# Patient Record
Sex: Male | Born: 1945 | Race: White | Hispanic: No | Marital: Married | Smoking: Former smoker
Health system: Southern US, Community
[De-identification: ages and names within clinical notes are randomized; demographics above are authoritative.]

## PROBLEM LIST (undated history)

## (undated) DIAGNOSIS — K219 Gastro-esophageal reflux disease without esophagitis: Secondary | ICD-10-CM

## (undated) DIAGNOSIS — K760 Fatty (change of) liver, not elsewhere classified: Secondary | ICD-10-CM

## (undated) DIAGNOSIS — K5790 Diverticulosis of intestine, part unspecified, without perforation or abscess without bleeding: Secondary | ICD-10-CM

## (undated) DIAGNOSIS — D126 Benign neoplasm of colon, unspecified: Secondary | ICD-10-CM

## (undated) DIAGNOSIS — K8020 Calculus of gallbladder without cholecystitis without obstruction: Secondary | ICD-10-CM

## (undated) DIAGNOSIS — R160 Hepatomegaly, not elsewhere classified: Secondary | ICD-10-CM

## (undated) DIAGNOSIS — R55 Syncope and collapse: Secondary | ICD-10-CM

## (undated) DIAGNOSIS — Z8679 Personal history of other diseases of the circulatory system: Secondary | ICD-10-CM

## (undated) DIAGNOSIS — I38 Endocarditis, valve unspecified: Secondary | ICD-10-CM

## (undated) DIAGNOSIS — E785 Hyperlipidemia, unspecified: Secondary | ICD-10-CM

## (undated) DIAGNOSIS — E119 Type 2 diabetes mellitus without complications: Secondary | ICD-10-CM

## (undated) HISTORY — DX: Hyperlipidemia, unspecified: E78.5

## (undated) HISTORY — DX: Gastro-esophageal reflux disease without esophagitis: K21.9

## (undated) HISTORY — DX: Fatty (change of) liver, not elsewhere classified: K76.0

## (undated) HISTORY — PX: MOUTH SURGERY: SHX715

## (undated) HISTORY — DX: Type 2 diabetes mellitus without complications: E11.9

## (undated) HISTORY — DX: Endocarditis, valve unspecified: I38

## (undated) HISTORY — PX: ESOPHAGOGASTRODUODENOSCOPY: SHX1529

## (undated) HISTORY — DX: Syncope and collapse: R55

## (undated) HISTORY — DX: Calculus of gallbladder without cholecystitis without obstruction: K80.20

## (undated) HISTORY — PX: FINGER SURGERY: SHX640

## (undated) HISTORY — DX: Hepatomegaly, not elsewhere classified: R16.0

## (undated) HISTORY — DX: Benign neoplasm of colon, unspecified: D12.6

## (undated) HISTORY — DX: Personal history of other diseases of the circulatory system: Z86.79

## (undated) HISTORY — DX: Diverticulosis of intestine, part unspecified, without perforation or abscess without bleeding: K57.90

---

## 2006-02-04 ENCOUNTER — Encounter: Payer: Self-pay | Admitting: Internal Medicine

## 2009-02-27 ENCOUNTER — Encounter: Payer: Self-pay | Admitting: Internal Medicine

## 2009-02-27 LAB — HM COLONOSCOPY

## 2010-02-21 ENCOUNTER — Other Ambulatory Visit: Payer: Self-pay | Admitting: Internal Medicine

## 2010-02-21 ENCOUNTER — Ambulatory Visit
Admission: RE | Admit: 2010-02-21 | Discharge: 2010-02-21 | Payer: Self-pay | Source: Home / Self Care | Attending: Internal Medicine | Admitting: Internal Medicine

## 2010-02-21 ENCOUNTER — Encounter: Payer: Self-pay | Admitting: Internal Medicine

## 2010-02-21 DIAGNOSIS — I38 Endocarditis, valve unspecified: Secondary | ICD-10-CM | POA: Insufficient documentation

## 2010-02-21 DIAGNOSIS — E118 Type 2 diabetes mellitus with unspecified complications: Secondary | ICD-10-CM | POA: Insufficient documentation

## 2010-02-21 DIAGNOSIS — R55 Syncope and collapse: Secondary | ICD-10-CM | POA: Insufficient documentation

## 2010-02-21 DIAGNOSIS — E785 Hyperlipidemia, unspecified: Secondary | ICD-10-CM | POA: Insufficient documentation

## 2010-02-21 DIAGNOSIS — Z8679 Personal history of other diseases of the circulatory system: Secondary | ICD-10-CM | POA: Insufficient documentation

## 2010-02-21 DIAGNOSIS — K219 Gastro-esophageal reflux disease without esophagitis: Secondary | ICD-10-CM | POA: Insufficient documentation

## 2010-02-21 DIAGNOSIS — D126 Benign neoplasm of colon, unspecified: Secondary | ICD-10-CM | POA: Insufficient documentation

## 2010-02-21 LAB — BASIC METABOLIC PANEL
BUN: 15 mg/dL (ref 6–23)
CO2: 28 mEq/L (ref 19–32)
Calcium: 9.2 mg/dL (ref 8.4–10.5)
Chloride: 102 mEq/L (ref 96–112)
Creatinine, Ser: 1 mg/dL (ref 0.4–1.5)
GFR: 83.09 mL/min (ref >60.00)
Glucose, Bld: 137 mg/dL — ABNORMAL HIGH (ref 70–99)
Potassium: 4.5 mEq/L (ref 3.5–5.1)
Sodium: 138 mEq/L (ref 135–145)

## 2010-02-21 LAB — HEPATIC FUNCTION PANEL
ALT: 70 U/L — ABNORMAL HIGH (ref 0–53)
AST: 48 U/L — ABNORMAL HIGH (ref 0–37)
Albumin: 3.9 g/dL (ref 3.5–5.2)
Alkaline Phosphatase: 106 U/L (ref 39–117)
Bilirubin, Direct: 0.1 mg/dL (ref 0.0–0.3)
Total Bilirubin: 0.8 mg/dL (ref 0.3–1.2)
Total Protein: 6.7 g/dL (ref 6.0–8.3)

## 2010-02-21 LAB — CBC WITH DIFFERENTIAL/PLATELET
Basophils Absolute: 0.1 10*3/uL (ref 0.0–0.1)
Basophils Relative: 0.9 % (ref 0.0–3.0)
Eosinophils Absolute: 0.3 10*3/uL (ref 0.0–0.7)
Eosinophils Relative: 3 % (ref 0.0–5.0)
HCT: 47.6 % (ref 39.0–52.0)
Hemoglobin: 16.2 g/dL (ref 13.0–17.0)
Lymphocytes Relative: 33.1 % (ref 12.0–46.0)
Lymphs Abs: 3.1 10*3/uL (ref 0.7–4.0)
MCHC: 34 g/dL (ref 30.0–36.0)
MCV: 92.6 fl (ref 78.0–100.0)
Monocytes Absolute: 1 10*3/uL (ref 0.1–1.0)
Monocytes Relative: 10.7 % (ref 3.0–12.0)
Neutro Abs: 5 10*3/uL (ref 1.4–7.7)
Neutrophils Relative %: 52.3 % (ref 43.0–77.0)
Platelets: 145 10*3/uL — ABNORMAL LOW (ref 150.0–400.0)
RBC: 5.14 Mil/uL (ref 4.22–5.81)
RDW: 13.2 % (ref 11.5–14.6)
WBC: 9.5 10*3/uL (ref 4.5–10.5)

## 2010-02-21 LAB — LDL CHOLESTEROL, DIRECT: Direct LDL: 169.8 mg/dL

## 2010-02-21 LAB — HEMOGLOBIN A1C: Hgb A1c MFr Bld: 8.6 % — ABNORMAL HIGH (ref 4.6–6.5)

## 2010-02-21 LAB — LIPID PANEL
Cholesterol: 238 mg/dL — ABNORMAL HIGH (ref 0–200)
HDL: 48.2 mg/dL (ref >39.00)
Total CHOL/HDL Ratio: 5
Triglycerides: 129 mg/dL (ref 0.0–149.0)
VLDL: 25.8 mg/dL (ref 0.0–40.0)

## 2010-02-21 LAB — PSA: PSA: 0.57 ng/mL (ref 0.10–4.00)

## 2010-02-21 LAB — TSH: TSH: 1.52 u[IU]/mL (ref 0.35–5.50)

## 2010-02-25 ENCOUNTER — Telehealth (INDEPENDENT_AMBULATORY_CARE_PROVIDER_SITE_OTHER): Payer: Self-pay | Admitting: *Deleted

## 2010-03-07 ENCOUNTER — Ambulatory Visit
Admission: RE | Admit: 2010-03-07 | Discharge: 2010-03-07 | Payer: Self-pay | Source: Home / Self Care | Attending: Internal Medicine | Admitting: Internal Medicine

## 2010-03-20 NOTE — Progress Notes (Signed)
  Phone Note Other Incoming   Request: Send information Summary of Call: Records received from [facility]. 7 pages forwarded to Dr. Debby Bud for review.

## 2010-03-20 NOTE — Assessment & Plan Note (Signed)
Summary: NEW PT/SECURE HORIZONS/#/LB   Vital Signs:  Patient profile:   65 year old male Height:      73 inches Weight:      198 pounds BMI:     26.22 O2 Sat:      96 % on Room air Temp:     97.8 degrees F oral Pulse rate:   76 / minute BP sitting:   122 / 82  (left arm) Cuff size:   regular  Vitals Entered By: Charlynne Cousins CMA (February 21, 2010 1:38 PM)  O2 Flow:  Room air CC: new pt here to est care with primary/ ab  Vision Screening:      Vision Comments: last eye exam 2009 normal   Primary Care Provider:  Patient presents to establMichael E Verlon Pischke MD  CC:  new pt here to est care with primary/ ab.  History of Present Illness: ish for on-going continuity care. He has not active complaints at todays viisit.  He has been feeling well and doing well: no limitations in activity.   Preventive Screening-Counseling & Management  Alcohol-Tobacco     Alcohol drinks/day: <1     Alcohol type: beer     >5/day in last 3 mos: yes     Smoking Status: quit     Packs/Day: 1.0     Year Quit: 1980  Caffeine-Diet-Exercise     Caffeine use/day: 1 cup      Diet Comments: routine regular diet     Does Patient Exercise: no     Exercise Counseling: to improve exercise regimen  Hep-HIV-STD-Contraception     Hepatitis Risk: no risk noted     HIV Risk: no risk noted     STD Risk: no risk noted     Dental Visit-last 6 months no     TSE monthly: no     Sun Exposure-Excessive: no  Safety-Violence-Falls     Seat Belt Use: yes     Helmet Use: n/a     Firearms in the Home: no firearms in the home     Smoke Detectors: yes     Violence in the Home: no risk noted     Sexual Abuse: no     Fall Risk: low fall risk      Sexual History:  currently monogamous.        Drug Use:  never.        Blood Transfusions:  no.    Current Medications (verified): 1)  None  Allergies (verified): No Known Drug Allergies  Past History:  Past Medical History: UCD COLONIC POLYPS  (ICD-211.3) HEART VALVE DISEASE (ICD-424.90) RHEUMATIC FEVER, HX OF (ICD-V12.59) GERD (ICD-530.81) Hx of SYNCOPE AND COLLAPSE (ICD-780.2)   Physician Roster:                 GI--Dr.Rhodan (([facility])  Past Surgical History: none  Family History: Father - deceased @ 72: Anal cancer; CAD Mother- 45: CAD, AICD/pacer, DM,HTN, lipids Neg- prostate cancer MUncle - colon cancer 2nd degree - stomach cancer 2nd degree kin with DM  Social History: HS[employer], community college 2 years [employer] - 3 years, E5 at discharge [employer] '65 1 s0n'70 , 1 dtr '69; 2 grandchildren 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 Smoking Status:  quit Packs/Day:  1.0 Caffeine use/day:  1 cup  Does Patient Exercise:  no Dental Care w/in 6 mos.:  no Sun Exposure-Excessive:  no Seat Belt Use:  yes Fall Risk:  low fall risk  Blood Transfusions:  no Hepatitis Risk:  no risk noted HIV Risk:  no risk noted STD Risk:  no risk noted Sexual History:  currently monogamous Drug Use:  never  Review of Systems       The patient complains of decreased hearing.  The patient denies anorexia, fever, weight loss, weight gain, vision loss, hoarseness, chest pain, syncope, dyspnea on exertion, peripheral edema, headaches, abdominal pain, severe indigestion/heartburn, incontinence, muscle weakness, suspicious skin lesions, difficulty walking, depression, unusual weight change, abnormal bleeding, angioedema, and testicular masses.         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   Physical Exam  General:  Tall, alert, well-developed, well-nourished, well-hydrated, and healthy-appearing.   Head:  Normocephalic and atraumatic without obvious abnormalities. No apparent alopecia or balding. Eyes:  No corneal or conjunctival inflammation noted. EOMI. Perrla. Funduscopic exam benign, without hemorrhages, exudates or papilledema. Vision grossly normal. Ears:  External ear exam shows no  significant lesions or deformities.  Otoscopic examination reveals clear canals, tympanic membranes are intact bilaterally without bulging, retraction, inflammation or discharge. Hearing is grossly normal bilaterally. Nose:  no external deformity, no external erythema, and no nasal discharge.   Mouth:  no oral lesions, posterior pharynx clear Neck:  supple, full ROM, no thyromegaly, and no carotid bruits.   Chest Wall:  No deformities, masses, tenderness or gynecomastia noted. Lungs:  Normal respiratory effort, chest expands symmetrically. Lungs are clear to auscultation, no crackles or wheezes. Heart:  Normal rate and regular rhythm. S1 and S2 normal without gallop, murmur, click, rub or other extra sounds. Abdomen:  soft, non-tender, normal bowel sounds, no guarding, and no hepatomegaly.   Prostate:  deferred Msk:  normal ROM, no joint tenderness, no joint swelling, no joint warmth, no redness over joints, and no joint instability.   Pulses:  2+ radial pulse Extremities:  No clubbing, cyanosis, edema, or deformity noted with normal full range of motion of all joints.   Neurologic:  alert & oriented X3, cranial nerves II-XII intact, strength normal in all extremities, gait normal, and DTRs symmetrical and normal.   Skin:  turgor normal, color normal, no rashes, no suspicious lesions, and no ulcerations.   Cervical Nodes:  no anterior cervical adenopathy and no posterior cervical adenopathy.   Axillary Nodes:  no R axillary adenopathy and no L axillary adenopathy.   Inguinal Nodes:  no R inguinal adenopathy and no L inguinal adenopathy.   Psych:  Oriented X3, memory intact for recent and remote, normally interactive, good eye contact, and not anxious appearing.     Impression & Recommendations:  Problem # 1:  DIABETES MELLITUS, TYPE II, UNCONTROLLED (ICD-250.02) Patient had previously been told he was diabetic but he never undertook treatment.  Plan - A1c for diagnosis  Addendum - A1C 8.6%  - making diagnosis of diabetes with out of control blood sugar.  Plan - return office visit to discuss diagnosis and treatment.  Problem # 2:  HYPERLIPIDEMIA (NIO-270.4) No prior history of problem with cholesterol levels.   Plan - routine lab.  Addendum: LDL 169.8 with a goal of 130 in low risk patient, goal of 100 or less in diabetic patient  Plan - return office visit to discuss diagnosis and treatment options.   Problem # 3:  HEART VALVE DISEASE (ICD-424.90) Had diagnosis of rheumatic fever and heart murmur made while in service. He reports he was hospitalized for 6 weeks. He has never had any subsequent problems. No recollection of  2 D echo. On exam no murmur appreciated.  Plan - close observation with a low threshold for 2D echo.  Problem # 4:  COLONIC POLYPS (ICD-211.3) He believes his last study was normal.  Plan - obtain old records with recommendations to follow.   Problem # 5:  Preventive Health Care (ICD-V70.0) No active medical complaints at today's visit. Physical exam was normal. Lab reveals hyperglycemia and elevated AqC = diabetes; elevated cholesterol levels; mild elevaton in liver functions which will require further lab evaluation: chronic hepatitis panel. Colorectal cancer screeing - will review previous colonoscopies and determine interval for repeat study. Prostate cancer screening - PSA normal. Immuniations - requested patient to obtain old records with recommendations for immunizations to follow. 12 lead EKG normal with no signs of ischemia or injury.  In summary - a nice man establising for on-going care who does have a couple of problems that need to be addressed. He will return for follow up office visit.  Orders: Medicare -1st Annual Wellness Visit [phone]) TLB-CBC Platelet - w/Differential (85025-CBCD) TLB-BMP (Basic Metabolic Panel-BMET) (00938-HWEXHBZ) TLB-Lipid Panel (80061-LIPID) TLB-Hepatic/Liver Function Pnl (80076-HEPATIC) TLB-TSH (Thyroid  Stimulating Hormone) (84443-TSH) TLB-PSA (Prostate Specific Antigen) (84153-PSA) TLB-A1C / Hgb A1C (Glycohemoglobin) (83036-A1C)  Patient: Angel Rodgers Note: All result statuses are Final unless otherwise noted.  Tests: (1) CBC Platelet w/Diff (CBCD)   White Cell Count          9.5 K/uL                    4.5-10.5   Red Cell Count            5.14 Mil/uL                 4.22-5.81   Hemoglobin                16.2 g/dL                   13.0-17.0   Hematocrit                47.6 %                      39.0-52.0   MCV                       92.6 fl                     78.0-100.0   MCHC                      34.0 g/dL                   30.0-36.0   RDW                       13.2 %                      11.5-14.6   Platelet Count       [L]  145.0 K/uL                  150.0-400.0   Neutrophil %              52.3 %                      43.0-77.0   Lymphocyte %  33.1 %                      12.0-46.0   Monocyte %                10.7 %                      3.0-12.0   Eosinophils%              3.0 %                       0.0-5.0   Basophils %               0.9 %                       0.0-3.0   Neutrophill Absolute      5.0 K/uL                    1.4-7.7   Lymphocyte Absolute       3.1 K/uL                    0.7-4.0   Monocyte Absolute         1.0 K/uL                    0.1-1.0  Eosinophils, Absolute                             0.3 K/uL                    0.0-0.7   Basophils Absolute        0.1 K/uL                    0.0-0.1  Tests: (2) BMP (METABOL)   Sodium                    138 mEq/L                   135-145   Potassium                 4.5 mEq/L                   3.5-5.1   Chloride                  102 mEq/L                   96-112   Carbon Dioxide            28 mEq/L                    19-32   Glucose              [H]  137 mg/dL                   70-99   BUN                       15 mg/dL                    6-23   Creatinine  1.0 mg/dL                    0.4-1.5   Calcium                   9.2 mg/dL                   8.4-10.5   GFR                       83.09 mL/min                >60.00  Tests: (3) Lipid Panel (LIPID)   Cholesterol          [H]  238 mg/dL                   0-200     ATP III Classification            Desirable:  < 200 mg/dL                    Borderline High:  200 - 239 mg/dL               High:  > = 240 mg/dL   Triglycerides             129.0 mg/dL                 0.0-149.0     Normal:  <150 mg/dL     Borderline High:  150 - 199 mg/dL   HDL                       48.20 mg/dL                 >39.00   VLDL Cholesterol          25.8 mg/dL                  0.0-40.0  CHO/HDL Ratio:  CHD Risk                             5                    Men          Women     1/2 Average Risk     3.4          3.3     Average Risk          5.0          4.4     2X Average Risk          9.6          7.1     3X Average Risk          15.0          11.0                           Tests: (4) Hepatic/Liver Function Panel (HEPATIC)   Total Bilirubin           0.8 mg/dL                   0.3-1.2   Direct Bilirubin          0.1 mg/dL  0.0-0.3   Alkaline Phosphatase      106 U/L                     39-117   AST                  [H]  48 U/L                      0-37   ALT                  [H]  70 U/L                      0-53   Total Protein             6.7 g/dL                    6.0-8.3   Albumin                   3.9 g/dL                    3.5-5.2  Tests: (5) TSH (TSH)   FastTSH                   1.52 uIU/mL                 0.35-5.50  Tests: (6) Prostate Specific Antigen (PSA)   PSA-Hyb                   0.57 ng/mL                  0.10-4.00  Tests: (7) Hemoglobin A1C (A1C)   Hemoglobin A1C       [H]  8.6 %                       4.6-6.5     Glycemic Control Guidelines for People with Diabetes:     Non Diabetic:  <6%     Goal of Therapy: <7%     Additional Action Suggested:  >8%   Tests: (8) Cholesterol LDL - Direct  (DIRLDL)  Cholesterol LDL - Direct                             169.8 mg/dL     Optimal:  <100 mg/dL     Near or Above Optimal:  100-129 mg/dL     Borderline High:  130-159 mg/dL     High:  160-189 mg/dL     Very High:  >190 mg/dL  Orders Added: 1)  Medicare -1st Annual Wellness Visit [G0438] 2)  TLB-CBC Platelet - w/Differential [85025-CBCD] 3)  TLB-BMP (Basic Metabolic Panel-BMET) [93716-RCVELFY] 4)  TLB-Lipid Panel [80061-LIPID] 5)  TLB-Hepatic/Liver Function Pnl [80076-HEPATIC] 6)  TLB-TSH (Thyroid Stimulating Hormone) [84443-TSH] 7)  TLB-PSA (Prostate Specific Antigen) [84153-PSA] 8)  TLB-A1C / Hgb A1C (Glycohemoglobin) [83036-A1C]  Appended Document: NEW PT/SECURE HORIZONS/#/LB    Clinical Lists Changes  Orders: Added new Service order of Tdap => 33yr IM ((10175 - Signed Added new Service order of Admin 1st Vaccine ([id number] - Signed Observations: Added new observation of TD BOOST VIS: 01/04/08 version given March 07, 2010. (03/07/2010 11:04) Added new observation of TD BOOSTERLO: [id number] (03/07/2010 11:04) Added new observation of TD BOOST EXP: 12/06/2011 (03/07/2010 11:04) Added new  observation of TD BOOSTERBY: Ami Bullins CMA (03/07/2010 11:04) Added new observation of TD BOOSTERRT: IM (03/07/2010 11:04) Added new observation of TDBOOSTERDSE: 0.5 ml (03/07/2010 11:04) Added new observation of TD BOOSTERMF: GlaxoSmithKline (03/07/2010 11:04) Added new observation of TD BOOST SIT: left deltoid (03/07/2010 11:04) Added new observation of TD BOOSTER: Tdap (03/07/2010 11:04)       Immunizations Administered:  Tetanus Vaccine:    Vaccine Type: Tdap    Site: left deltoid    Mfr: GlaxoSmithKline    Dose: 0.5 ml    Route: IM    Given by: Ami Bullins CMA    Exp. Date: 12/06/2011    Lot #: [id number]    VIS given: 01/04/08 version given March 07, 2010.

## 2010-03-20 NOTE — Procedures (Signed)
Summary: Colonoscopy/[facility]  Colonoscopy/[facility]   Imported By: Phillis Knack 03/04/2010 08:42:09  _____________________________________________________________________  External Attachment:    Type:   Image     Comment:   External Document

## 2010-03-20 NOTE — Assessment & Plan Note (Signed)
Summary: discuss labs per flag/cd   Vital Signs:  Patient profile:   65 year old male Height:      73 inches Weight:      195 pounds BMI:     25.82 O2 Sat:      97 % on Room air Temp:     97.9 degrees F oral Pulse rate:   63 / minute BP sitting:   110 / 68  (left arm) Cuff size:   regular  Vitals Entered By: Ami Bullins CMA (March 07, 2010 11:02 AM)  O2 Flow:  Room air CC: office visit to discuss labs/ ab   Primary Care Provider:  Patient presents to establMichael E Sharyon Peitz MD  CC:  office visit to discuss labs/ ab.  History of Present Illness: Mr. Akamine was recenly seen as a new patient. The H&P was reviewed and he had no additions or corrections.   Patient returns for follow-up of abnormal labs. A1C 8.6%; LDL 169. He is feeling well and is asymptomatic.  Current Medications (verified): 1)  None  Allergies (verified): No Known Drug Allergies PMH-FH-SH reviewed-no changes except otherwise noted  Review of Systems Endo:  Denies cold intolerance, excessive hunger, excessive thirst, excessive urination, polyuria, and weight change.  Physical Exam  General:  alert, well-developed, well-nourished, and well-hydrated.   Lungs:  normal respiratory effort and normal breath sounds.   Heart:  normal rate and regular rhythm.   Psych:  Oriented X3, memory intact for recent and remote, normally interactive, and good eye contact.     Impression & Recommendations:  Problem # 1:  DIABETES MELLITUS, TYPE II, UNCONTROLLED (ICD-250.02) By definition, with an A1C greater than 7%, he is a diabetic. Discussed with him in detail. Reviewed mechanism of disease. Reviewed treatment options. He does admit to eating a lot of sugar.  Plan - by his preference - life style management: no sugar, low carb diet.           follow-up A1C in 3 months with treatment if not a goal of A1C 7% or less  Labs Reviewed: Creat: 1.0 (02/21/2010)    Reviewed HgBA1c results: 8.6 (02/21/2010)  Problem #  2:  HYPERLIPIDEMIA (ICD-272.4) Labs Reviewed: SGOT: 48 (02/21/2010)   SGPT: 70 (02/21/2010)   HDL:48.20 (02/21/2010)  Chol:238 (02/21/2010)  Trig:129.0 (02/21/2010) LDL 169 (02/21/2010)  Reviewed the NCEP guidelines: for a diabetic the goal is an LDL of less than 100. discussed the increased risk of cardiovascular disease.  Plan - by his preference a trial of lifestyle management: low fat diet and increased aerobic exercise.           repeat lipid profile in 3 months.    Orders Added: 1)  Est. Patient Level III [12248]

## 2010-03-20 NOTE — Procedures (Signed)
Summary: Colonoscopy/[facility]  Colonoscopy/[facility]   Imported By: Phillis Knack 03/04/2010 08:41:03  _____________________________________________________________________  External Attachment:    Type:   Image     Comment:   External Document

## 2010-06-09 ENCOUNTER — Other Ambulatory Visit: Payer: Self-pay

## 2010-06-09 ENCOUNTER — Other Ambulatory Visit: Payer: Self-pay | Admitting: Internal Medicine

## 2010-06-09 DIAGNOSIS — E785 Hyperlipidemia, unspecified: Secondary | ICD-10-CM

## 2010-06-09 DIAGNOSIS — E119 Type 2 diabetes mellitus without complications: Secondary | ICD-10-CM

## 2010-07-02 ENCOUNTER — Other Ambulatory Visit (INDEPENDENT_AMBULATORY_CARE_PROVIDER_SITE_OTHER): Payer: Self-pay

## 2010-07-02 DIAGNOSIS — E119 Type 2 diabetes mellitus without complications: Secondary | ICD-10-CM

## 2010-07-02 DIAGNOSIS — E785 Hyperlipidemia, unspecified: Secondary | ICD-10-CM

## 2010-07-02 LAB — LIPID PANEL
Cholesterol: 169 mg/dL (ref 0–200)
HDL: 47.4 mg/dL (ref >39.00)
LDL Cholesterol: 113 mg/dL — ABNORMAL HIGH (ref 0–99)
VLDL: 8.4 mg/dL (ref 0.0–40.0)

## 2010-07-07 ENCOUNTER — Encounter: Payer: Self-pay | Admitting: Internal Medicine

## 2010-07-11 ENCOUNTER — Encounter: Payer: Self-pay | Admitting: *Deleted

## 2010-07-15 ENCOUNTER — Ambulatory Visit (INDEPENDENT_AMBULATORY_CARE_PROVIDER_SITE_OTHER): Payer: Medicare Other | Admitting: Internal Medicine

## 2010-07-15 VITALS — BP 110/80 | HR 60 | Temp 98.1°F | Wt 194.0 lb

## 2010-07-15 DIAGNOSIS — IMO0001 Reserved for inherently not codable concepts without codable children: Secondary | ICD-10-CM

## 2010-07-15 MED ORDER — METFORMIN HCL 500 MG PO TABS: 500.0000 mg | ORAL_TABLET | Freq: Two times a day (BID) | ORAL | Status: DC

## 2010-07-15 NOTE — Patient Instructions (Signed)
Diabetes - A1C 8.6%, way too high. Plan - start metformin 560m once a day for 10 days then increase to twice a day. Check blood sugar only as needed, i.e. Feeling bad and needing to rule out low blood sugar. Return in 90 days for repeat A1C with a goal of 7% or less. Continue to follow a sugar free, low carb diet.

## 2010-07-15 NOTE — Progress Notes (Signed)
  Subjective:    Patient ID: Trevelle Mcgurn, male    DOB: 12-01-1945, 65 y.o.   MRN: [id number]  HPI Mr. Lotter recently seen as a new patient, please see report. In the interval he had lab work which revealed an LDL of 113 - close to goal of 100 or less and an AQ1C of 8.6%. He presents to discuss these results.  I have reviewed the patient's medical history in detail and updated the computerized patient record.    Review of Systems No change from recent visit    Objective:   Physical Exam Vitals noted WNWD WM in no distress Pul - normal respirations Cor - RRR       Assessment & Plan:  1. Hyperlipidemia - sub-optimal control.  Plan - diet management at this time  2. Diabetes- out of control on diet therapy alone.  Plan - metformin 575m qd x 10 then bid. Reassess A1C in 3 months.   (greater than 50% of 2o min  visit spent on education and counseling)

## 2010-12-26 ENCOUNTER — Other Ambulatory Visit (INDEPENDENT_AMBULATORY_CARE_PROVIDER_SITE_OTHER): Payer: Medicare Other

## 2010-12-26 ENCOUNTER — Ambulatory Visit (INDEPENDENT_AMBULATORY_CARE_PROVIDER_SITE_OTHER): Payer: Medicare Other | Admitting: Internal Medicine

## 2010-12-26 VITALS — BP 120/70 | HR 70 | Temp 97.5°F | Wt 191.0 lb

## 2010-12-26 LAB — COMPREHENSIVE METABOLIC PANEL
ALT: 59 U/L — ABNORMAL HIGH (ref 0–53)
AST: 38 U/L — ABNORMAL HIGH (ref 0–37)
CO2: 27 mEq/L (ref 19–32)
Calcium: 10 mg/dL (ref 8.4–10.5)
Chloride: 104 mEq/L (ref 96–112)
GFR: 85.97 mL/min (ref >60.00)
Sodium: 139 mEq/L (ref 135–145)
Total Protein: 7.3 g/dL (ref 6.0–8.3)

## 2010-12-26 LAB — HEMOGLOBIN A1C: Hgb A1c MFr Bld: 7.1 % — ABNORMAL HIGH (ref 4.6–6.5)

## 2010-12-26 NOTE — Progress Notes (Signed)
  Subjective:    Patient ID: Angel Rodgers, male    DOB: 1945/07/06, 65 y.o.   MRN: [id number]  HPI Patient returns for follow-up. He was last seen May 29th He had an elevated A1C and was to return for lab . He was directed by schedulers to have an office visit. No charge for visit.    Review of Systems     Objective:   Physical Exam        Assessment & Plan:

## 2010-12-28 ENCOUNTER — Encounter: Payer: Self-pay | Admitting: Internal Medicine

## 2011-06-09 ENCOUNTER — Telehealth: Payer: Self-pay | Admitting: Internal Medicine

## 2011-06-09 DIAGNOSIS — IMO0001 Reserved for inherently not codable concepts without codable children: Secondary | ICD-10-CM

## 2011-06-09 DIAGNOSIS — E785 Hyperlipidemia, unspecified: Secondary | ICD-10-CM

## 2011-06-09 NOTE — Telephone Encounter (Signed)
Orders entered for lipid, hepatic, A1C and Bmet

## 2011-06-09 NOTE — Telephone Encounter (Signed)
Pt's wife called to set up physicals.  Angel Rodgers is June 11.  She thinks he needs to come before then for his A1C.  Can he come for this now and do everything else in June.  Please call his cell or the home phone to let him know.

## 2011-06-16 NOTE — Telephone Encounter (Signed)
Pt is aware.  

## 2011-06-23 ENCOUNTER — Other Ambulatory Visit (INDEPENDENT_AMBULATORY_CARE_PROVIDER_SITE_OTHER): Payer: Medicare Other

## 2011-06-23 DIAGNOSIS — E785 Hyperlipidemia, unspecified: Secondary | ICD-10-CM

## 2011-06-23 DIAGNOSIS — IMO0001 Reserved for inherently not codable concepts without codable children: Secondary | ICD-10-CM

## 2011-06-23 LAB — COMPREHENSIVE METABOLIC PANEL
ALT: 37 U/L (ref 0–53)
Albumin: 3.6 g/dL (ref 3.5–5.2)
Alkaline Phosphatase: 69 U/L (ref 39–117)
CO2: 25 mEq/L (ref 19–32)
Glucose, Bld: 127 mg/dL — ABNORMAL HIGH (ref 70–99)
Potassium: 4.4 mEq/L (ref 3.5–5.1)
Sodium: 138 mEq/L (ref 135–145)
Total Bilirubin: 0.6 mg/dL (ref 0.3–1.2)
Total Protein: 6.8 g/dL (ref 6.0–8.3)

## 2011-06-23 LAB — HEPATIC FUNCTION PANEL
ALT: 37 U/L (ref 0–53)
AST: 28 U/L (ref 0–37)
Albumin: 3.6 g/dL (ref 3.5–5.2)
Total Bilirubin: 0.6 mg/dL (ref 0.3–1.2)
Total Protein: 6.8 g/dL (ref 6.0–8.3)

## 2011-06-23 LAB — LIPID PANEL
Total CHOL/HDL Ratio: 4
VLDL: 9.8 mg/dL (ref 0.0–40.0)

## 2011-06-23 LAB — HEMOGLOBIN A1C: Hgb A1c MFr Bld: 7.4 % — ABNORMAL HIGH (ref 4.6–6.5)

## 2011-07-01 ENCOUNTER — Other Ambulatory Visit: Payer: Self-pay | Admitting: Internal Medicine

## 2011-07-28 ENCOUNTER — Encounter: Payer: Self-pay | Admitting: Internal Medicine

## 2011-07-28 ENCOUNTER — Ambulatory Visit (INDEPENDENT_AMBULATORY_CARE_PROVIDER_SITE_OTHER): Payer: Medicare Other | Admitting: Internal Medicine

## 2011-07-28 VITALS — BP 118/70 | HR 80 | Temp 98.1°F | Resp 16 | Ht 72.0 in | Wt 190.0 lb

## 2011-07-28 DIAGNOSIS — Z0000 Encounter for general adult medical examination without abnormal findings: Secondary | ICD-10-CM

## 2011-07-28 DIAGNOSIS — I059 Rheumatic mitral valve disease, unspecified: Secondary | ICD-10-CM

## 2011-07-28 DIAGNOSIS — Z8679 Personal history of other diseases of the circulatory system: Secondary | ICD-10-CM

## 2011-07-28 DIAGNOSIS — E785 Hyperlipidemia, unspecified: Secondary | ICD-10-CM

## 2011-07-28 MED ORDER — ATORVASTATIN CALCIUM 20 MG PO TABS: 20.0000 mg | ORAL_TABLET | Freq: Every day | ORAL | Status: DC

## 2011-07-28 NOTE — Assessment & Plan Note (Signed)
Lab Results  Component Value Date   HGBA1C 7.4* 06/23/2011   Plan - Life-style management for next 3 months  Repeat A1C 3 months

## 2011-08-03 ENCOUNTER — Encounter: Payer: Self-pay | Admitting: Internal Medicine

## 2011-08-03 DIAGNOSIS — Z0000 Encounter for general adult medical examination without abnormal findings: Secondary | ICD-10-CM | POA: Insufficient documentation

## 2011-08-03 NOTE — Assessment & Plan Note (Signed)
LDL not a goal for a diabetic - LDL of 100 or less  Plan Life-style management with low fat diet and exercise.  Start medical therapy given the increased risk of cardiac disease. Lipitor 20 mg daily  Follow up lab in 4 weeks.

## 2011-08-03 NOTE — Assessment & Plan Note (Signed)
Mild murmur present - mitral area.  Plan 2D echo to assess valves. R/o rheumatic fever related mitral regurge.

## 2011-08-03 NOTE — Assessment & Plan Note (Signed)
Patient appears medically stable. Physical exam does reveal a tan mole with irregular edge on head/face that has been stable. Lab reveals suboptimal A1c and LDL cholesterol with patient preference for life-style management of diabetes before increasing medication and a willingness to start "statin" therapy with close follow-up. He is current for colorectal cancer screening. Discussed pros and cons of prostate cancer screening (USPHCTF recommendations reviewed and ACU April '13 recommendations) and he defers retesting at this time with a normal PSA in '12  In summary - a nice man who needs to be brought into better control in regard to diabetes and hyperlipidemia.

## 2011-08-03 NOTE — Progress Notes (Signed)
Subjective:    Patient ID: Angel Rodgers, male    DOB: 10-12-1945, 66 y.o.   MRN: [id number]  HPI The patient is here for annual Medicare wellness examination and management of other chronic and acute problems. He is follow for diabetes and hyperlipidemia.   The risk factors are reflected in the social history.  The roster of all physicians providing medical care to patient - is listed in the Snapshot section of the chart.  Activities of daily living:  The patient is 100% inedpendent in all ADLs: dressing, toileting, feeding as well as independent mobility  Home safety : The patient has smoke detectors in the home. They wear seatbelts.  firearms are present in the home, kept in a safe fashion. There is no violence in the home.   There is no risks for hepatitis, STDs or HIV. There is no   history of blood transfusion. They have no travel history to infectious disease endemic areas of the world.  The patient has seen their dentist in the last six month. They have seen their eye doctor in the last year. They deny  any hearing difficulty and have not had audiologic testing in the last year.  They do not  have excessive sun exposure. Discussed the need for sun protection: hats, long sleeves and use of sunscreen if there is significant sun exposure.   Diet: the importance of a healthy diet is discussed. They try to  have a healthy diet.  The patient has no regular exercise program.  The benefits of regular aerobic exercise were discussed.  Depression screen: there are no signs or vegative symptoms of depression- irritability, change in appetite, anhedonia, sadness/tearfullness.  Cognitive assessment: the patient manages all their financial and personal affairs and is actively engaged. .  The following portions of the patient's history were reviewed and updated as appropriate: allergies, current medications, past family history, past medical history,  past surgical history, past social history   and problem list.  Vision, hearing, body mass index were assessed and reviewed.   During the course of the visit the patient was educated and counseled about appropriate screening and preventive services including : fall prevention , diabetes screening, nutrition counseling, colorectal cancer screening, and recommended immunizations.  Past Medical History  Diagnosis Date  . Benign neoplasm of colon   . Endocarditis, valve unspecified, unspecified cause   . History of rheumatic fever   . GERD (gastroesophageal reflux disease)   . Syncope and collapse    No past surgical history on file. Family History  Problem Relation Age of Onset  . Colon cancer Maternal Uncle   . Coronary artery disease Mother     AICD/PACER  . Hypertension Mother   . Diabetes Mother   . Hyperlipidemia Mother   . Coronary artery disease Father     AICD Pacer  . Cancer Father     ANAL  . Prostate cancer Neg Hx   . Stomach cancer Other   . Diabetes Other    History   Social History  . Marital Status: Married    Spouse Name: N/A    Number of Children: 2  . Years of Education: 12   Occupational History  . driver    Social History Main Topics  . Smoking status: Former Research scientist (life sciences)  . Smokeless tobacco: Never Used  . Alcohol Use: Yes  . Drug Use: No  . Sexually Active: Not on file   Other Topics Concern  . Not on file  Social History Narrative   HSG, community college 2 years. [employer]-3 years, E5 at discharge. Married  '65. 1 son '70, 1 dtr '69; 2 grandchildren. Work- drove Theatre manager now retired   Current Outpatient Prescriptions on File Prior to Visit  Medication Sig Dispense Refill  . aspirin 81 MG EC tablet Take 81 mg by mouth daily.        . metFORMIN (GLUCOPHAGE) 500 MG tablet TAKE 1 TABLET (500 MG TOTAL) BY MOUTH 2 (TWO) TIMES DAILY WITH A MEAL.  60 tablet  5  . atorvastatin (LIPITOR) 20 MG tablet Take 1 tablet (20 mg total) by mouth daily.  30 tablet  3       Review of  Systems Constitutional:  Negative for fever, chills, activity change and unexpected weight change.  HEENT:  Negative for hearing loss, ear pain, congestion, neck stiffness and postnasal drip. Negative for sore throat or swallowing problems. Negative for dental complaints.   Eyes: Negative for vision loss or change in visual acuity.  Respiratory: Negative for chest tightness and wheezing. Negative for DOE.   Cardiovascular: Negative for chest pain or palpitations. No decreased exercise tolerance Gastrointestinal: No change in bowel habit. No bloating or gas. No reflux or indigestion Genitourinary: Negative for urgency, frequency, flank pain and difficulty urinating.  Musculoskeletal: Negative for myalgias, back pain, arthralgias and gait problem.  Neurological: Negative for dizziness, tremors, weakness and headaches.  Hematological: Negative for adenopathy.  Psychiatric/Behavioral: Negative for behavioral problems and dysphoric mood.       Objective:   Physical Exam Filed Vitals:   07/28/11 1451  BP: 118/70  Pulse: 80  Temp: 98.1 F (36.7 C)  Resp: 16   Wt Readings from Last 3 Encounters:  07/28/11 190 lb (86.183 kg)  12/26/10 191 lb (86.637 kg)  07/15/10 194 lb (87.998 kg)    Gen'l: Well nourished well developed male in no acute distress  HEENT: Head: Normocephalic and atraumatic. Right Ear: External ear normal. EAC/TM nl. Left Ear: External ear normal.  EAC/TM nl. Nose: Nose normal. Mouth/Throat: Oropharynx is clear and moist. Dentition - native, in good repair. No buccal or palatal lesions. Posterior pharynx clear. Eyes: Conjunctivae and sclera clear. EOM intact. Pupils are equal, round, and reactive to light. Right eye exhibits no discharge. Left eye exhibits no discharge. Neck: Normal range of motion. Neck supple. No JVD present. No tracheal deviation present. No thyromegaly present.  Cardiovascular: Normal rate, regular rhythm, no gallop, no friction rub, no murmur heard.       Quiet precordium. 2+ radial and DP pulses . No carotid bruits Pulmonary/Chest: Effort normal. No respiratory distress or increased WOB, no wheezes, no rales. No chest wall deformity or CVAT. Abdominal: Soft. Bowel sounds are normal in all quadrants. He exhibits no distension, no tenderness, no rebound or guarding, No heptosplenomegaly  Genitourinary:   Musculoskeletal: Normal range of motion. He exhibits no edema and no tenderness.       Small and large joints without redness, synovial thickening or deformity. Full range of motion preserved about all small, median and large joints.  Lymphadenopathy:    He has no cervical or supraclavicular adenopathy.  Neurological: He is alert and oriented to person, place, and time. CN II-XII intact. DTRs 2+ and symmetrical biceps, radial and patellar tendons. Cerebellar function normal with no tremor, rigidity, normal gait and station. Foot w/ normal sensation Skin: Skin is warm and dry. No rash noted. No erythema.  Psychiatric: He has a normal mood and affect. His  behavior is normal. Thought content normal.   Lab Results  Component Value Date   WBC 9.5 02/21/2010   HGB 16.2 02/21/2010   HCT 47.6 02/21/2010   PLT 145.0* 02/21/2010   GLUCOSE 127* 06/23/2011   CHOL 184 06/23/2011   TRIG 49.0 06/23/2011   HDL 51.60 06/23/2011   LDLDIRECT 169.8 02/21/2010   LDLCALC 123* 06/23/2011        ALT 37 06/23/2011   AST 28 06/23/2011        NA 138 06/23/2011   K 4.4 06/23/2011   CL 106 06/23/2011   CREATININE 0.9 06/23/2011   BUN 15 06/23/2011   CO2 25 06/23/2011   TSH 1.52 02/21/2010   PSA 0.57 02/21/2010   HGBA1C 7.4* 06/23/2011           Assessment & Plan:

## 2011-08-04 ENCOUNTER — Ambulatory Visit (HOSPITAL_COMMUNITY): Payer: Medicare Other | Attending: Cardiovascular Disease

## 2011-08-28 ENCOUNTER — Ambulatory Visit (HOSPITAL_COMMUNITY): Payer: Medicare Other | Attending: Cardiovascular Disease | Admitting: Radiology

## 2011-08-28 DIAGNOSIS — Z8679 Personal history of other diseases of the circulatory system: Secondary | ICD-10-CM

## 2011-08-28 DIAGNOSIS — E785 Hyperlipidemia, unspecified: Secondary | ICD-10-CM | POA: Insufficient documentation

## 2011-08-28 DIAGNOSIS — R0989 Other specified symptoms and signs involving the circulatory and respiratory systems: Secondary | ICD-10-CM | POA: Insufficient documentation

## 2011-08-28 DIAGNOSIS — Z87891 Personal history of nicotine dependence: Secondary | ICD-10-CM | POA: Insufficient documentation

## 2011-08-28 DIAGNOSIS — R0602 Shortness of breath: Secondary | ICD-10-CM

## 2011-08-28 DIAGNOSIS — I00 Rheumatic fever without heart involvement: Secondary | ICD-10-CM | POA: Insufficient documentation

## 2011-08-28 DIAGNOSIS — R0609 Other forms of dyspnea: Secondary | ICD-10-CM | POA: Insufficient documentation

## 2011-08-28 DIAGNOSIS — I059 Rheumatic mitral valve disease, unspecified: Secondary | ICD-10-CM

## 2011-08-28 DIAGNOSIS — E119 Type 2 diabetes mellitus without complications: Secondary | ICD-10-CM | POA: Insufficient documentation

## 2011-08-28 NOTE — Progress Notes (Signed)
Echocardiogram performed.  

## 2011-08-30 ENCOUNTER — Encounter: Payer: Self-pay | Admitting: Internal Medicine

## 2011-10-28 ENCOUNTER — Other Ambulatory Visit (INDEPENDENT_AMBULATORY_CARE_PROVIDER_SITE_OTHER): Payer: Medicare Other

## 2011-10-28 DIAGNOSIS — E785 Hyperlipidemia, unspecified: Secondary | ICD-10-CM

## 2011-10-28 LAB — LIPID PANEL
HDL: 54.6 mg/dL (ref >39.00)
Triglycerides: 42 mg/dL (ref 0.0–149.0)
VLDL: 8.4 mg/dL (ref 0.0–40.0)

## 2011-10-28 LAB — HEPATIC FUNCTION PANEL
ALT: 26 U/L (ref 0–53)
Albumin: 4.1 g/dL (ref 3.5–5.2)
Total Bilirubin: 0.7 mg/dL (ref 0.3–1.2)
Total Protein: 7 g/dL (ref 6.0–8.3)

## 2011-10-28 LAB — HEMOGLOBIN A1C: Hgb A1c MFr Bld: 7 % — ABNORMAL HIGH (ref 4.6–6.5)

## 2011-10-30 ENCOUNTER — Encounter: Payer: Self-pay | Admitting: Internal Medicine

## 2011-10-30 ENCOUNTER — Other Ambulatory Visit: Payer: Self-pay | Admitting: Internal Medicine

## 2011-10-30 DIAGNOSIS — E785 Hyperlipidemia, unspecified: Secondary | ICD-10-CM

## 2011-10-30 MED ORDER — ATORVASTATIN CALCIUM 20 MG PO TABS: 40.0000 mg | ORAL_TABLET | Freq: Every day | ORAL | Status: DC

## 2012-01-12 ENCOUNTER — Other Ambulatory Visit (INDEPENDENT_AMBULATORY_CARE_PROVIDER_SITE_OTHER): Payer: Medicare Other

## 2012-01-12 DIAGNOSIS — E785 Hyperlipidemia, unspecified: Secondary | ICD-10-CM

## 2012-01-12 LAB — LIPID PANEL
Cholesterol: 156 mg/dL (ref 0–200)
HDL: 50.1 mg/dL (ref >39.00)
VLDL: 15.4 mg/dL (ref 0.0–40.0)

## 2012-01-18 ENCOUNTER — Encounter: Payer: Self-pay | Admitting: Internal Medicine

## 2012-01-23 ENCOUNTER — Other Ambulatory Visit: Payer: Self-pay | Admitting: Internal Medicine

## 2012-01-25 ENCOUNTER — Other Ambulatory Visit: Payer: Self-pay | Admitting: *Deleted

## 2012-01-25 MED ORDER — METFORMIN HCL 500 MG PO TABS: 500.0000 mg | ORAL_TABLET | Freq: Two times a day (BID) | ORAL | Status: DC

## 2012-03-05 ENCOUNTER — Other Ambulatory Visit: Payer: Self-pay | Admitting: Internal Medicine

## 2012-03-07 ENCOUNTER — Other Ambulatory Visit: Payer: Self-pay | Admitting: *Deleted

## 2012-03-07 DIAGNOSIS — E785 Hyperlipidemia, unspecified: Secondary | ICD-10-CM

## 2012-03-07 MED ORDER — ATORVASTATIN CALCIUM 20 MG PO TABS: 40.0000 mg | ORAL_TABLET | Freq: Every day | ORAL | Status: DC

## 2012-03-14 ENCOUNTER — Telehealth: Payer: Self-pay | Admitting: *Deleted

## 2012-03-14 NOTE — Telephone Encounter (Signed)
PATIENT STATES HIS INSURANCE COMPANY SAYS THEY WILL ONLY PAY FOR ATORVASTATIN 20MG  ONCE A DAY NOT TWO A DAY. PATIENT STATES HE HAS NEVER INCREASED HIS MEDICATION AS SUGGESTED IN LAB RESULT LETTER IN September , 2013. PLEASE CHANGE DOSE AGE INSTRUCTIONS TO 20MG  ONE TIME A DAY. PATIENT STATES THAT IS ALL HE IS EVER GOING TO TAKE REGARDLESS. CB # [phone]. LAST REFILL Rx WAS SENT IN FOR TWO A DAY OF ATORVASTATIN.

## 2012-03-14 NOTE — Telephone Encounter (Signed)
Reviewed chart: Sept '13 - LDL 132, way above goal of 100 or less. Recommended increase in dose of lipitor to 29m ( it should not have been sent in as 20 mg 2 tablets daily).  Last LDL Dec '13 was 956- if this was on 20 mg of lipitor daily then will renew Rx for the 20 mg strength. If it was on 40 mg will send in new Rx for 40 mg tablet.

## 2012-03-15 MED ORDER — ATORVASTATIN CALCIUM 20 MG PO TABS: 20.0000 mg | ORAL_TABLET | Freq: Every day | ORAL | Status: DC

## 2012-03-15 NOTE — Telephone Encounter (Signed)
Patient notified of Lipitor dose to pharmacy  One 20 mg tab daily. Patient states again he never increase this .

## 2012-08-29 ENCOUNTER — Telehealth: Payer: Self-pay

## 2012-08-29 ENCOUNTER — Other Ambulatory Visit (INDEPENDENT_AMBULATORY_CARE_PROVIDER_SITE_OTHER): Payer: Medicare Other

## 2012-08-29 NOTE — Telephone Encounter (Signed)
Patient requesting order for a1c. This was done.

## 2012-08-31 ENCOUNTER — Telehealth: Payer: Self-pay

## 2012-08-31 NOTE — Telephone Encounter (Signed)
Patient notified. He did not have any questions or concerns.

## 2012-08-31 NOTE — Telephone Encounter (Signed)
Message copied by Noreene Larsson on Wed Aug 31, 2012  8:24 AM ------      Message from: Jacques Navy      Created: Tue Aug 30, 2012 10:39 PM       Please call patient: A1C 7.4% slightly above goal of 7%. No change in medication, stricter diet. ------

## 2012-10-25 ENCOUNTER — Ambulatory Visit (INDEPENDENT_AMBULATORY_CARE_PROVIDER_SITE_OTHER): Payer: Medicare Other | Admitting: Internal Medicine

## 2012-10-25 ENCOUNTER — Encounter: Payer: Self-pay | Admitting: Internal Medicine

## 2012-10-25 VITALS — BP 130/80 | HR 58 | Temp 97.0°F | Ht 72.0 in | Wt 188.0 lb

## 2012-10-25 DIAGNOSIS — Z23 Encounter for immunization: Secondary | ICD-10-CM

## 2012-10-25 DIAGNOSIS — Z125 Encounter for screening for malignant neoplasm of prostate: Secondary | ICD-10-CM

## 2012-10-25 DIAGNOSIS — E785 Hyperlipidemia, unspecified: Secondary | ICD-10-CM

## 2012-10-25 DIAGNOSIS — R202 Paresthesia of skin: Secondary | ICD-10-CM

## 2012-10-25 DIAGNOSIS — R209 Unspecified disturbances of skin sensation: Secondary | ICD-10-CM

## 2012-10-25 DIAGNOSIS — K219 Gastro-esophageal reflux disease without esophagitis: Secondary | ICD-10-CM

## 2012-10-25 DIAGNOSIS — Z0000 Encounter for general adult medical examination without abnormal findings: Secondary | ICD-10-CM

## 2012-10-25 NOTE — Progress Notes (Signed)
Subjective:    Patient ID: Angel Rodgers, male    DOB: May 17, 1945, 67 y.o.   MRN: [id number]  HPI The patient is here for annual Medicare wellness examination and management of other chronic and acute problems.  Interval history no major medical illness, no surgery and no injury.    The risk factors are reflected in the social history.  The roster of all physicians providing medical care to patient - is listed in the Snapshot section of the chart.  Activities of daily living:  The patient is 100% inedpendent in all ADLs: dressing, toileting, feeding as well as independent mobility  Home safety : The patient has smoke detectors in the home. Falls - none.    They wear seatbelts.  firearms are present in the home, kept in a safe fashion. There is no violence in the home.   There is no risks for hepatitis, STDs or HIV. There is no history of blood transfusion. They have no travel history to infectious disease endemic areas of the world.  The patient has not seen their dentist in the last six month- has upper dentures. They have not seen their eye doctor in the last year. They deny any hearing difficulty and have not had audiologic testing in the last year.    They do not  have excessive sun exposure. Discussed the need for sun protection: hats, long sleeves and use of sunscreen if there is significant sun exposure.   Diet: the importance of a healthy diet is discussed. They do have a healthy diet.  The patient has no regular exercise program.  The benefits of regular aerobic exercise were discussed.  Depression screen: there are no signs or vegative symptoms of depression- irritability, change in appetite, anhedonia, sadness/tearfullness.  Cognitive assessment: the patient manages all their financial and personal affairs and is actively engaged.   The following portions of the patient's history were reviewed and updated as appropriate: allergies, current medications, past family history,  past medical history,  past surgical history, past social history  and problem list.  Past Medical History  Diagnosis Date  . Benign neoplasm of colon   . Endocarditis, valve unspecified, unspecified cause   . History of rheumatic fever   . GERD (gastroesophageal reflux disease)   . Syncope and collapse    History reviewed. No pertinent past surgical history. Family History  Problem Relation Age of Onset  . Colon cancer Maternal Uncle   . Coronary artery disease Mother     AICD/PACER  . Hypertension Mother   . Diabetes Mother   . Hyperlipidemia Mother   . Coronary artery disease Father     AICD Pacer  . Cancer Father     ANAL  . Prostate cancer Neg Hx   . Stomach cancer Other   . Diabetes Other    History   Social History  . Marital Status: Married    Spouse Name: N/A    Number of Children: 2  . Years of Education: 12   Occupational History  . driver    Social History Main Topics  . Smoking status: Former Research scientist (life sciences)  . Smokeless tobacco: Never Used  . Alcohol Use: Yes  . Drug Use: No  . Sexual Activity: Not on file   Other Topics Concern  . Not on file   Social History Narrative   HSG, community college 2 years. [employer]-3 years, E5 at discharge. Married  '65. 1 son '70, 1 dtr '69; 2 grandchildren. Work- drove Theatre manager  now retired    Current Outpatient Prescriptions on File Prior to Visit  Medication Sig Dispense Refill  . atorvastatin (LIPITOR) 20 MG tablet Take 1 tablet (20 mg total) by mouth daily.  30 tablet  6  . metFORMIN (GLUCOPHAGE) 500 MG tablet Take 1 tablet (500 mg total) by mouth 2 (two) times daily with a meal.  60 tablet  5   No current facility-administered medications on file prior to visit.     Vision, hearing, body mass index were assessed and reviewed.   During the course of the visit the patient was educated and counseled about appropriate screening and preventive services including : fall prevention , diabetes screening, nutrition  counseling, colorectal cancer screening, and recommended immunizations.    Review of Systems Constitutional:  Negative for fever, chills, activity change and unexpected weight change.  HEENT:  Negative for hearing loss, ear pain, congestion, neck stiffness and postnasal drip. Negative for sore throat or swallowing problems. Negative for dental complaints.   Eyes: Negative for vision loss or change in visual acuity.  Respiratory: Negative for chest tightness and wheezing. Negative for DOE.   Cardiovascular: Negative for chest pain or palpitations. No decreased exercise tolerance Gastrointestinal: No change in bowel habit. No bloating or gas. No reflux or indigestion Genitourinary: Negative for urgency, frequency, flank pain and difficulty urinating.  Musculoskeletal: Negative for myalgias, back pain, arthralgias and gait problem.  Neurological: Negative for dizziness, tremors, weakness and headaches.  Hematological: Negative for adenopathy.  Psychiatric/Behavioral: Negative for behavioral problems and dysphoric mood.       Objective:   Physical Exam Filed Vitals:   10/25/12 0847  BP: 130/80  Pulse: 58  Temp: 97 F (36.1 C)   Wt Readings from Last 3 Encounters:  10/25/12 188 lb (85.276 kg)  07/28/11 190 lb (86.183 kg)  12/26/10 191 lb (86.637 kg)   Gen'l: Well nourished well developed male in no acute distress  HEENT: Head: Normocephalic and atraumatic. Right Ear: External ear normal. EAC/TM nl. Left Ear: External ear normal.  EAC/TM nl. Nose: Nose normal. Mouth/Throat: Oropharynx is clear and moist. Dentition - full upper denture, native mandible, in good repair. No buccal or lesions. Posterior pharynx clear. Eyes: Conjunctivae and sclera clear. EOM intact. Pupils are equal, round, and reactive to light. Right eye exhibits no discharge. Left eye exhibits no discharge. Neck: Normal range of motion. Neck supple. No JVD present. No tracheal deviation present. No thyromegaly present.   Cardiovascular: Normal rate, regular rhythm, no gallop, no friction rub, no murmur heard.      Quiet precordium. 2+ radial and DP pulses . No carotid bruits Pulmonary/Chest: Effort normal. No respiratory distress or increased WOB, no wheezes, no rales. No chest wall deformity or CVAT. Abdomen: Soft. Bowel sounds are normal in all quadrants. He exhibits no distension, no tenderness, no rebound or guarding, No heptosplenomegaly  Genitourinary:  deferred to PSA Musculoskeletal: Normal range of motion. He exhibits no edema and no tenderness.       Small and large joints without redness, synovial thickening or deformity. Full range of motion preserved about all small, median and large joints.  Lymphadenopathy:    He has no cervical or supraclavicular adenopathy.  Neurological: He is alert and oriented to person, place, and time. CN II-XII intact. DTRs 2+ and symmetrical biceps, radial and patellar tendons. Cerebellar function normal with no tremor, rigidity, normal gait and station. see foot exam Skin: Skin is warm and dry. No rash noted. No erythema.  Psychiatric:  He has a normal mood and affect. His behavior is normal. Thought content normal.   Recent Results (from the past 2160 hour(s))  HEMOGLOBIN A1C     Status: Abnormal   Collection Time    08/29/12  9:12 AM      Result Value Range   Hemoglobin A1C 7.4 (*) 4.6 - 6.5 %   Comment: Glycemic Control Guidelines for People with Diabetes:Non Diabetic:  <6%Goal of Therapy: <7%Additional Action Suggested:  >8%          Assessment & Plan:  Paresthesia - distal LE: decreased deep vibe sensation. Diabetic vs metabolic etiology Plan Will add B12 to current labs

## 2012-10-25 NOTE — Patient Instructions (Addendum)
Good to see you. Your exam is normal except for decreased vibratory sensation in the feet.  Lab is ordered as above  Think about taking up a aerobic exercise program, e.g. Walking, for 30 minutes 3 times a week to get a heart rate of 120+.  Please return for an A1C April '15.

## 2012-10-26 NOTE — Assessment & Plan Note (Signed)
No active symptoms reported at this time.  Plan Continue present treatment

## 2012-10-26 NOTE — Assessment & Plan Note (Addendum)
Lab Results  Component Value Date   HGBA1C 7.4* 08/29/2012   No change in medication at this time.   Plan Better life style adherence: diet and exercise  He is advised to have annual eye exam with report to [place].  Follow up lab in Oct '14

## 2012-10-26 NOTE — Assessment & Plan Note (Signed)
Interval history is benign. Physical exam is normal. He is current with colonoscopy. Discussed pros and cons of prostate cancer screening (USPHCTF recommendations reviewed and ACU April '13 recommendations) and he requests evaluation at this time. Immunizations are current except for shingles vaccine - he will check coverage.  In summary - a nice man who is medically stable. Follow up labs in October '14.  Return OV/CPE 1 year.

## 2012-10-26 NOTE — Assessment & Plan Note (Signed)
Taking and tolerating medication. Last LDL 169 - way above goal, but this was prior to initiating medical therapy.  Plan Follow up lipid panel October '14 with recommendations to follow to attain goal of 100 or less

## 2012-12-06 ENCOUNTER — Other Ambulatory Visit (INDEPENDENT_AMBULATORY_CARE_PROVIDER_SITE_OTHER): Payer: Medicare Other

## 2012-12-06 DIAGNOSIS — R202 Paresthesia of skin: Secondary | ICD-10-CM

## 2012-12-06 DIAGNOSIS — Z125 Encounter for screening for malignant neoplasm of prostate: Secondary | ICD-10-CM

## 2012-12-06 DIAGNOSIS — IMO0001 Reserved for inherently not codable concepts without codable children: Secondary | ICD-10-CM

## 2012-12-06 DIAGNOSIS — E785 Hyperlipidemia, unspecified: Secondary | ICD-10-CM

## 2012-12-06 DIAGNOSIS — R209 Unspecified disturbances of skin sensation: Secondary | ICD-10-CM

## 2012-12-06 LAB — HEPATIC FUNCTION PANEL
AST: 18 U/L (ref 0–37)
Albumin: 3.9 g/dL (ref 3.5–5.2)
Alkaline Phosphatase: 79 U/L (ref 39–117)
Total Bilirubin: 0.9 mg/dL (ref 0.3–1.2)

## 2012-12-06 LAB — COMPREHENSIVE METABOLIC PANEL
ALT: 23 U/L (ref 0–53)
BUN: 18 mg/dL (ref 6–23)
CO2: 27 mEq/L (ref 19–32)
Calcium: 9.1 mg/dL (ref 8.4–10.5)
Chloride: 107 mEq/L (ref 96–112)
Creatinine, Ser: 0.9 mg/dL (ref 0.4–1.5)
GFR: 91.11 mL/min (ref >60.00)
Glucose, Bld: 127 mg/dL — ABNORMAL HIGH (ref 70–99)
Total Bilirubin: 0.9 mg/dL (ref 0.3–1.2)

## 2012-12-06 LAB — LIPID PANEL
HDL: 42.1 mg/dL (ref >39.00)
LDL Cholesterol: 65 mg/dL (ref 0–99)
VLDL: 5.8 mg/dL (ref 0.0–40.0)

## 2012-12-06 LAB — TSH: TSH: 1.33 u[IU]/mL (ref 0.35–5.50)

## 2012-12-06 LAB — PSA: PSA: 0.67 ng/mL (ref 0.10–4.00)

## 2012-12-08 ENCOUNTER — Encounter: Payer: Self-pay | Admitting: Internal Medicine

## 2013-01-12 ENCOUNTER — Other Ambulatory Visit: Payer: Self-pay | Admitting: Internal Medicine

## 2013-03-16 ENCOUNTER — Other Ambulatory Visit: Payer: Self-pay

## 2013-03-16 MED ORDER — GLUCOSE BLOOD VI STRP: ORAL_STRIP | Status: DC

## 2013-03-16 MED ORDER — ACCU-CHEK SMARTVIEW CONTROL VI LIQD: Status: DC

## 2013-03-16 MED ORDER — ACCU-CHEK NANO SMARTVIEW W/DEVICE KIT: PACK | Status: DC

## 2013-03-16 MED ORDER — ACCU-CHEK FASTCLIX LANCETS MISC: Status: DC

## 2013-03-16 MED ORDER — BD SWAB SINGLE USE REGULAR PADS: MEDICATED_PAD | Status: DC

## 2013-03-16 MED ORDER — METFORMIN HCL 500 MG PO TABS: 500.0000 mg | ORAL_TABLET | Freq: Two times a day (BID) | ORAL | Status: DC

## 2013-03-16 NOTE — Telephone Encounter (Signed)
Dr Linda Hedges how often should patient test blood sugars? So I can note that on the prescriptions

## 2013-04-13 ENCOUNTER — Ambulatory Visit: Payer: Medicare HMO | Admitting: Internal Medicine

## 2013-04-17 ENCOUNTER — Other Ambulatory Visit (INDEPENDENT_AMBULATORY_CARE_PROVIDER_SITE_OTHER): Payer: Medicare HMO

## 2013-04-17 ENCOUNTER — Encounter: Payer: Self-pay | Admitting: Internal Medicine

## 2013-04-17 ENCOUNTER — Ambulatory Visit (INDEPENDENT_AMBULATORY_CARE_PROVIDER_SITE_OTHER): Payer: Medicare HMO | Admitting: Internal Medicine

## 2013-04-17 VITALS — BP 138/80 | HR 70 | Temp 97.8°F | Wt 190.0 lb

## 2013-04-17 DIAGNOSIS — IMO0001 Reserved for inherently not codable concepts without codable children: Secondary | ICD-10-CM

## 2013-04-17 DIAGNOSIS — E1165 Type 2 diabetes mellitus with hyperglycemia: Principal | ICD-10-CM

## 2013-04-17 LAB — HEMOGLOBIN A1C: HEMOGLOBIN A1C: 7.6 % — AB (ref 4.6–6.5)

## 2013-04-17 NOTE — Progress Notes (Signed)
Pre visit review using our clinic review tool, if applicable. No additional management support is needed unless otherwise documented below in the visit note. 

## 2013-04-17 NOTE — Progress Notes (Signed)
   Subjective:    Patient ID: Angel Rodgers, male    DOB: [birth year], 68 y.o.   MRN: [id number]  HPI Patient sent up from lab in error - all he needed today was an A1C. Order entered. No charge for today.   Review of Systems     Objective:   Physical Exam        Assessment & Plan:

## 2013-04-21 ENCOUNTER — Other Ambulatory Visit: Payer: Self-pay | Admitting: Internal Medicine

## 2013-04-21 ENCOUNTER — Encounter: Payer: Self-pay | Admitting: Internal Medicine

## 2013-04-21 DIAGNOSIS — IMO0001 Reserved for inherently not codable concepts without codable children: Secondary | ICD-10-CM

## 2013-04-21 DIAGNOSIS — E1165 Type 2 diabetes mellitus with hyperglycemia: Principal | ICD-10-CM

## 2013-04-21 MED ORDER — METFORMIN HCL 1000 MG PO TABS: 1000.0000 mg | ORAL_TABLET | Freq: Two times a day (BID) | ORAL | Status: DC

## 2013-07-03 ENCOUNTER — Other Ambulatory Visit (INDEPENDENT_AMBULATORY_CARE_PROVIDER_SITE_OTHER): Payer: Medicare HMO

## 2013-07-03 DIAGNOSIS — E1165 Type 2 diabetes mellitus with hyperglycemia: Principal | ICD-10-CM

## 2013-07-03 DIAGNOSIS — IMO0001 Reserved for inherently not codable concepts without codable children: Secondary | ICD-10-CM

## 2013-07-03 LAB — HEMOGLOBIN A1C: HEMOGLOBIN A1C: 7.1 % — AB (ref 4.6–6.5)

## 2013-08-16 ENCOUNTER — Encounter: Payer: Medicare HMO | Admitting: Internal Medicine

## 2013-08-21 ENCOUNTER — Ambulatory Visit (INDEPENDENT_AMBULATORY_CARE_PROVIDER_SITE_OTHER): Payer: Medicare HMO | Admitting: Internal Medicine

## 2013-08-21 ENCOUNTER — Other Ambulatory Visit (INDEPENDENT_AMBULATORY_CARE_PROVIDER_SITE_OTHER): Payer: Medicare HMO

## 2013-08-21 ENCOUNTER — Encounter: Payer: Self-pay | Admitting: Internal Medicine

## 2013-08-21 VITALS — BP 112/70 | HR 59 | Temp 98.7°F | Resp 16 | Ht 72.0 in | Wt 183.0 lb

## 2013-08-21 DIAGNOSIS — E1165 Type 2 diabetes mellitus with hyperglycemia: Principal | ICD-10-CM

## 2013-08-21 DIAGNOSIS — E785 Hyperlipidemia, unspecified: Secondary | ICD-10-CM

## 2013-08-21 DIAGNOSIS — IMO0001 Reserved for inherently not codable concepts without codable children: Secondary | ICD-10-CM

## 2013-08-21 DIAGNOSIS — Z23 Encounter for immunization: Secondary | ICD-10-CM

## 2013-08-21 DIAGNOSIS — Z8679 Personal history of other diseases of the circulatory system: Secondary | ICD-10-CM

## 2013-08-21 DIAGNOSIS — D126 Benign neoplasm of colon, unspecified: Secondary | ICD-10-CM

## 2013-08-21 DIAGNOSIS — K219 Gastro-esophageal reflux disease without esophagitis: Secondary | ICD-10-CM

## 2013-08-21 DIAGNOSIS — Z0000 Encounter for general adult medical examination without abnormal findings: Secondary | ICD-10-CM

## 2013-08-21 LAB — CBC WITH DIFFERENTIAL/PLATELET
Basophils Absolute: 0.1 10*3/uL (ref 0.0–0.1)
Basophils Relative: 0.8 % (ref 0.0–3.0)
EOS PCT: 3.7 % (ref 0.0–5.0)
Eosinophils Absolute: 0.3 10*3/uL (ref 0.0–0.7)
HCT: 46.4 % (ref 39.0–52.0)
Hemoglobin: 15.3 g/dL (ref 13.0–17.0)
Lymphocytes Relative: 37.1 % (ref 12.0–46.0)
Lymphs Abs: 3.2 10*3/uL (ref 0.7–4.0)
MCHC: 33 g/dL (ref 30.0–36.0)
MCV: 93 fl (ref 78.0–100.0)
MONO ABS: 0.8 10*3/uL (ref 0.1–1.0)
MONOS PCT: 9.4 % (ref 3.0–12.0)
NEUTROS PCT: 49 % (ref 43.0–77.0)
Neutro Abs: 4.2 10*3/uL (ref 1.4–7.7)
PLATELETS: 174 10*3/uL (ref 150.0–400.0)
RBC: 4.99 Mil/uL (ref 4.22–5.81)
RDW: 13.4 % (ref 11.5–15.5)
WBC: 8.6 10*3/uL (ref 4.0–10.5)

## 2013-08-21 LAB — URINALYSIS, ROUTINE W REFLEX MICROSCOPIC
Bilirubin Urine: NEGATIVE
Hgb urine dipstick: NEGATIVE
Ketones, ur: NEGATIVE
Leukocytes, UA: NEGATIVE
NITRITE: NEGATIVE
PH: 5.5 (ref 5.0–8.0)
RBC / HPF: NONE SEEN (ref 0–?)
Specific Gravity, Urine: 1.02 (ref 1.000–1.030)
TOTAL PROTEIN, URINE-UPE24: NEGATIVE
Urine Glucose: NEGATIVE
Urobilinogen, UA: 0.2 (ref 0.0–1.0)
WBC UA: NONE SEEN (ref 0–?)

## 2013-08-21 LAB — COMPREHENSIVE METABOLIC PANEL
ALK PHOS: 78 U/L (ref 39–117)
ALT: 30 U/L (ref 0–53)
AST: 27 U/L (ref 0–37)
Albumin: 4.1 g/dL (ref 3.5–5.2)
BUN: 15 mg/dL (ref 6–23)
CO2: 29 meq/L (ref 19–32)
Calcium: 9.4 mg/dL (ref 8.4–10.5)
Chloride: 106 mEq/L (ref 96–112)
Creatinine, Ser: 0.9 mg/dL (ref 0.4–1.5)
GFR: 93.36 mL/min (ref >60.00)
Glucose, Bld: 130 mg/dL — ABNORMAL HIGH (ref 70–99)
POTASSIUM: 5.3 meq/L — AB (ref 3.5–5.1)
SODIUM: 140 meq/L (ref 135–145)
TOTAL PROTEIN: 7.1 g/dL (ref 6.0–8.3)
Total Bilirubin: 0.6 mg/dL (ref 0.2–1.2)

## 2013-08-21 LAB — LIPID PANEL
CHOLESTEROL: 145 mg/dL (ref 0–200)
HDL: 48.6 mg/dL (ref >39.00)
LDL Cholesterol: 86 mg/dL (ref 0–99)
NONHDL: 96.4
Total CHOL/HDL Ratio: 3
Triglycerides: 52 mg/dL (ref 0.0–149.0)
VLDL: 10.4 mg/dL (ref 0.0–40.0)

## 2013-08-21 LAB — FECAL OCCULT BLOOD, GUAIAC: Fecal Occult Blood: NEGATIVE

## 2013-08-21 LAB — TSH: TSH: 2.75 u[IU]/mL (ref 0.35–4.50)

## 2013-08-21 LAB — MICROALBUMIN / CREATININE URINE RATIO
Creatinine,U: 68.2 mg/dL
MICROALB UR: 0.3 mg/dL (ref 0.0–1.9)
Microalb Creat Ratio: 0.4 mg/g (ref 0.0–30.0)

## 2013-08-21 NOTE — Assessment & Plan Note (Signed)
His LDL is normal and he tells me that he dose not want to take a statin

## 2013-08-21 NOTE — Progress Notes (Signed)
Pre visit review using our clinic review tool, if applicable. No additional management support is needed unless otherwise documented below in the visit note. 

## 2013-08-21 NOTE — Assessment & Plan Note (Signed)
His A1C shows good control of the blood sugars He was referred for an eye exam I will monitor his renal function today

## 2013-08-21 NOTE — Assessment & Plan Note (Addendum)

## 2013-08-21 NOTE — Patient Instructions (Signed)
Health Maintenance A healthy lifestyle and preventative care can promote health and wellness.  Maintain regular health, dental, and eye exams.  Eat a healthy diet. Foods like vegetables, fruits, whole grains, low-fat dairy products, and lean protein foods contain the nutrients you need and are low in calories. Decrease your intake of foods high in solid fats, added sugars, and salt. Get information about a proper diet from your health care provider, if necessary.  Regular physical exercise is one of the most important things you can do for your health. Most adults should get at least 150 minutes of moderate-intensity exercise (any activity that increases your heart rate and causes you to sweat) each week. In addition, most adults need muscle-strengthening exercises on 2 or more days a week.   Maintain a healthy weight. The body mass index (BMI) is a screening tool to identify possible weight problems. It provides an estimate of body fat based on height and weight. Your health care provider can find your BMI and can help you achieve or maintain a healthy weight. For males 20 years and older:  A BMI below 18.5 is considered underweight.  A BMI of 18.5 to 24.9 is normal.  A BMI of 25 to 29.9 is considered overweight.  A BMI of 30 and above is considered obese.  Maintain normal blood lipids and cholesterol by exercising and minimizing your intake of saturated fat. Eat a balanced diet with plenty of fruits and vegetables. Blood tests for lipids and cholesterol should begin at age 20 and be repeated every 5 years. If your lipid or cholesterol levels are high, you are over age 50, or you are at high risk for heart disease, you may need your cholesterol levels checked more frequently.Ongoing high lipid and cholesterol levels should be treated with medicines if diet and exercise are not working.  If you smoke, find out from your health care provider how to quit. If you do not use tobacco, do not  start.  Lung cancer screening is recommended for adults aged 55-80 years who are at high risk for developing lung cancer because of a history of smoking. A yearly low-dose CT scan of the lungs is recommended for people who have at least a 30-pack-year history of smoking and are current smokers or have quit within the past 15 years. A pack year of smoking is smoking an average of 1 pack of cigarettes a day for 1 year (for example, a 30-pack-year history of smoking could mean smoking 1 pack a day for 30 years or 2 packs a day for 15 years). Yearly screening should continue until the smoker has stopped smoking for at least 15 years. Yearly screening should be stopped for people who develop a health problem that would prevent them from having lung cancer treatment.  If you choose to drink alcohol, do not have more than 2 drinks per day. One drink is considered to be 12 oz (360 mL) of beer, 5 oz (150 mL) of wine, or 1.5 oz (45 mL) of liquor.  Avoid the use of street drugs. Do not share needles with anyone. Ask for help if you need support or instructions about stopping the use of drugs.  High blood pressure causes heart disease and increases the risk of stroke. Blood pressure should be checked at least every 1-2 years. Ongoing high blood pressure should be treated with medicines if weight loss and exercise are not effective.  If you are 45-79 years old, ask your health care provider if   you should take aspirin to prevent heart disease.  Diabetes screening involves taking a blood sample to check your fasting blood sugar level. This should be done once every 3 years after age 45 if you are at a normal weight and without risk factors for diabetes. Testing should be considered at a younger age or be carried out more frequently if you are overweight and have at least 1 risk factor for diabetes.  Colorectal cancer can be detected and often prevented. Most routine colorectal cancer screening begins at the age of 50  and continues through age 75. However, your health care provider may recommend screening at an earlier age if you have risk factors for colon cancer. On a yearly basis, your health care provider may provide home test kits to check for hidden blood in the stool. A small camera at the end of a tube may be used to directly examine the colon (sigmoidoscopy or colonoscopy) to detect the earliest forms of colorectal cancer. Talk to your health care provider about this at age 50 when routine screening begins. A direct exam of the colon should be repeated every 5-10 years through age 75, unless early forms of precancerous polyps or small growths are found.  People who are at an increased risk for hepatitis B should be screened for this virus. You are considered at high risk for hepatitis B if:  You were born in a country where hepatitis B occurs often. Talk with your health care provider about which countries are considered high risk.  Your parents were born in a high-risk country and you have not received a shot to protect against hepatitis B (hepatitis B vaccine).  You have HIV or AIDS.  You use needles to inject street drugs.  You live with, or have sex with, someone who has hepatitis B.  You are a man who has sex with other men (MSM).  You get hemodialysis treatment.  You take certain medicines for conditions like cancer, organ transplantation, and autoimmune conditions.  Hepatitis C blood testing is recommended for all people born from 1945 through 1965 and any individual with known risk factors for hepatitis C.  Healthy men should no longer receive prostate-specific antigen (PSA) blood tests as part of routine cancer screening. Talk to your health care provider about prostate cancer screening.  Testicular cancer screening is not recommended for adolescents or adult males who have no symptoms. Screening includes self-exam, a health care provider exam, and other screening tests. Consult with your  health care provider about any symptoms you have or any concerns you have about testicular cancer.  Practice safe sex. Use condoms and avoid high-risk sexual practices to reduce the spread of sexually transmitted infections (STIs).  You should be screened for STIs, including gonorrhea and chlamydia if:  You are sexually active and are younger than 24 years.  You are older than 24 years, and your health care provider tells you that you are at risk for this type of infection.  Your sexual activity has changed since you were last screened, and you are at an increased risk for chlamydia or gonorrhea. Ask your health care provider if you are at risk.  If you are at risk of being infected with HIV, it is recommended that you take a prescription medicine daily to prevent HIV infection. This is called pre-exposure prophylaxis (PrEP). You are considered at risk if:  You are a man who has sex with other men (MSM).  You are a heterosexual man who   is sexually active with multiple partners.  You take drugs by injection.  You are sexually active with a partner who has HIV.  Talk with your health care provider about whether you are at high risk of being infected with HIV. If you choose to begin PrEP, you should first be tested for HIV. You should then be tested every 3 months for as long as you are taking PrEP.  Use sunscreen. Apply sunscreen liberally and repeatedly throughout the day. You should seek shade when your shadow is shorter than you. Protect yourself by wearing long sleeves, pants, a wide-brimmed hat, and sunglasses year round whenever you are outdoors.  Tell your health care provider of new moles or changes in moles, especially if there is a change in shape or color. Also, tell your health care provider if a mole is larger than the size of a pencil eraser.  A one-time screening for abdominal aortic aneurysm (AAA) and surgical repair of large AAAs by ultrasound is recommended for men aged  66-75 years who are current or former smokers.  Stay current with your vaccines (immunizations). Document Released: 08/01/2007 Document Revised: 02/07/2013 Document Reviewed: 06/30/2010 [facility] Patient Information 2015 [place], [place]. This information is not intended to replace advice given to you by your health care provider. Make sure you discuss any questions you have with your health care provider. Type 2 Diabetes Mellitus, Adult Type 2 diabetes mellitus, often simply referred to as type 2 diabetes, is a long-lasting (chronic) disease. In type 2 diabetes, the pancreas does not make enough insulin (a hormone), the cells are less responsive to the insulin that is made (insulin resistance), or both. Normally, insulin moves sugars from food into the tissue cells. The tissue cells use the sugars for energy. The lack of insulin or the lack of normal response to insulin causes excess sugars to build up in the blood instead of going into the tissue cells. As a result, high blood sugar (hyperglycemia) develops. The effect of high sugar (glucose) levels can cause many complications. Type 2 diabetes was also previously called adult-onset diabetes but it can occur at any age.  RISK FACTORS  A person is predisposed to developing type 2 diabetes if someone in the family has the disease and also has one or more of the following primary risk factors:  Overweight.  An inactive lifestyle.  A history of consistently eating high-calorie foods. Maintaining a normal weight and regular physical activity can reduce the chance of developing type 2 diabetes. SYMPTOMS  A person with type 2 diabetes may not show symptoms initially. The symptoms of type 2 diabetes appear slowly. The symptoms include:  Increased thirst (polydipsia).  Increased urination (polyuria).  Increased urination during the night (nocturia).  Weight loss. This weight loss may be rapid.  Frequent, recurring infections.  Tiredness  (fatigue).  Weakness.  Vision changes, such as blurred vision.  Fruity smell to your breath.  Abdominal pain.  Nausea or vomiting.  Cuts or bruises which are slow to heal.  Tingling or numbness in the hands or feet. DIAGNOSIS Type 2 diabetes is frequently not diagnosed until complications of diabetes are present. Type 2 diabetes is diagnosed when symptoms or complications are present and when blood glucose levels are increased. Your blood glucose level may be checked by one or more of the following blood tests:  A fasting blood glucose test. You will not be allowed to eat for at least 8 hours before a blood sample is taken.  A random  blood glucose test. Your blood glucose is checked at any time of the day regardless of when you ate.  A hemoglobin A1c blood glucose test. A hemoglobin A1c test provides information about blood glucose control over the previous 3 months.  An oral glucose tolerance test (OGTT). Your blood glucose is measured after you have not eaten (fasted) for 2 hours and then after you drink a glucose-containing beverage. TREATMENT   You may need to take insulin or diabetes medicine daily to keep blood glucose levels in the desired range.  If you use insulin, you may need to adjust the dosage depending on the carbohydrates that you eat with each meal or snack. The treatment goal is to maintain the before meal blood sugar (preprandial glucose) level at 70-130 mg/dL. HOME CARE INSTRUCTIONS   Have your hemoglobin A1c level checked twice a year.  Perform daily blood glucose monitoring as directed by your health care provider.  Monitor urine ketones when you are ill and as directed by your health care provider.  Take your diabetes medicine or insulin as directed by your health care provider to maintain your blood glucose levels in the desired range.  Never run out of diabetes medicine or insulin. It is needed every day.  If you are using insulin, you may need to  adjust the amount of insulin given based on your intake of carbohydrates. Carbohydrates can raise blood glucose levels but need to be included in your diet. Carbohydrates provide vitamins, minerals, and fiber which are an essential part of a healthy diet. Carbohydrates are found in fruits, vegetables, whole grains, dairy products, legumes, and foods containing added sugars.  Eat healthy foods. You should make an appointment to see a registered dietitian to help you create an eating plan that is right for you.  Lose weight if overweight.  Carry a medical alert card or wear your medical alert jewelry.  Carry a 15 gram carbohydrate snack with you at all times to treat low blood glucose (hypoglycemia). Some examples of 15 gram carbohydrate snacks include:  Glucose tablets, 3 or 4  Raisins, 2 tablespoons (24 grams)  Jelly beans, 6  Animal crackers, 8  Regular pop, 4 ounces (120 mL)  Gummy treats, 9  Recognize hypoglycemia. Hypoglycemia occurs with blood glucose levels of 70 mg/dL and below. The risk for hypoglycemia increases when fasting or skipping meals, during or after intense exercise, and during sleep. Hypoglycemia symptoms can include:  Tremors or shakes.  Decreased ability to concentrate.  Sweating.  Increased heart rate.  Headache.  Dry mouth.  Hunger.  Irritability.  Anxiety.  Restless sleep.  Altered speech or coordination.  Confusion.  Treat hypoglycemia promptly. If you are alert and able to safely swallow, follow the 15:15 rule:  Take 15-20 grams of rapid-acting glucose or carbohydrate. Rapid-acting options include glucose gel, glucose tablets, or 4 ounces (120 mL) of fruit juice, regular soda, or low fat milk.  Check your blood glucose level 15 minutes after taking the glucose.  Take 15-20 grams more of glucose if the repeat blood glucose level is still 70 mg/dL or below.  Eat a meal or snack within 1 hour once blood glucose levels return to  normal.  Be alert to feeling very thirsty and urinating more frequently than usual, which are early signs of hyperglycemia. An early awareness of hyperglycemia allows for prompt treatment. Treat hyperglycemia as directed by your health care provider.  Engage in at least 150 minutes of moderate-intensity physical activity a week, spread  over at least 3 days of the week or as directed by your health care provider. In addition, you should engage in resistance exercise at least 2 times a week or as directed by your health care provider.  Adjust your medicine and food intake as needed if you start a new exercise or sport.  Follow your sick day plan at any time you are unable to eat or drink as usual.  Avoid tobacco use.  Limit alcohol intake to no more than 1 drink per day for nonpregnant women and 2 drinks per day for men. You should drink alcohol only when you are also eating food. Talk with your health care provider whether alcohol is safe for you. Tell your health care provider if you drink alcohol several times a week.  Follow up with your health care provider regularly.  Schedule an eye exam soon after the diagnosis of type 2 diabetes and then annually.  Perform daily skin and foot care. Examine your skin and feet daily for cuts, bruises, redness, nail problems, bleeding, blisters, or sores. A foot exam by a health care provider should be done annually.  Brush your teeth and gums at least twice a day and floss at least once a day. Follow up with your dentist regularly.  Share your diabetes management plan with your workplace or school.  Stay up-to-date with immunizations.  Learn to manage stress.  Obtain ongoing diabetes education and support as needed.  Participate in, or seek rehabilitation as needed to maintain or improve independence and quality of life. Request a physical or occupational therapy referral if you are having foot or hand numbness or difficulties with grooming,  dressing, eating, or physical activity. SEEK MEDICAL CARE IF:   You are unable to eat food or drink fluids for more than 6 hours.  You have nausea and vomiting for more than 6 hours.  Your blood glucose level is over 240 mg/dL.  There is a change in mental status.  You develop an additional serious illness.  You have diarrhea for more than 6 hours.  You have been sick or have had a fever for a couple of days and are not getting better.  You have pain during any physical activity.  SEEK IMMEDIATE MEDICAL CARE IF:  You have difficulty breathing.  You have moderate to large ketone levels. MAKE SURE YOU:  Understand these instructions.  Will watch your condition.  Will get help right away if you are not doing well or get worse. Document Released: 02/02/2005 Document Revised: 02/07/2013 Document Reviewed: 09/01/2011 [facility] Patient Information 2015 [place], [place]. This information is not intended to replace advice given to you by your health care provider. Make sure you discuss any questions you have with your health care provider.

## 2013-08-21 NOTE — Progress Notes (Signed)
Subjective:    Patient ID: Angel Rodgers, male    DOB: [birth year], 68 y.o.   MRN: [id number]  Diabetes He presents for his follow-up diabetic visit. He has type 2 diabetes mellitus. His disease course has been stable. There are no hypoglycemic associated symptoms. Pertinent negatives for hypoglycemia include no dizziness, headaches or tremors. Pertinent negatives for diabetes include no blurred vision, no chest pain, no fatigue, no foot paresthesias, no foot ulcerations, no polydipsia, no polyphagia, no polyuria, no visual change, no weakness and no weight loss. There are no hypoglycemic complications. There are no diabetic complications. Current diabetic treatment includes oral agent (monotherapy). He is compliant with treatment all of the time. His weight is stable. He is following a generally healthy diet. Meal planning includes avoidance of concentrated sweets. He has not had a previous visit with a dietician. He participates in exercise three times a week. There is no change in his home blood glucose trend. An ACE inhibitor/angiotensin II receptor blocker is not being taken. He does not see a podiatrist.Eye exam is not current.      Review of Systems  Constitutional: Negative.  Negative for fever, chills, weight loss, diaphoresis, appetite change and fatigue.  HENT: Negative.   Eyes: Negative.  Negative for blurred vision.  Respiratory: Negative.  Negative for apnea, cough, choking, chest tightness, shortness of breath, wheezing and stridor.   Cardiovascular: Negative.  Negative for chest pain, palpitations and leg swelling.  Gastrointestinal: Negative.  Negative for nausea, vomiting, abdominal pain, diarrhea, constipation and blood in stool.  Endocrine: Negative.  Negative for polydipsia, polyphagia and polyuria.  Genitourinary: Negative.   Skin: Negative.   Allergic/Immunologic: Negative.   Neurological: Negative.  Negative for dizziness, tremors, weakness, light-headedness, numbness and  headaches.  Hematological: Negative.  Negative for adenopathy. Does not bruise/bleed easily.  Psychiatric/Behavioral: Negative.        Objective:   Physical Exam  Vitals reviewed. Constitutional: He is oriented to person, place, and time. He appears well-developed and well-nourished. No distress.  HENT:  Head: Normocephalic and atraumatic.  Mouth/Throat: Oropharynx is clear and moist. No oropharyngeal exudate.  Eyes: Conjunctivae are normal. Right eye exhibits no discharge. Left eye exhibits no discharge. No scleral icterus.  Neck: Normal range of motion. Neck supple. No JVD present. No tracheal deviation present. No thyromegaly present.  Cardiovascular: Normal rate, regular rhythm, normal heart sounds and intact distal pulses.  Exam reveals no gallop and no friction rub.   No murmur heard. Pulmonary/Chest: Effort normal and breath sounds normal. No stridor. No respiratory distress. He has no wheezes. He has no rales. He exhibits no tenderness.  Abdominal: Soft. Bowel sounds are normal. He exhibits no distension and no mass. There is no tenderness. There is no rebound and no guarding. Hernia confirmed negative in the right inguinal area and confirmed negative in the left inguinal area.  Genitourinary: Rectum normal, prostate normal, testes normal and penis normal. Rectal exam shows no external hemorrhoid, no internal hemorrhoid, no fissure, no mass, no tenderness and anal tone normal. Guaiac negative stool. Prostate is not enlarged and not tender. Right testis shows no mass, no swelling and no tenderness. Right testis is descended. Left testis shows no mass, no swelling and no tenderness. Left testis is descended. Circumcised. No penile erythema or penile tenderness. No discharge found.  Musculoskeletal: Normal range of motion. He exhibits no edema and no tenderness.  Lymphadenopathy:    He has no cervical adenopathy.       Right: No  inguinal adenopathy present.       Left: No inguinal  adenopathy present.  Neurological: He is oriented to person, place, and time.  Skin: Skin is warm and dry. No rash noted. He is not diaphoretic. No erythema. No pallor.  Psychiatric: He has a normal mood and affect. His behavior is normal. Judgment and thought content normal.     Lab Results  Component Value Date   WBC 9.5 02/21/2010   HGB 16.2 02/21/2010   HCT 47.6 02/21/2010   PLT 145.0* 02/21/2010   GLUCOSE 127* 12/06/2012   CHOL 113 12/06/2012   TRIG 29.0 12/06/2012   HDL 42.10 12/06/2012   LDLDIRECT 169.8 02/21/2010   LDLCALC 65 12/06/2012   ALT 23 12/06/2012   ALT 23 12/06/2012   AST 18 12/06/2012   AST 18 12/06/2012   NA 139 12/06/2012   K 5.3* 12/06/2012   CL 107 12/06/2012   CREATININE 0.9 12/06/2012   BUN 18 12/06/2012   CO2 27 12/06/2012   TSH 1.33 12/06/2012   PSA 0.67 12/06/2012   HGBA1C 7.1* 07/03/2013       Assessment & Plan:

## 2013-08-22 ENCOUNTER — Encounter: Payer: Self-pay | Admitting: Internal Medicine

## 2013-08-22 NOTE — Addendum Note (Signed)
Addended by: Estell Harpin T on: 08/22/2013 09:07 AM   Modules accepted: Orders

## 2013-09-21 ENCOUNTER — Encounter: Payer: Self-pay | Admitting: Internal Medicine

## 2013-10-26 ENCOUNTER — Encounter: Payer: Medicare HMO | Admitting: Internal Medicine

## 2013-12-26 ENCOUNTER — Encounter: Payer: Self-pay | Admitting: Internal Medicine

## 2013-12-26 ENCOUNTER — Ambulatory Visit (INDEPENDENT_AMBULATORY_CARE_PROVIDER_SITE_OTHER): Payer: Medicare HMO | Admitting: Internal Medicine

## 2013-12-26 ENCOUNTER — Other Ambulatory Visit (INDEPENDENT_AMBULATORY_CARE_PROVIDER_SITE_OTHER): Payer: Medicare HMO

## 2013-12-26 VITALS — BP 120/66 | HR 63 | Temp 98.0°F | Resp 16 | Ht 72.0 in | Wt 180.0 lb

## 2013-12-26 DIAGNOSIS — E118 Type 2 diabetes mellitus with unspecified complications: Secondary | ICD-10-CM

## 2013-12-26 LAB — BASIC METABOLIC PANEL
BUN: 13 mg/dL (ref 6–23)
CALCIUM: 9.3 mg/dL (ref 8.4–10.5)
CO2: 30 mEq/L (ref 19–32)
CREATININE: 1 mg/dL (ref 0.4–1.5)
Chloride: 104 mEq/L (ref 96–112)
GFR: 83.15 mL/min (ref >60.00)
Glucose, Bld: 122 mg/dL — ABNORMAL HIGH (ref 70–99)
Potassium: 5.2 mEq/L — ABNORMAL HIGH (ref 3.5–5.1)
Sodium: 138 mEq/L (ref 135–145)

## 2013-12-26 LAB — HEMOGLOBIN A1C: HEMOGLOBIN A1C: 6.6 % — AB (ref 4.6–6.5)

## 2013-12-26 NOTE — Progress Notes (Signed)
Pre visit review using our clinic review tool, if applicable. No additional management support is needed unless otherwise documented below in the visit note. 

## 2013-12-26 NOTE — Assessment & Plan Note (Signed)
His blood sugars are well controlled and his renal function is stable

## 2013-12-26 NOTE — Patient Instructions (Signed)

## 2013-12-26 NOTE — Progress Notes (Signed)
Subjective:    Patient ID: Angel Rodgers, male    DOB: May 13, 1945, 68 y.o.   MRN: [id number]  Diabetes He presents for his follow-up diabetic visit. He has type 2 diabetes mellitus. His disease course has been stable. There are no hypoglycemic associated symptoms. Pertinent negatives for diabetes include no blurred vision, no chest pain, no fatigue, no foot paresthesias, no foot ulcerations, no polydipsia, no polyphagia, no polyuria, no visual change, no weakness and no weight loss. There are no hypoglycemic complications. There are no diabetic complications. Current diabetic treatment includes oral agent (monotherapy). He is compliant with treatment all of the time. He is following a generally healthy diet. Meal planning includes avoidance of concentrated sweets. He has not had a previous visit with a dietitian. He participates in exercise daily. There is no change in his home blood glucose trend. An ACE inhibitor/angiotensin II receptor blocker is not being taken. He does not see a podiatrist.Eye exam is current.      Review of Systems  Constitutional: Negative.  Negative for weight loss and fatigue.  HENT: Negative.   Eyes: Negative.  Negative for blurred vision.  Respiratory: Negative.  Negative for cough, choking, chest tightness, shortness of breath and stridor.   Cardiovascular: Negative.  Negative for chest pain, palpitations and leg swelling.  Gastrointestinal: Negative.  Negative for nausea, vomiting, abdominal pain, diarrhea, constipation and blood in stool.  Endocrine: Negative.  Negative for polydipsia, polyphagia and polyuria.  Genitourinary: Negative.   Musculoskeletal: Negative.  Negative for myalgias, joint swelling, arthralgias and neck pain.  Skin: Negative.  Negative for rash.  Allergic/Immunologic: Negative.   Neurological: Negative.  Negative for weakness.  Hematological: Negative.  Negative for adenopathy. Does not bruise/bleed easily.  Psychiatric/Behavioral:  Negative.        Objective:   Physical Exam  Constitutional: He is oriented to person, place, and time. He appears well-developed and well-nourished. No distress.  HENT:  Head: Normocephalic and atraumatic.  Mouth/Throat: Oropharynx is clear and moist. No oropharyngeal exudate.  Eyes: Conjunctivae are normal. Right eye exhibits no discharge. Left eye exhibits no discharge. No scleral icterus.  Neck: Normal range of motion. Neck supple. No JVD present. No tracheal deviation present. No thyromegaly present.  Cardiovascular: Normal rate, regular rhythm, normal heart sounds and intact distal pulses.  Exam reveals no gallop and no friction rub.   No murmur heard. Pulmonary/Chest: Effort normal and breath sounds normal. No stridor. No respiratory distress. He has no wheezes. He has no rales. He exhibits no tenderness.  Abdominal: Soft. Bowel sounds are normal. He exhibits no distension and no mass. There is no tenderness. There is no rebound and no guarding.  Musculoskeletal: Normal range of motion. He exhibits no edema or tenderness.  Lymphadenopathy:    He has no cervical adenopathy.  Neurological: He is oriented to person, place, and time.  Skin: Skin is warm and dry. No rash noted. He is not diaphoretic. No erythema. No pallor.  Vitals reviewed.    Lab Results  Component Value Date   WBC 8.6 08/21/2013   HGB 15.3 08/21/2013   HCT 46.4 08/21/2013   PLT 174.0 08/21/2013   GLUCOSE 130* 08/21/2013   CHOL 145 08/21/2013   TRIG 52.0 08/21/2013   HDL 48.60 08/21/2013   LDLDIRECT 169.8 02/21/2010   LDLCALC 86 08/21/2013   ALT 30 08/21/2013   AST 27 08/21/2013   NA 140 08/21/2013   K 5.3* 08/21/2013   CL 106 08/21/2013   CREATININE 0.9 08/21/2013  BUN 15 08/21/2013   CO2 29 08/21/2013   TSH 2.75 08/21/2013   PSA 0.67 12/06/2012   HGBA1C 7.1* 07/03/2013   MICROALBUR 0.3 08/21/2013       Assessment & Plan:

## 2014-02-01 ENCOUNTER — Telehealth: Payer: Self-pay | Admitting: Internal Medicine

## 2014-02-01 MED ORDER — ACCU-CHEK FASTCLIX LANCETS MISC: Status: DC

## 2014-02-01 MED ORDER — BD SWAB SINGLE USE REGULAR PADS: MEDICATED_PAD | Status: DC

## 2014-02-01 MED ORDER — METFORMIN HCL 1000 MG PO TABS: 1000.0000 mg | ORAL_TABLET | Freq: Two times a day (BID) | ORAL | Status: DC

## 2014-02-01 MED ORDER — GLUCOSE BLOOD VI STRP: ORAL_STRIP | Status: DC

## 2014-02-01 NOTE — Telephone Encounter (Signed)
Pharmacy called in said that pt is requesting refill on metFORMIN (GLUCOPHAGE) 1000 MG tablet [[id number]]  Send to mail order

## 2014-02-01 NOTE — Telephone Encounter (Signed)
Called pt to verify pharmacy needing med sent to [facility] also need diabetic supplies. Inform pt will send....Angel Rodgers

## 2014-02-16 HISTORY — PX: COLONOSCOPY: SHX174

## 2014-06-12 DIAGNOSIS — Z86010 Personal history of colonic polyps: Secondary | ICD-10-CM | POA: Diagnosis not present

## 2014-07-05 DIAGNOSIS — Z1211 Encounter for screening for malignant neoplasm of colon: Secondary | ICD-10-CM | POA: Diagnosis not present

## 2014-07-05 DIAGNOSIS — Z86010 Personal history of colonic polyps: Secondary | ICD-10-CM | POA: Diagnosis not present

## 2014-07-05 DIAGNOSIS — K648 Other hemorrhoids: Secondary | ICD-10-CM | POA: Diagnosis not present

## 2014-07-05 DIAGNOSIS — K5730 Diverticulosis of large intestine without perforation or abscess without bleeding: Secondary | ICD-10-CM | POA: Diagnosis not present

## 2014-07-05 DIAGNOSIS — D126 Benign neoplasm of colon, unspecified: Secondary | ICD-10-CM | POA: Diagnosis not present

## 2014-07-05 DIAGNOSIS — D122 Benign neoplasm of ascending colon: Secondary | ICD-10-CM | POA: Diagnosis not present

## 2014-07-05 HISTORY — PX: COLONOSCOPY: SHX174

## 2014-07-05 LAB — HM COLONOSCOPY

## 2014-10-16 ENCOUNTER — Telehealth: Payer: Self-pay

## 2014-10-16 NOTE — Telephone Encounter (Signed)
Call to introduce AWV to see if they can make separate apt since they are coming together to CPE; Left VM with confidential number; they live in [place].  Apt is 9/14 at 1:15 and 1:45;

## 2014-10-16 NOTE — Telephone Encounter (Signed)
Wife called back and scheduled for AWV for 9/8 after her cardiac apt for her and spouse/ both at 11:15 to 11:30 Discussed some confusion over pneumonia vaccines;

## 2014-10-25 ENCOUNTER — Ambulatory Visit (INDEPENDENT_AMBULATORY_CARE_PROVIDER_SITE_OTHER): Payer: Commercial Managed Care - HMO

## 2014-10-25 VITALS — BP 110/60 | Ht 72.0 in | Wt 186.5 lb

## 2014-10-25 DIAGNOSIS — Z0000 Encounter for general adult medical examination without abnormal findings: Secondary | ICD-10-CM | POA: Diagnosis not present

## 2014-10-25 NOTE — Patient Instructions (Addendum)
[name] , Thank you for taking time to come for your Medicare Wellness Visit. I appreciate your ongoing commitment to your health goals. Please review the following plan we discussed and let me know if I can assist you in the future.   These are the goals we discussed: Goals    . control sweets     Will slow down eating sweets of which the wife will assist. Uses sugar free in cooking HFCS above;     Marland Kitchen Exercise 3x per week (30 min per time)     Walk 30 minutes three times per week       This is a list of the screening recommended for you and due dates:  Health Maintenance  Topic Date Due  .  Hepatitis C: One time screening is recommended by Center for Disease Control  (CDC) for  adults born from [birth year] through 1967.   16-Sep-1945  . Shingles Vaccine  05/06/2003  . Hemoglobin A1C  06/26/2014  . Urine Protein Check  08/22/2014  . Flu Shot  09/17/2014  . Eye exam for diabetics  09/20/2014  . Complete foot exam   12/27/2014  . Colon Cancer Screening  02/28/2019  . Tetanus Vaccine  03/07/2020  . Pneumonia vaccines  Completed    .  Health Maintenance A healthy lifestyle and preventative care can promote health and wellness.  Maintain regular health, dental, and eye exams.  Eat a healthy diet. Foods like vegetables, fruits, whole grains, low-fat dairy products, and lean protein foods contain the nutrients you need and are low in calories. Decrease your intake of foods high in solid fats, added sugars, and salt. Get information about a proper diet from your health care provider, if necessary.  Regular physical exercise is one of the most important things you can do for your health. Most adults should get at least 150 minutes of moderate-intensity exercise (any activity that increases your heart rate and causes you to sweat) each week. In addition, most adults need muscle-strengthening exercises on 2 or more days a week.   Maintain a healthy weight. The body mass index (BMI) is a  screening tool to identify possible weight problems. It provides an estimate of body fat based on height and weight. Your health care provider can find your BMI and can help you achieve or maintain a healthy weight. For males 20 years and older:  A BMI below 18.5 is considered underweight.  A BMI of 18.5 to 24.9 is normal.  A BMI of 25 to 29.9 is considered overweight.  A BMI of 30 and above is considered obese.  Maintain normal blood lipids and cholesterol by exercising and minimizing your intake of saturated fat. Eat a balanced diet with plenty of fruits and vegetables. Blood tests for lipids and cholesterol should begin at age 31 and be repeated every 5 years. If your lipid or cholesterol levels are high, you are over age 83, or you are at high risk for heart disease, you may need your cholesterol levels checked more frequently.Ongoing high lipid and cholesterol levels should be treated with medicines if diet and exercise are not working.  If you smoke, find out from your health care provider how to quit. If you do not use tobacco, do not start.  Lung cancer screening is recommended for adults aged 54-80 years who are at high risk for developing lung cancer because of a history of smoking. A yearly low-dose CT scan of the lungs is recommended for people  who have at least a 30-pack-year history of smoking and are current smokers or have quit within the past 15 years. A pack year of smoking is smoking an average of 1 pack of cigarettes a day for 1 year (for example, a 30-pack-year history of smoking could mean smoking 1 pack a day for 30 years or 2 packs a day for 15 years). Yearly screening should continue until the smoker has stopped smoking for at least 15 years. Yearly screening should be stopped for people who develop a health problem that would prevent them from having lung cancer treatment.  If you choose to drink alcohol, do not have more than 2 drinks per day. One drink is considered to be  12 oz (360 mL) of beer, 5 oz (150 mL) of wine, or 1.5 oz (45 mL) of liquor.  Avoid the use of street drugs. Do not share needles with anyone. Ask for help if you need support or instructions about stopping the use of drugs.  High blood pressure causes heart disease and increases the risk of stroke. Blood pressure should be checked at least every 1-2 years. Ongoing high blood pressure should be treated with medicines if weight loss and exercise are not effective.  If you are 3-66 years old, ask your health care provider if you should take aspirin to prevent heart disease.  Diabetes screening involves taking a blood sample to check your fasting blood sugar level. This should be done once every 3 years after age 9 if you are at a normal weight and without risk factors for diabetes. Testing should be considered at a younger age or be carried out more frequently if you are overweight and have at least 1 risk factor for diabetes.  Colorectal cancer can be detected and often prevented. Most routine colorectal cancer screening begins at the age of 15 and continues through age 99. However, your health care provider may recommend screening at an earlier age if you have risk factors for colon cancer. On a yearly basis, your health care provider may provide home test kits to check for hidden blood in the stool. A small camera at the end of a tube may be used to directly examine the colon (sigmoidoscopy or colonoscopy) to detect the earliest forms of colorectal cancer. Talk to your health care provider about this at age 76 when routine screening begins. A direct exam of the colon should be repeated every 5-10 years through age 43, unless early forms of precancerous polyps or small growths are found.  People who are at an increased risk for hepatitis B should be screened for this virus. You are considered at high risk for hepatitis B if:  You were born in a country where hepatitis B occurs often. Talk with your  health care provider about which countries are considered high risk.  Your parents were born in a high-risk country and you have not received a shot to protect against hepatitis B (hepatitis B vaccine).  You have HIV or AIDS.  You use needles to inject street drugs.  You live with, or have sex with, someone who has hepatitis B.  You are a man who has sex with other men (MSM).  You get hemodialysis treatment.  You take certain medicines for conditions like cancer, organ transplantation, and autoimmune conditions.  Hepatitis C blood testing is recommended for all people born from [birth year] through 1965 and any individual with known risk factors for hepatitis C.  Healthy men should no longer receive  prostate-specific antigen (PSA) blood tests as part of routine cancer screening. Talk to your health care provider about prostate cancer screening.  Testicular cancer screening is not recommended for adolescents or adult males who have no symptoms. Screening includes self-exam, a health care provider exam, and other screening tests. Consult with your health care provider about any symptoms you have or any concerns you have about testicular cancer.  Practice safe sex. Use condoms and avoid high-risk sexual practices to reduce the spread of sexually transmitted infections (STIs).  You should be screened for STIs, including gonorrhea and chlamydia if:  You are sexually active and are younger than 24 years.  You are older than 24 years, and your health care provider tells you that you are at risk for this type of infection.  Your sexual activity has changed since you were last screened, and you are at an increased risk for chlamydia or gonorrhea. Ask your health care provider if you are at risk.  If you are at risk of being infected with HIV, it is recommended that you take a prescription medicine daily to prevent HIV infection. This is called pre-exposure prophylaxis (PrEP). You are considered at  risk if:  You are a man who has sex with other men (MSM).  You are a heterosexual man who is sexually active with multiple partners.  You take drugs by injection.  You are sexually active with a partner who has HIV.  Talk with your health care provider about whether you are at high risk of being infected with HIV. If you choose to begin PrEP, you should first be tested for HIV. You should then be tested every 3 months for as long as you are taking PrEP.  Use sunscreen. Apply sunscreen liberally and repeatedly throughout the day. You should seek shade when your shadow is shorter than you. Protect yourself by wearing long sleeves, pants, a wide-brimmed hat, and sunglasses year round whenever you are outdoors.  Tell your health care provider of new moles or changes in moles, especially if there is a change in shape or color. Also, tell your health care provider if a mole is larger than the size of a pencil eraser.  A one-time screening for abdominal aortic aneurysm (AAA) and surgical repair of large AAAs by ultrasound is recommended for men aged 107-75 years who are current or former smokers.  Stay current with your vaccines (immunizations). Document Released: 08/01/2007 Document Revised: 02/07/2013 Document Reviewed: 06/30/2010 [facility] Patient Information 2015 [place], [place]. This information is not intended to replace advice given to you by your health care provider. Make sure you discuss any questions you have with your health care provider.

## 2014-10-25 NOTE — Progress Notes (Addendum)
Subjective:   Angel Rodgers is a 69 y.o. male who presents for Medicare Annual (Subsequent) preventive examination.  Review of Systems:   Cardiac Risk Factors include: advanced age (>33men, >60 women)HRA assessment completed during visit; Mart Patient is here for Annual Wellness Assessment The Patient was informed that this wellness visit is to identify risk and educate on how to reduce risk for increase disease through lifestyle changes.   ROS deferred to CPE exam with physician HX DM2 and hyperlipidemia; managed medically by metformin and diet; exercise Chol 113; trig 52; HDL 48 and LDL 86  BMI: 25.2 Diet; Diet; Meat; 2 to 3 vegetables; She watches carbs; One carb at a time.   Exercise; does not exercise but is not sedentary; now doing gardening, canning and freezing; Green beans, butter beans, tomatoes;  Planting greens;  Has chickens and fresh eggs  Agrees to start walking with wife as she wants to lose weight/ 30 min tiw  SAFETY/ one level  Dtr wants them to move to [place] but [place] farm. Safety reviewed for the home; including removal of clutter; clear paths through the home, eliminating clutter, railing as needed; bathroom safety; community safety; smoke detectors and firearms safety as well as sun protection;  Driving accidents; none and wears seatbelt Sun protection/ states he does wear a hat    Medication review/ no new meds  Fall assessment / no issue Gait assessment / no issue  Mobilization and Functional losses in the last year.   Urinary or fecal incontinence reviewed/ no  Counseling: Hep C screening; educated on Hep C risk; will discuss with Dr. Ronnald Ramp; will have Hep C titer drawn; also wants to discuss receipt of Hep B vaccine Colonoscopy; 02/27/2009 [name] and repeat x 5 years/ Colonoscopy just completed  2016/ Dr. Audie Clear at [facility]  Father had colon cancer / removed x 1 polyp and will schedule for next 5 years to  repeat EKG 02/2010 Hearing: $[id number]$  both ears Ophthalmology exam; due post CPE in the fall; agreed to make apt  Immunizations Due  Shingles/ had shingles; discussed vaccine  Flu vaccine ; states he does not want the flu vaccine;  Strongly declines   Current Care Team reviewed and updated Dr. Kinnie Feil at GI in [place]     Objective:     Vitals: BP 110/60 mmHg  Ht 6' (1.829 m)  Wt 186 lb 8 oz (84.596 kg)  BMI 25.29 kg/m2  Tobacco History  Smoking status  . Former Smoker -- 0.50 packs/day for 20 years  . Types: Cigarettes, Pipe  . Quit date: 02/17/1983  Smokeless tobacco  . Never Used    Comment: Smoked until 83/ smoked pipe x 5 years     Counseling given: Yes   Past Medical History  Diagnosis Date  . Benign neoplasm of colon   . Endocarditis, valve unspecified, unspecified cause   . History of rheumatic fever   . GERD (gastroesophageal reflux disease)   . Syncope and collapse    History reviewed. No pertinent past surgical history. Family History  Problem Relation Age of Onset  . Colon cancer Maternal Uncle   . Coronary artery disease Mother     AICD/PACER  . Hypertension Mother   . Diabetes Mother   . Hyperlipidemia Mother   . Coronary artery disease Father     AICD Pacer  . Cancer Father     ANAL  . Prostate cancer Neg Hx   . Stomach cancer Other   .  Diabetes Other    History  Sexual Activity  . Sexual Activity: Not on file    Outpatient Encounter Prescriptions as of 10/25/2014  Medication Sig  . ACCU-CHEK FASTCLIX LANCETS MISC Use as directed to test blood sugars dx E11.8  . Alcohol Swabs (B-D SINGLE USE SWABS REGULAR) PADS Use as directed top check blood sugars dx E11.8  . Blood Glucose Calibration (ACCU-CHEK SMARTVIEW CONTROL) LIQD Use as directed to test blood sugars dx 250.02  . Blood Glucose Monitoring Suppl (ACCU-CHEK NANO SMARTVIEW) W/DEVICE KIT USE AS DIRECTED TO TEST BLOOD SUGARS DX: 250.02  . glucose blood (ACCU-CHEK SMARTVIEW) test strip Use as  instructed to test blood sugars daily dx E11.8  . metFORMIN (GLUCOPHAGE) 1000 MG tablet Take 1 tablet (1,000 mg total) by mouth 2 (two) times daily with a meal.   No facility-administered encounter medications on file as of 10/25/2014.    Activities of Daily Living In your present state of health, do you have any difficulty performing the following activities: 10/25/2014  Hearing? N  Vision? N  Difficulty concentrating or making decisions? N  Walking or climbing stairs? N  Dressing or bathing? N  Doing errands, shopping? N  Preparing Food and eating ? N  Using the Toilet? N  In the past six months, have you accidently leaked urine? N  Do you have problems with loss of bowel control? N  Managing your Medications? N  Managing your Finances? N  Housekeeping or managing your Housekeeping? N    Patient Care Team: Etta Grandchild, MD as PCP - General (Internal Medicine) Mirian Mo, MD (Gastroenterology)    Assessment:    Assessment   Today patient counseled on age appropriate routine health concerns for screening and prevention, each reviewed and up to date or declined. Immunizations reviewed and declined flu. Labs deferred for CPE or medical fup by MD.  Risk factors for depression reviewed and negative. Hearing function and visual acuity are intact. ADLs screened and addressed as needed. Functional ability and level of safety reviewed and appropriate. Educated on memory loss and AD8 completed and score is 0. Counseling and referrals performed based on assessed risks today. Patient provided with a copy of personalized plan for preventive services and due dates   HEPATITIS SCREEN reviewed; will take Hep c antibody test with other lab draws at CPE; Wants to discuss Hep B vaccine TOBACCO minimal; ETOH - does not drink anymore; DRUG use was negative  RISK FOR CVD  BMI normal; BP in good range; lipids HDL 48; Trig 52; family hx of colon cancer but  Mother had DM/ last A1c was 6.6;  DM2  managed well;    Exercise Activities and Dietary recommendations Current Exercise Habits:: Home exercise routine, Type of exercise: walking, Time (Minutes): 30, Frequency (Times/Week): 3, Weekly Exercise (Minutes/Week): 90, Intensity: Moderate  Goals    . control sweets     Will slow down eating sweets of which the wife will assist. Uses sugar free in cooking HFCS above;     Marland Kitchen Exercise 3x per week (30 min per time)     Walk 30 minutes three times per week      Fall Risk Fall Risk  10/25/2014 12/26/2013 08/21/2013  Falls in the past year? No No No   Depression Screen PHQ 2/9 Scores 10/25/2014 12/26/2013 08/21/2013  PHQ - 2 Score 0 0 0     Cognitive Testing MMSE - Mini Mental State Exam 10/25/2014  Not completed: (No Data)  Immunization History  Administered Date(s) Administered  . Pneumococcal Conjugate-13 08/21/2013  . Pneumococcal Polysaccharide-23 10/25/2012  . Td 03/07/2010   Screening Tests Health Maintenance  Topic Date Due  . Hepatitis C Screening  1945/08/26  . ZOSTAVAX  05/06/2003  . HEMOGLOBIN A1C  06/26/2014  . URINE MICROALBUMIN  08/22/2014  . INFLUENZA VACCINE  09/17/2014  . OPHTHALMOLOGY EXAM  09/20/2014  . FOOT EXAM  12/27/2014  . COLONOSCOPY  02/28/2019  . TETANUS/TDAP  03/07/2020  . PNA vac Low Risk Adult  Completed      Plan:   1. Refuses flu 2. To discuss Hep c antibody titer with Dr. Ronnald Ramp and Hep B vaccine;  3. Agrees to have eye exam  Agreed to walking program;   During the course of the visit the patient was educated and counseled about the following appropriate screening and preventive services:   Vaccines to include Pneumoccal, Influenza, Hepatitis B, Td, Zostavax, HCV/ declines flu and shingles at this time; states he had shingles; will discuss with Dr. Ronnald Ramp; educated that he could still take shingles vaccine post episode.  Electrocardiogram  02/2010  Cardiovascular Disease/ not noted to date  Colorectal cancer screening/ completed  this year; 1 polyp removed  Diabetes screening/ For cpe in one week  Glaucoma screening/ needs eye exam  Nutrition counseling / reviewed diet   Patient Instructions (the written plan) was given to the patient.   JHERD,EYCXK, RN  10/25/2014     Medical screening examination/treatment/procedure(s) were performed by non-physician practitioner and as supervising physician I was immediately available for consultation/collaboration. I agree with above. Scarlette Calico, MD

## 2014-10-31 ENCOUNTER — Encounter: Payer: Self-pay | Admitting: Internal Medicine

## 2014-10-31 ENCOUNTER — Ambulatory Visit (INDEPENDENT_AMBULATORY_CARE_PROVIDER_SITE_OTHER): Payer: Commercial Managed Care - HMO | Admitting: Internal Medicine

## 2014-10-31 ENCOUNTER — Other Ambulatory Visit (INDEPENDENT_AMBULATORY_CARE_PROVIDER_SITE_OTHER): Payer: Commercial Managed Care - HMO

## 2014-10-31 VITALS — BP 116/64 | HR 75 | Temp 97.8°F | Ht 72.0 in | Wt 184.0 lb

## 2014-10-31 DIAGNOSIS — E118 Type 2 diabetes mellitus with unspecified complications: Secondary | ICD-10-CM

## 2014-10-31 DIAGNOSIS — E785 Hyperlipidemia, unspecified: Secondary | ICD-10-CM | POA: Diagnosis not present

## 2014-10-31 DIAGNOSIS — Z0000 Encounter for general adult medical examination without abnormal findings: Secondary | ICD-10-CM

## 2014-10-31 DIAGNOSIS — N400 Enlarged prostate without lower urinary tract symptoms: Secondary | ICD-10-CM

## 2014-10-31 LAB — CBC WITH DIFFERENTIAL/PLATELET
BASOS PCT: 0.7 % (ref 0.0–3.0)
Basophils Absolute: 0.1 10*3/uL (ref 0.0–0.1)
EOS ABS: 0.4 10*3/uL (ref 0.0–0.7)
EOS PCT: 4.4 % (ref 0.0–5.0)
HCT: 47.5 % (ref 39.0–52.0)
Hemoglobin: 15.9 g/dL (ref 13.0–17.0)
LYMPHS ABS: 3.1 10*3/uL (ref 0.7–4.0)
Lymphocytes Relative: 31.6 % (ref 12.0–46.0)
MCHC: 33.4 g/dL (ref 30.0–36.0)
MCV: 92.4 fl (ref 78.0–100.0)
MONO ABS: 0.9 10*3/uL (ref 0.1–1.0)
Monocytes Relative: 9.7 % (ref 3.0–12.0)
NEUTROS ABS: 5.2 10*3/uL (ref 1.4–7.7)
Neutrophils Relative %: 53.6 % (ref 43.0–77.0)
PLATELETS: 163 10*3/uL (ref 150.0–400.0)
RBC: 5.14 Mil/uL (ref 4.22–5.81)
RDW: 13.4 % (ref 11.5–15.5)
WBC: 9.8 10*3/uL (ref 4.0–10.5)

## 2014-10-31 LAB — URINALYSIS, ROUTINE W REFLEX MICROSCOPIC
BILIRUBIN URINE: NEGATIVE
HGB URINE DIPSTICK: NEGATIVE
Ketones, ur: NEGATIVE
LEUKOCYTES UA: NEGATIVE
NITRITE: NEGATIVE
RBC / HPF: NONE SEEN (ref 0–?)
Specific Gravity, Urine: 1.025 (ref 1.000–1.030)
TOTAL PROTEIN, URINE-UPE24: NEGATIVE
URINE GLUCOSE: NEGATIVE
Urobilinogen, UA: 0.2 (ref 0.0–1.0)
pH: 6 (ref 5.0–8.0)

## 2014-10-31 LAB — COMPREHENSIVE METABOLIC PANEL
ALBUMIN: 4.4 g/dL (ref 3.5–5.2)
ALT: 50 U/L (ref 0–53)
AST: 32 U/L (ref 0–37)
Alkaline Phosphatase: 91 U/L (ref 39–117)
BUN: 19 mg/dL (ref 6–23)
CHLORIDE: 104 meq/L (ref 96–112)
CO2: 28 meq/L (ref 19–32)
CREATININE: 0.83 mg/dL (ref 0.40–1.50)
Calcium: 10.1 mg/dL (ref 8.4–10.5)
GFR: 96.94 mL/min (ref >60.00)
GLUCOSE: 93 mg/dL (ref 70–99)
Potassium: 5.1 mEq/L (ref 3.5–5.1)
SODIUM: 139 meq/L (ref 135–145)
Total Bilirubin: 0.6 mg/dL (ref 0.2–1.2)
Total Protein: 7.1 g/dL (ref 6.0–8.3)

## 2014-10-31 LAB — LIPID PANEL
CHOL/HDL RATIO: 4
CHOLESTEROL: 180 mg/dL (ref 0–200)
HDL: 44.2 mg/dL (ref >39.00)
LDL CALC: 98 mg/dL (ref 0–99)
NONHDL: 136.28
Triglycerides: 193 mg/dL — ABNORMAL HIGH (ref 0.0–149.0)
VLDL: 38.6 mg/dL (ref 0.0–40.0)

## 2014-10-31 LAB — TSH: TSH: 1.65 u[IU]/mL (ref 0.35–4.50)

## 2014-10-31 LAB — MICROALBUMIN / CREATININE URINE RATIO
CREATININE, U: 109.3 mg/dL
Microalb Creat Ratio: 0.6 mg/g (ref 0.0–30.0)

## 2014-10-31 LAB — HEMOGLOBIN A1C: Hgb A1c MFr Bld: 7 % — ABNORMAL HIGH (ref 4.6–6.5)

## 2014-10-31 LAB — PSA: PSA: 0.85 ng/mL (ref 0.10–4.00)

## 2014-10-31 LAB — FECAL OCCULT BLOOD, GUAIAC: Fecal Occult Blood: NEGATIVE

## 2014-10-31 MED ORDER — BD SWAB SINGLE USE REGULAR PADS: MEDICATED_PAD | Status: AC

## 2014-10-31 MED ORDER — ACCU-CHEK FASTCLIX LANCETS MISC: Status: DC

## 2014-10-31 MED ORDER — METFORMIN HCL 1000 MG PO TABS: 1000.0000 mg | ORAL_TABLET | Freq: Two times a day (BID) | ORAL | Status: DC

## 2014-10-31 MED ORDER — ACCU-CHEK SMARTVIEW CONTROL VI LIQD: Status: AC

## 2014-10-31 MED ORDER — ASPIRIN EC 81 MG PO TBEC: 81.0000 mg | DELAYED_RELEASE_TABLET | Freq: Every day | ORAL | Status: DC

## 2014-10-31 MED ORDER — ACCU-CHEK NANO SMARTVIEW W/DEVICE KIT: PACK | Status: DC

## 2014-10-31 MED ORDER — GLUCOSE BLOOD VI STRP: ORAL_STRIP | Status: DC

## 2014-10-31 NOTE — Progress Notes (Signed)
Pre visit review using our clinic review tool, if applicable. No additional management support is needed unless otherwise documented below in the visit note. 

## 2014-10-31 NOTE — Patient Instructions (Signed)

## 2014-10-31 NOTE — Progress Notes (Addendum)
Subjective:  Patient ID: Angel Rodgers, male    DOB: 08/20/1945  Age: 69 y.o. MRN: [id number]  CC: Annual Exam and Diabetes   HPI Angel Rodgers presents for a complete physical, follow-up on diabetes, hypertension and hypercholesterol. He requests updated labs and refills on his medications. He offers no complaints today. He refused to get a flu vaccine today.  Outpatient Prescriptions Prior to Visit  Medication Sig Dispense Refill  . ACCU-CHEK FASTCLIX LANCETS MISC Use as directed to test blood sugars dx E11.8 102 each 3  . Alcohol Swabs (B-D SINGLE USE SWABS REGULAR) PADS Use as directed top check blood sugars dx E11.8 100 each 3  . Blood Glucose Calibration (ACCU-CHEK SMARTVIEW CONTROL) LIQD Use as directed to test blood sugars dx 250.02 3 each 3  . Blood Glucose Monitoring Suppl (ACCU-CHEK NANO SMARTVIEW) W/DEVICE KIT USE AS DIRECTED TO TEST BLOOD SUGARS DX: 250.02 1 kit 0  . glucose blood (ACCU-CHEK SMARTVIEW) test strip Use as instructed to test blood sugars daily dx E11.8 100 each 3  . metFORMIN (GLUCOPHAGE) 1000 MG tablet Take 1 tablet (1,000 mg total) by mouth 2 (two) times daily with a meal. 180 tablet 3   No facility-administered medications prior to visit.    ROS Review of Systems  Constitutional: Negative.  Negative for fever, chills, diaphoresis, appetite change and fatigue.  HENT: Negative.   Eyes: Negative.   Respiratory: Negative.  Negative for cough, choking, chest tightness, shortness of breath and stridor.   Cardiovascular: Negative.  Negative for chest pain, palpitations and leg swelling.  Gastrointestinal: Negative.  Negative for nausea, vomiting, abdominal pain, diarrhea, constipation and blood in stool.  Endocrine: Negative.  Negative for polydipsia, polyphagia and polyuria.  Genitourinary: Negative.  Negative for dysuria, urgency, frequency, hematuria and difficulty urinating.  Musculoskeletal: Negative.  Negative for myalgias, back pain, arthralgias and neck  pain.  Skin: Negative.   Allergic/Immunologic: Negative.   Neurological: Negative.  Negative for dizziness, tremors, weakness, light-headedness, numbness and headaches.  Hematological: Negative.  Negative for adenopathy. Does not bruise/bleed easily.  Psychiatric/Behavioral: Negative.     Objective:  BP 116/64 mmHg  Pulse 75  Temp(Src) 97.8 F (36.6 C) (Oral)  Ht 6' (1.829 m)  Wt 184 lb (83.462 kg)  BMI 24.95 kg/m2  SpO2 96%  BP Readings from Last 3 Encounters:  10/31/14 116/64  10/25/14 110/60  12/26/13 120/66    Wt Readings from Last 3 Encounters:  10/31/14 184 lb (83.462 kg)  10/25/14 186 lb 8 oz (84.596 kg)  12/26/13 180 lb (81.647 kg)    Physical Exam  Constitutional: He is oriented to person, place, and time. No distress.  HENT:  Mouth/Throat: Oropharynx is clear and moist. No oropharyngeal exudate.  Eyes: Conjunctivae are normal. Right eye exhibits no discharge. Left eye exhibits no discharge. No scleral icterus.  Neck: Normal range of motion. Neck supple. No JVD present. No tracheal deviation present. No thyromegaly present.  Cardiovascular: Normal rate, regular rhythm, normal heart sounds and intact distal pulses.  Exam reveals no gallop and no friction rub.   No murmur heard. Pulmonary/Chest: Effort normal and breath sounds normal. No stridor. No respiratory distress. He has no wheezes. He has no rales. He exhibits no tenderness.  Abdominal: Soft. Bowel sounds are normal. He exhibits no distension and no mass. There is no tenderness. There is no rebound and no guarding. Hernia confirmed negative in the right inguinal area and confirmed negative in the left inguinal area.  Genitourinary: Rectum normal, testes  normal and penis normal. Rectal exam shows no external hemorrhoid, no internal hemorrhoid, no fissure, no mass, no tenderness and anal tone normal. Guaiac negative stool. Prostate is enlarged (1+ smooth symm BPH). Prostate is not tender. Right testis shows no  mass, no swelling and no tenderness. Right testis is descended. Left testis shows no mass, no swelling and no tenderness. Left testis is descended. Uncircumcised. No penile erythema or penile tenderness. No discharge found.  Musculoskeletal: Normal range of motion. He exhibits no edema or tenderness.  Lymphadenopathy:    He has no cervical adenopathy.       Right: No inguinal adenopathy present.       Left: No inguinal adenopathy present.  Neurological: He is oriented to person, place, and time.  Skin: Skin is warm and dry. No rash noted. He is not diaphoretic. No erythema. No pallor.  Psychiatric: He has a normal mood and affect. His behavior is normal. Judgment and thought content normal.  Vitals reviewed.   Lab Results  Component Value Date   WBC 9.8 10/31/2014   HGB 15.9 10/31/2014   HCT 47.5 10/31/2014   PLT 163.0 10/31/2014   GLUCOSE 93 10/31/2014   CHOL 180 10/31/2014   TRIG 193.0* 10/31/2014   HDL 44.20 10/31/2014   LDLDIRECT 169.8 02/21/2010   LDLCALC 98 10/31/2014   ALT 50 10/31/2014   AST 32 10/31/2014   NA 139 10/31/2014   K 5.1 10/31/2014   CL 104 10/31/2014   CREATININE 0.83 10/31/2014   BUN 19 10/31/2014   CO2 28 10/31/2014   TSH 1.65 10/31/2014   PSA 0.85 10/31/2014   HGBA1C 7.0* 10/31/2014   MICROALBUR <0.7 10/31/2014    No results found.  Assessment & Plan:   Angel Rodgers was seen today for annual exam and diabetes.  Diagnoses and all orders for this visit:  Type 2 diabetes mellitus with manifestations- his blood sugars are well-controlled, his renal function is normal. He was referred for his annual eye exam. His medications were refilled. -     Lipid panel; Future -     Comprehensive metabolic panel; Future -     CBC with Differential/Platelet; Future -     Microalbumin / creatinine urine ratio; Future -     Hemoglobin A1c; Future -     Urinalysis, Routine w reflex microscopic (not at [facility]); Future -     Ambulatory referral to Ophthalmology -      aspirin EC 81 MG tablet; Take 1 tablet (81 mg total) by mouth daily. -     metFORMIN (GLUCOPHAGE) 1000 MG tablet; Take 1 tablet (1,000 mg total) by mouth 2 (two) times daily with a meal. -     ACCU-CHEK FASTCLIX LANCETS MISC; Use BID dx E11.8 -     Alcohol Swabs (B-D SINGLE USE SWABS REGULAR) PADS; Use as directed top check blood sugars dx E11.8 -     Blood Glucose Calibration (ACCU-CHEK SMARTVIEW CONTROL) LIQD; Use BID dx e11.8 -     Blood Glucose Monitoring Suppl (ACCU-CHEK NANO SMARTVIEW) W/DEVICE KIT; USE BID DX: e11.8 -     glucose blood (ACCU-CHEK SMARTVIEW) test strip; Use BID dx E11.8  Hyperlipidemia with target LDL less than 70- he will start taking a baby aspirin a day for risk reduction, he has not achieved his LDL goal of 70 so I haved asked him to start a statin. -     Lipid panel; Future -     Comprehensive metabolic panel; Future -  TSH; Future -     aspirin EC 81 MG tablet; Take 1 tablet (81 mg total) by mouth daily.  Routine health maintenance -     Hepatitis C antibody; Future  BPH (benign prostatic hypertrophy)- he has no symptoms that need to be treated and the PSA is not suspicious for prostate cancer. Continue to monitor this. -     Urinalysis, Routine w reflex microscopic (not at [facility]); Future -     PSA; Future   I have changed Mr. Cerda's ACCU-CHEK FASTCLIX LANCETS, ACCU-CHEK SMARTVIEW CONTROL, ACCU-CHEK NANO SMARTVIEW, and glucose blood. I am also having him start on aspirin EC. Additionally, I am having him maintain his metFORMIN and B-D SINGLE USE SWABS REGULAR.  Meds ordered this encounter  Medications  . aspirin EC 81 MG tablet    Sig: Take 1 tablet (81 mg total) by mouth daily.    Dispense:  90 tablet    Refill:  3  . metFORMIN (GLUCOPHAGE) 1000 MG tablet    Sig: Take 1 tablet (1,000 mg total) by mouth 2 (two) times daily with a meal.    Dispense:  180 tablet    Refill:  1  . ACCU-CHEK FASTCLIX LANCETS MISC    Sig: Use BID dx E11.8    Dispense:   102 each    Refill:  3  . Alcohol Swabs (B-D SINGLE USE SWABS REGULAR) PADS    Sig: Use as directed top check blood sugars dx E11.8    Dispense:  100 each    Refill:  3  . Blood Glucose Calibration (ACCU-CHEK SMARTVIEW CONTROL) LIQD    Sig: Use BID dx e11.8    Dispense:  3 each    Refill:  3  . Blood Glucose Monitoring Suppl (ACCU-CHEK NANO SMARTVIEW) W/DEVICE KIT    Sig: USE BID DX: e11.8    Dispense:  1 kit    Refill:  0  . glucose blood (ACCU-CHEK SMARTVIEW) test strip    Sig: Use BID dx E11.8    Dispense:  100 each    Refill:  3   See AVS for instructions about healthy living and anticipatory guidance.  Follow-up: Return in about 4 months (around 03/02/2015).  Scarlette Calico, MD

## 2014-11-01 ENCOUNTER — Encounter: Payer: Self-pay | Admitting: Internal Medicine

## 2014-11-01 LAB — HEPATITIS C ANTIBODY: HCV Ab: NEGATIVE

## 2014-11-01 MED ORDER — ATORVASTATIN CALCIUM 20 MG PO TABS: 20.0000 mg | ORAL_TABLET | Freq: Every day | ORAL | Status: DC

## 2014-11-01 NOTE — Addendum Note (Signed)
Addended by: Janith Lima on: 11/01/2014 02:45 PM   Modules accepted: Miquel Dunn

## 2014-11-01 NOTE — Assessment & Plan Note (Signed)

## 2014-11-29 DIAGNOSIS — E119 Type 2 diabetes mellitus without complications: Secondary | ICD-10-CM | POA: Diagnosis not present

## 2014-11-29 DIAGNOSIS — H2513 Age-related nuclear cataract, bilateral: Secondary | ICD-10-CM | POA: Diagnosis not present

## 2014-11-29 DIAGNOSIS — H524 Presbyopia: Secondary | ICD-10-CM | POA: Diagnosis not present

## 2014-11-29 DIAGNOSIS — H25013 Cortical age-related cataract, bilateral: Secondary | ICD-10-CM | POA: Diagnosis not present

## 2014-12-03 NOTE — Addendum Note (Signed)
Addended by: Janith Lima on: 12/03/2014 07:40 AM   Modules accepted: Miquel Dunn

## 2014-12-19 LAB — HM DIABETES EYE EXAM

## 2014-12-26 ENCOUNTER — Other Ambulatory Visit: Payer: Self-pay | Admitting: Internal Medicine

## 2015-03-11 ENCOUNTER — Other Ambulatory Visit: Payer: Self-pay | Admitting: Internal Medicine

## 2015-09-19 ENCOUNTER — Other Ambulatory Visit: Payer: Self-pay | Admitting: Internal Medicine

## 2015-11-05 ENCOUNTER — Ambulatory Visit (INDEPENDENT_AMBULATORY_CARE_PROVIDER_SITE_OTHER): Payer: Commercial Managed Care - HMO

## 2015-11-05 VITALS — BP 110/60 | HR 77 | Ht 72.0 in | Wt 184.5 lb

## 2015-11-05 DIAGNOSIS — Z0000 Encounter for general adult medical examination without abnormal findings: Secondary | ICD-10-CM

## 2015-11-05 NOTE — Progress Notes (Signed)
Subjective:   Angel Rodgers is a 70 y.o. male who presents for Medicare Annual/Subsequent preventive examination.  HRA assessment completed during this visit with Angel Rodgers   The Patient was informed that the wellness visit is to identify future health risk and educate and initiate measures that can reduce risk for increased disease through the lifespan.    Future plans to go to [place]; Spring 2018  Wife states he has had a lot of gas; c/o of flatulence; Wife states he vomited x1 approx  3 weeks ago; vomited one other time. Pain in mid gastric area at hs that stops when he lies on abdomen. Will fup with Dr. Ronnald Ramp at [facility] in October; wondering if metformin is causing some of his symptoms; C/o about cooked dinners now,  Goes to GI in [facility]; would be willing to change to [employer]. To make OV apt prior to CPE if symptoms become worse of if he has more vomiting and pain.   ROS deferred to CPE exam with physician 10/12 HX DM2 and hyperlipidemia; managed medically by metformin and diet; exercise Chol 180; Trig 193; HDL 44; LDL 98   BMI: 25.2 Diet; Diet; Meat; 2 to 3 vegetables; She watches carbs; Cooks at home; does go to [employer] on occasion; [facility] or other. Has not lost weight with acute c/o   Exercise; does not exercise but is not sedentary; keeps up a large garden;  canning and freezing; Green beans, butter beans, tomatoes; Planting greens;  Has chickens and fresh eggs as well  Now planting winter vegetables     SAFETY/ one level home  Dtr wants them to move to [place] but they still enjoy the farm.  Safety reviewed for the home; including removal of clutter; clear paths through the home, eliminating clutter, railing as needed; bathroom safety; community safety; smoke detectors and firearms safety as well as sun protection;  Driving accidents; none and wears seatbelt Sun protection/ states he does wear a hat    Medication review/ no new meds   Fall assessment / no  issue Gait assessment / no issue   Mobilization and Functional losses in the last year. No Does climb ladders; wife with him when cleaning the gutters;  Does ride 5 wheelers as well;    Risk for Depression reviewed:  Any emotional problems? Anxious, depressed, irritable, sad or blue? no Denies feeling depressed or hopeless; voices pleasure in daily life How many social activities have you been engaged in within the last 2 weeks? no Lots of energy, enjoys life   Sleep pattern; sleeps some during the day     Urinary or fecal incontinence reviewed/ no Ad8 score reviewed for issues;  Issues making decisions; no  Less interest in hobbies / activities" no  Repeats questions, stories; family complaining: NO  Trouble using ordinary gadgets; microwave; computer: no  Forgets the month or year: no  Mismanaging finances: no  Missing apt: no but does write them down  Daily problems with thinking of memory NO Ad8 score is 0   Counseling Overdue Maintenance Zostavax; had shingles; educated 2016; Repeated to check for coverage in the office or at the pharmacy and can discuss with Dr. Ronnald Ramp.  Eye exam ;due in October A1c; deferred to CPE 10/12 Flu: declines   Colonoscopy;  Colonoscopy just completed  2016/ Dr. Audie Clear at [facility]. Due 05/2019 Fup with Dr. Audie Clear   (Epic states 2026)  Father had colon cancer / removed x 1 polyp and will  schedule for next 5 years to repeat EKG 02/2010 Hearing: _0  both ears    Current Care Team reviewed and updated Dr. Kinnie Feil at GI in [place] Cardiac Risk Factors include: advanced age (>69mn, >>45women);dyslipidemia;diabetes mellitus;family history of premature cardiovascular disease;male gender     Objective:    Vitals: BP 110/60   Pulse 77   Ht 6' (1.829 m)   Wt 184 lb 8 oz (83.7 kg)   SpO2 97%   BMI 25.02 kg/m   Body mass index is 25.02 kg/m.  Tobacco History  Smoking Status  . Former Smoker  . Packs/day: 0.50  .  Years: 20.00  . Types: Cigarettes, Pipe  . Quit date: 02/17/1983  Smokeless Tobacco  . Never Used    Comment: Smoked until 83/ smoked pipe x 5 years     Counseling given: Yes   Past Medical History:  Diagnosis Date  . Benign neoplasm of colon   . Endocarditis, valve unspecified, unspecified cause   . GERD (gastroesophageal reflux disease)   . History of rheumatic fever   . Syncope and collapse    No past surgical history on file. Family History  Problem Relation Age of Onset  . Colon cancer Maternal Uncle   . Coronary artery disease Mother     AICD/PACER  . Hypertension Mother   . Diabetes Mother   . Hyperlipidemia Mother   . Coronary artery disease Father     AICD Pacer  . Cancer Father     ANAL  . Prostate cancer Neg Hx   . Stomach cancer Other   . Diabetes Other    History  Sexual Activity  . Sexual activity: Not on file    Outpatient Encounter Prescriptions as of 11/05/2015  Medication Sig  . ACCU-CHEK FASTCLIX LANCETS MISC Use BID dx E11.8  . Alcohol Swabs (B-D SINGLE USE SWABS REGULAR) PADS Use as directed top check blood sugars dx E11.8  . aspirin EC 81 MG tablet Take 1 tablet (81 mg total) by mouth daily.  .Marland Kitchenatorvastatin (LIPITOR) 20 MG tablet Take 1 tablet (20 mg total) by mouth daily.  . Blood Glucose Calibration (ACCU-CHEK SMARTVIEW CONTROL) LIQD Use BID dx e11.8  . Blood Glucose Monitoring Suppl (ACCU-CHEK NANO SMARTVIEW) W/DEVICE KIT TEST BLOOD GLUCOSE TWICE DAILY  (FOR DIABETES, E11.8)  . glucose blood (ACCU-CHEK SMARTVIEW) test strip Use twice daily to check blood sugar Dx E11.8  . metFORMIN (GLUCOPHAGE) 1000 MG tablet TAKE 1 TABLET TWICE DAILY WITH MEALS   No facility-administered encounter medications on file as of 11/05/2015.     Activities of Daily Living In your present state of health, do you have any difficulty performing the following activities: 11/05/2015  Hearing? N  Vision? N  Difficulty concentrating or making decisions? N  Walking or  climbing stairs? N  Dressing or bathing? N  Doing errands, shopping? N  Preparing Food and eating ? N  Using the Toilet? N  In the past six months, have you accidently leaked urine? N  Do you have problems with loss of bowel control? (No Data)  Managing your Medications? N  Managing your Finances? N  Housekeeping or managing your Housekeeping? N  Some recent data might be hidden    Patient Care Team: TJanith Lima MD as PCP - General (Internal Medicine) AAlger Memos MD (Gastroenterology)   Assessment:     Exercise Activities and Dietary recommendations Current Exercise Habits: Home exercise routine, Time (Minutes): 60, Frequency (Times/Week): 3, Weekly Exercise (Minutes/Week):  180, Intensity: Moderate  Goals    . control sweets (pt-stated)          Will slow down eating sweets of which the wife will assist. Uses sugar free in cooking HFCS above;     Marland Kitchen Exercise 3x per week (30 min per time)          Walk 30 minutes three times per week      Fall Risk Fall Risk  11/05/2015 10/31/2014 10/25/2014 12/26/2013 08/21/2013  Falls in the past year? _0    Depression Screen PHQ 2/9 Scores 11/05/2015 10/31/2014 10/25/2014 12/26/2013  PHQ - 2 Score 0 0 0 0    Cognitive Testing MMSE - Mini Mental State Exam 11/05/2015 10/25/2014  Not completed: (No Data) (No Data)    Immunization History  Administered Date(s) Administered  . Pneumococcal Conjugate-13 08/21/2013  . Pneumococcal Polysaccharide-23 10/25/2012  . Td 03/07/2010   Screening Tests Health Maintenance  Topic Date Due  . ZOSTAVAX  05/06/2003  . HEMOGLOBIN A1C  04/30/2015  . FOOT EXAM  10/31/2015  . URINE MICROALBUMIN  10/31/2015  . OPHTHALMOLOGY EXAM  12/17/2015 (Originally 09/20/2014)  . INFLUENZA VACCINE  09/16/2016 (Originally 09/17/2015)  . TETANUS/TDAP  03/07/2020  . COLONOSCOPY  06/13/2024  . Hepatitis C Screening  Completed  . PNA vac Low Risk Adult  Completed      Plan:     To see Dr. Ronnald Ramp for  annual fup in October  Declines flu shot today  Will fup sooner if stomach issues continue or worsen.  During the course of the visit the patient was educated and counseled about the following appropriate screening and preventive services:   Vaccines to include Pneumoccal, Influenza, Hepatitis B, Td, Zostavax, HCV/ declines flu  Electrocardiogram  Cardiovascular Disease  Colorectal cancer screening  Diabetes screening   Prostate Cancer Screening  Glaucoma screening  Nutrition counseling   Smoking cessation counseling  Patient Instructions (the written plan) was given to the patient.    Wynetta Fines, RN  11/05/2015

## 2015-11-05 NOTE — Progress Notes (Signed)
Medical screening examination/treatment/procedure(s) were performed by non-physician practitioner and as supervising physician I was immediately available for consultation/collaboration. I agree with above. Rennie Hack A Polina Burmaster, MD 

## 2015-11-05 NOTE — Patient Instructions (Addendum)
[name] , Thank you for taking time to come for your Medicare Wellness Visit. I appreciate your ongoing commitment to your health goals. Please review the following plan we discussed and let me know if I can assist you in the future.   Will discuss GI symptoms to discuss with Dr. Ronnald Ramp   (Declines flu)  These are the goals we discussed: Goals    . control sweets (pt-stated)          Will slow down eating sweets of which the wife will assist. Uses sugar free in cooking HFCS above;     Marland Kitchen Exercise 3x per week (30 min per time)          Walk 30 minutes three times per week       This is a list of the screening recommended for you and due dates:  Health Maintenance  Topic Date Due  . Shingles Vaccine  05/06/2003  . Hemoglobin A1C  04/30/2015  . Complete foot exam   10/31/2015  . Urine Protein Check  10/31/2015  . Eye exam for diabetics  12/17/2015*  . Flu Shot  09/16/2016*  . Tetanus Vaccine  03/07/2020  . Colon Cancer Screening  06/13/2024  .  Hepatitis C: One time screening is recommended by Center for Disease Control  (CDC) for  adults born from [birth year] through 1967.   Completed  . Pneumonia vaccines  Completed  *Topic was postponed. The date shown is not the original due date.    Fall Prevention in the Home  Falls can cause injuries. They can happen to people of all ages. There are many things you can do to make your home safe and to help prevent falls.  WHAT CAN I DO ON THE OUTSIDE OF MY HOME?  Regularly fix the edges of walkways and driveways and fix any cracks.  Remove anything that might make you trip as you walk through a door, such as a raised step or threshold.  Trim any bushes or trees on the path to your home.  Use bright outdoor lighting.  Clear any walking paths of anything that might make someone trip, such as rocks or tools.  Regularly check to see if handrails are loose or broken. Make sure that both sides of any steps have handrails.  Any raised  decks and porches should have guardrails on the edges.  Have any leaves, snow, or ice cleared regularly.  Use sand or salt on walking paths during winter.  Clean up any spills in your garage right away. This includes oil or grease spills. WHAT CAN I DO IN THE BATHROOM?   Use night lights.  Install grab bars by the toilet and in the tub and shower. Do not use towel bars as grab bars.  Use non-skid mats or decals in the tub or shower.  If you need to sit down in the shower, use a plastic, non-slip stool.  Keep the floor dry. Clean up any water that spills on the floor as soon as it happens.  Remove soap buildup in the tub or shower regularly.  Attach bath mats securely with double-sided non-slip rug tape.  Do not have throw rugs and other things on the floor that can make you trip. WHAT CAN I DO IN THE BEDROOM?  Use night lights.  Make sure that you have a light by your bed that is easy to reach.  Do not use any sheets or blankets that are too big for your bed. They  should not hang down onto the floor.  Have a firm chair that has side arms. You can use this for support while you get dressed.  Do not have throw rugs and other things on the floor that can make you trip. WHAT CAN I DO IN THE KITCHEN?  Clean up any spills right away.  Avoid walking on wet floors.  Keep items that you use a lot in easy-to-reach places.  If you need to reach something above you, use a strong step stool that has a grab bar.  Keep electrical cords out of the way.  Do not use floor polish or wax that makes floors slippery. If you must use wax, use non-skid floor wax.  Do not have throw rugs and other things on the floor that can make you trip. WHAT CAN I DO WITH MY STAIRS?  Do not leave any items on the stairs.  Make sure that there are handrails on both sides of the stairs and use them. Fix handrails that are broken or loose. Make sure that handrails are as long as the stairways.  Check  any carpeting to make sure that it is firmly attached to the stairs. Fix any carpet that is loose or worn.  Avoid having throw rugs at the top or bottom of the stairs. If you do have throw rugs, attach them to the floor with carpet tape.  Make sure that you have a light switch at the top of the stairs and the bottom of the stairs. If you do not have them, ask someone to add them for you. WHAT ELSE CAN I DO TO HELP PREVENT FALLS?  Wear shoes that:  Do not have high heels.  Have rubber bottoms.  Are comfortable and fit you well.  Are closed at the toe. Do not wear sandals.  If you use a stepladder:  Make sure that it is fully opened. Do not climb a closed stepladder.  Make sure that both sides of the stepladder are locked into place.  Ask someone to hold it for you, if possible.  Clearly mark and make sure that you can see:  Any grab bars or handrails.  First and last steps.  Where the edge of each step is.  Use tools that help you move around (mobility aids) if they are needed. These include:  Canes.  Walkers.  Scooters.  Crutches.  Turn on the lights when you go into a dark area. Replace any light bulbs as soon as they burn out.  Set up your furniture so you have a clear path. Avoid moving your furniture around.  If any of your floors are uneven, fix them.  If there are any pets around you, be aware of where they are.  Review your medicines with your doctor. Some medicines can make you feel dizzy. This can increase your chance of falling. Ask your doctor what other things that you can do to help prevent falls.   This information is not intended to replace advice given to you by your health care provider. Make sure you discuss any questions you have with your health care provider.   Document Released: 11/29/2008 Document Revised: 06/19/2014 Document Reviewed: 03/09/2014 Elsevier Interactive Patient Education 2016 [place] Maintenance, Male A  healthy lifestyle and preventative care can promote health and wellness.  Maintain regular health, dental, and eye exams.  Eat a healthy diet. Foods like vegetables, fruits, whole grains, low-fat dairy products, and lean protein foods contain the nutrients  you need and are low in calories. Decrease your intake of foods high in solid fats, added sugars, and salt. Get information about a proper diet from your health care provider, if necessary.  Regular physical exercise is one of the most important things you can do for your health. Most adults should get at least 150 minutes of moderate-intensity exercise (any activity that increases your heart rate and causes you to sweat) each week. In addition, most adults need muscle-strengthening exercises on 2 or more days a week.   Maintain a healthy weight. The body mass index (BMI) is a screening tool to identify possible weight problems. It provides an estimate of body fat based on height and weight. Your health care provider can find your BMI and can help you achieve or maintain a healthy weight. For males 20 years and older:  A BMI below 18.5 is considered underweight.  A BMI of 18.5 to 24.9 is normal.  A BMI of 25 to 29.9 is considered overweight.  A BMI of 30 and above is considered obese.  Maintain normal blood lipids and cholesterol by exercising and minimizing your intake of saturated fat. Eat a balanced diet with plenty of fruits and vegetables. Blood tests for lipids and cholesterol should begin at age 62 and be repeated every 5 years. If your lipid or cholesterol levels are high, you are over age 73, or you are at high risk for heart disease, you may need your cholesterol levels checked more frequently.Ongoing high lipid and cholesterol levels should be treated with medicines if diet and exercise are not working.  If you smoke, find out from your health care provider how to quit. If you do not use tobacco, do not start.  Lung cancer  screening is recommended for adults aged 62-80 years who are at high risk for developing lung cancer because of a history of smoking. A yearly low-dose CT scan of the lungs is recommended for people who have at least a 30-pack-year history of smoking and are current smokers or have quit within the past 15 years. A pack year of smoking is smoking an average of 1 pack of cigarettes a day for 1 year (for example, a 30-pack-year history of smoking could mean smoking 1 pack a day for 30 years or 2 packs a day for 15 years). Yearly screening should continue until the smoker has stopped smoking for at least 15 years. Yearly screening should be stopped for people who develop a health problem that would prevent them from having lung cancer treatment.  If you choose to drink alcohol, do not have more than 2 drinks per day. One drink is considered to be 12 oz (360 mL) of beer, 5 oz (150 mL) of wine, or 1.5 oz (45 mL) of liquor.  Avoid the use of street drugs. Do not share needles with anyone. Ask for help if you need support or instructions about stopping the use of drugs.  High blood pressure causes heart disease and increases the risk of stroke. High blood pressure is more likely to develop in:  People who have blood pressure in the end of the normal range (100-139/85-89 mm Hg).  People who are overweight or obese.  People who are African American.  If you are 70-94 years of age, have your blood pressure checked every 3-5 years. If you are 24 years of age or older, have your blood pressure checked every year. You should have your blood pressure measured twice--once when you are at  a hospital or clinic, and once when you are not at a hospital or clinic. Record the average of the two measurements. To check your blood pressure when you are not at a hospital or clinic, you can use:  An automated blood pressure machine at a pharmacy.  A home blood pressure monitor.  If you are 52-32 years old, ask your health  care provider if you should take aspirin to prevent heart disease.  Diabetes screening involves taking a blood sample to check your fasting blood sugar level. This should be done once every 3 years after age 3 if you are at a normal weight and without risk factors for diabetes. Testing should be considered at a younger age or be carried out more frequently if you are overweight and have at least 1 risk factor for diabetes.  Colorectal cancer can be detected and often prevented. Most routine colorectal cancer screening begins at the age of 64 and continues through age 65. However, your health care provider may recommend screening at an earlier age if you have risk factors for colon cancer. On a yearly basis, your health care provider may provide home test kits to check for hidden blood in the stool. A small camera at the end of a tube may be used to directly examine the colon (sigmoidoscopy or colonoscopy) to detect the earliest forms of colorectal cancer. Talk to your health care provider about this at age 17 when routine screening begins. A direct exam of the colon should be repeated every 5-10 years through age 36, unless early forms of precancerous polyps or small growths are found.  People who are at an increased risk for hepatitis B should be screened for this virus. You are considered at high risk for hepatitis B if:  You were born in a country where hepatitis B occurs often. Talk with your health care provider about which countries are considered high risk.  Your parents were born in a high-risk country and you have not received a shot to protect against hepatitis B (hepatitis B vaccine).  You have HIV or AIDS.  You use needles to inject street drugs.  You live with, or have sex with, someone who has hepatitis B.  You are a man who has sex with other men (MSM).  You get hemodialysis treatment.  You take certain medicines for conditions like cancer, organ transplantation, and autoimmune  conditions.  Hepatitis C blood testing is recommended for all people born from [birth year] through 1965 and any individual with known risk factors for hepatitis C.  Healthy men should no longer receive prostate-specific antigen (PSA) blood tests as part of routine cancer screening. Talk to your health care provider about prostate cancer screening.  Testicular cancer screening is not recommended for adolescents or adult males who have no symptoms. Screening includes self-exam, a health care provider exam, and other screening tests. Consult with your health care provider about any symptoms you have or any concerns you have about testicular cancer.  Practice safe sex. Use condoms and avoid high-risk sexual practices to reduce the spread of sexually transmitted infections (STIs).  You should be screened for STIs, including gonorrhea and chlamydia if:  You are sexually active and are younger than 24 years.  You are older than 24 years, and your health care provider tells you that you are at risk for this type of infection.  Your sexual activity has changed since you were last screened, and you are at an increased risk for chlamydia or  gonorrhea. Ask your health care provider if you are at risk.  If you are at risk of being infected with HIV, it is recommended that you take a prescription medicine daily to prevent HIV infection. This is called pre-exposure prophylaxis (PrEP). You are considered at risk if:  You are a man who has sex with other men (MSM).  You are a heterosexual man who is sexually active with multiple partners.  You take drugs by injection.  You are sexually active with a partner who has HIV.  Talk with your health care provider about whether you are at high risk of being infected with HIV. If you choose to begin PrEP, you should first be tested for HIV. You should then be tested every 3 months for as long as you are taking PrEP.  Use sunscreen. Apply sunscreen liberally and  repeatedly throughout the day. You should seek shade when your shadow is shorter than you. Protect yourself by wearing long sleeves, pants, a wide-brimmed hat, and sunglasses year round whenever you are outdoors.  Tell your health care provider of new moles or changes in moles, especially if there is a change in shape or color. Also, tell your health care provider if a mole is larger than the size of a pencil eraser.  A one-time screening for abdominal aortic aneurysm (AAA) and surgical repair of large AAAs by ultrasound is recommended for men aged 79-75 years who are current or former smokers.  Stay current with your vaccines (immunizations).   This information is not intended to replace advice given to you by your health care provider. Make sure you discuss any questions you have with your health care provider.   Document Released: 08/01/2007 Document Revised: 02/23/2014 Document Reviewed: 06/30/2010 Elsevier Interactive Patient Education [employer].

## 2015-11-25 ENCOUNTER — Other Ambulatory Visit: Payer: Self-pay | Admitting: Internal Medicine

## 2015-11-28 ENCOUNTER — Encounter: Payer: Self-pay | Admitting: Internal Medicine

## 2015-11-28 ENCOUNTER — Ambulatory Visit (INDEPENDENT_AMBULATORY_CARE_PROVIDER_SITE_OTHER): Payer: Commercial Managed Care - HMO | Admitting: Internal Medicine

## 2015-11-28 ENCOUNTER — Other Ambulatory Visit (INDEPENDENT_AMBULATORY_CARE_PROVIDER_SITE_OTHER): Payer: Commercial Managed Care - HMO

## 2015-11-28 VITALS — BP 110/64 | HR 71 | Temp 98.0°F | Resp 16 | Ht 72.0 in | Wt 184.8 lb

## 2015-11-28 DIAGNOSIS — R1310 Dysphagia, unspecified: Secondary | ICD-10-CM

## 2015-11-28 DIAGNOSIS — E785 Hyperlipidemia, unspecified: Secondary | ICD-10-CM | POA: Diagnosis not present

## 2015-11-28 DIAGNOSIS — E118 Type 2 diabetes mellitus with unspecified complications: Secondary | ICD-10-CM | POA: Diagnosis not present

## 2015-11-28 DIAGNOSIS — Z0000 Encounter for general adult medical examination without abnormal findings: Secondary | ICD-10-CM

## 2015-11-28 LAB — LIPID PANEL
Cholesterol: 111 mg/dL (ref 0–200)
HDL: 43.5 mg/dL (ref >39.00)
LDL Cholesterol: 54 mg/dL (ref 0–99)
NONHDL: 67
Total CHOL/HDL Ratio: 3
Triglycerides: 63 mg/dL (ref 0.0–149.0)
VLDL: 12.6 mg/dL (ref 0.0–40.0)

## 2015-11-28 LAB — MICROALBUMIN / CREATININE URINE RATIO
Creatinine,U: 58 mg/dL
Microalb Creat Ratio: 1.2 mg/g (ref 0.0–30.0)

## 2015-11-28 LAB — COMPREHENSIVE METABOLIC PANEL
ALK PHOS: 61 U/L (ref 39–117)
ALT: 39 U/L (ref 0–53)
AST: 26 U/L (ref 0–37)
Albumin: 4.2 g/dL (ref 3.5–5.2)
BILIRUBIN TOTAL: 0.5 mg/dL (ref 0.2–1.2)
BUN: 16 mg/dL (ref 6–23)
CO2: 29 meq/L (ref 19–32)
Calcium: 9.8 mg/dL (ref 8.4–10.5)
Chloride: 103 mEq/L (ref 96–112)
Creatinine, Ser: 0.89 mg/dL (ref 0.40–1.50)
GFR: 89.16 mL/min (ref >60.00)
GLUCOSE: 133 mg/dL — AB (ref 70–99)
Potassium: 4.8 mEq/L (ref 3.5–5.1)
SODIUM: 137 meq/L (ref 135–145)
TOTAL PROTEIN: 6.9 g/dL (ref 6.0–8.3)

## 2015-11-28 LAB — HEMOGLOBIN A1C: Hgb A1c MFr Bld: 7 % — ABNORMAL HIGH (ref 4.6–6.5)

## 2015-11-28 LAB — CBC WITH DIFFERENTIAL/PLATELET
BASOS ABS: 0.1 10*3/uL (ref 0.0–0.1)
Basophils Relative: 0.6 % (ref 0.0–3.0)
EOS PCT: 5.5 % — AB (ref 0.0–5.0)
Eosinophils Absolute: 0.4 10*3/uL (ref 0.0–0.7)
HCT: 44.3 % (ref 39.0–52.0)
Hemoglobin: 14.7 g/dL (ref 13.0–17.0)
LYMPHS ABS: 2.4 10*3/uL (ref 0.7–4.0)
Lymphocytes Relative: 30.7 % (ref 12.0–46.0)
MCHC: 33.1 g/dL (ref 30.0–36.0)
MCV: 92.1 fl (ref 78.0–100.0)
MONO ABS: 0.9 10*3/uL (ref 0.1–1.0)
Monocytes Relative: 10.9 % (ref 3.0–12.0)
NEUTROS ABS: 4.1 10*3/uL (ref 1.4–7.7)
NEUTROS PCT: 52.3 % (ref 43.0–77.0)
PLATELETS: 160 10*3/uL (ref 150.0–400.0)
RBC: 4.81 Mil/uL (ref 4.22–5.81)
RDW: 13.4 % (ref 11.5–15.5)
WBC: 7.9 10*3/uL (ref 4.0–10.5)

## 2015-11-28 LAB — URINALYSIS, ROUTINE W REFLEX MICROSCOPIC
BILIRUBIN URINE: NEGATIVE
HGB URINE DIPSTICK: NEGATIVE
KETONES UR: NEGATIVE
Leukocytes, UA: NEGATIVE
NITRITE: NEGATIVE
RBC / HPF: NONE SEEN (ref 0–?)
Specific Gravity, Urine: 1.01 (ref 1.000–1.030)
Total Protein, Urine: NEGATIVE
URINE GLUCOSE: 100 — AB
UROBILINOGEN UA: 0.2 (ref 0.0–1.0)
pH: 6 (ref 5.0–8.0)

## 2015-11-28 LAB — TSH: TSH: 1.99 u[IU]/mL (ref 0.35–4.50)

## 2015-11-28 MED ORDER — SIMVASTATIN 40 MG PO TABS: 40.0000 mg | ORAL_TABLET | Freq: Every day | ORAL | 3 refills | Status: DC

## 2015-11-28 MED ORDER — ZOSTER VACCINE LIVE 19400 UNT/0.65ML ~~LOC~~ SUSR: 0.6500 mL | Freq: Once | SUBCUTANEOUS | 0 refills | Status: AC

## 2015-11-28 NOTE — Patient Instructions (Signed)

## 2015-11-28 NOTE — Progress Notes (Signed)
Pre visit review using our clinic review tool, if applicable. No additional management support is needed unless otherwise documented below in the visit note. 

## 2015-11-28 NOTE — Progress Notes (Signed)
Subjective:  Patient ID: Angel Rodgers, male    DOB: 1945-12-21  Age: 70 y.o. MRN: [id number]  CC: Hypertension; Hyperlipidemia; Diabetes; Annual Exam; and Gastroesophageal Reflux   HPI Ren Aspinall presents for a CPX/AWV.  He complains of worsening heartburn and says that he is having trouble swallowing pieces of meat and pills. He frequently takes Zantac and Tums and gets moderate symptom relief.  He also tells me his blood sugars have been well controlled on metformin. He denies visual disturbance, polyuria, polydipsia, or polyphagia.  Outpatient Medications Prior to Visit  Medication Sig Dispense Refill  . ACCU-CHEK FASTCLIX LANCETS MISC Use BID dx E11.8 102 each 3  . Alcohol Swabs (B-D SINGLE USE SWABS REGULAR) PADS Use as directed top check blood sugars dx E11.8 100 each 3  . aspirin EC 81 MG tablet Take 1 tablet (81 mg total) by mouth daily. 90 tablet 3  . Blood Glucose Calibration (ACCU-CHEK SMARTVIEW CONTROL) LIQD Use BID dx e11.8 3 each 3  . Blood Glucose Monitoring Suppl (ACCU-CHEK NANO SMARTVIEW) W/DEVICE KIT TEST BLOOD GLUCOSE TWICE DAILY  (FOR DIABETES, E11.8) 1 kit 2  . glucose blood (ACCU-CHEK SMARTVIEW) test strip Use twice daily to check blood sugar Dx E11.8 200 each 3  . metFORMIN (GLUCOPHAGE) 1000 MG tablet TAKE 1 TABLET TWICE DAILY WITH MEALS 180 tablet 0  . atorvastatin (LIPITOR) 20 MG tablet Take 1 tablet (20 mg total) by mouth daily. 90 tablet 3   No facility-administered medications prior to visit.     ROS Review of Systems  Constitutional: Negative.  Negative for appetite change, diaphoresis, fatigue and unexpected weight change.  HENT: Positive for trouble swallowing. Negative for sore throat and voice change.   Eyes: Negative.  Negative for photophobia and visual disturbance.  Respiratory: Negative.  Negative for cough, choking, chest tightness, shortness of breath and stridor.   Cardiovascular: Negative.  Negative for chest pain, palpitations and leg  swelling.  Gastrointestinal: Negative for abdominal pain, blood in stool, constipation, diarrhea, nausea and vomiting.  Endocrine: Negative.  Negative for cold intolerance, polydipsia, polyphagia and polyuria.  Genitourinary: Negative.  Negative for difficulty urinating, discharge, dysuria, genital sores, hematuria, penile pain, penile swelling, scrotal swelling and urgency.  Musculoskeletal: Negative.  Negative for arthralgias, back pain, myalgias and neck pain.  Skin: Negative.   Allergic/Immunologic: Negative.   Neurological: Negative.  Negative for dizziness, tremors, weakness, light-headedness, numbness and headaches.  Hematological: Negative.  Negative for adenopathy. Does not bruise/bleed easily.  Psychiatric/Behavioral: Negative.     Objective:  BP 110/64 (BP Location: Left Arm, Patient Position: Sitting, Cuff Size: Normal)   Pulse 71   Temp 98 F (36.7 C) (Oral)   Resp 16   Ht 6' (1.829 m)   Wt 184 lb 12 oz (83.8 kg)   SpO2 97%   BMI 25.06 kg/m   BP Readings from Last 3 Encounters:  11/28/15 110/64  11/05/15 110/60  10/31/14 116/64    Wt Readings from Last 3 Encounters:  11/28/15 184 lb 12 oz (83.8 kg)  11/05/15 184 lb 8 oz (83.7 kg)  10/31/14 184 lb (83.5 kg)    Physical Exam  Constitutional: He is oriented to person, place, and time. No distress.  HENT:  Head: Normocephalic and atraumatic.  Mouth/Throat: Oropharynx is clear and moist. No oropharyngeal exudate.  Eyes: Conjunctivae are normal. Right eye exhibits no discharge. Left eye exhibits no discharge. No scleral icterus.  Neck: Normal range of motion. Neck supple. No JVD present. No tracheal deviation  present. No thyromegaly present.  Cardiovascular: Normal rate, regular rhythm, normal heart sounds and intact distal pulses.  Exam reveals no gallop.   No murmur heard. Pulmonary/Chest: Effort normal and breath sounds normal. No stridor. No respiratory distress. He has no wheezes. He has no rales. He exhibits  no tenderness.  Abdominal: Soft. Bowel sounds are normal. He exhibits no distension and no mass. There is no tenderness. There is no rebound and no guarding.  Musculoskeletal: Normal range of motion. He exhibits no edema or tenderness.  Lymphadenopathy:    He has no cervical adenopathy.  Neurological: He is oriented to person, place, and time.  Skin: Skin is warm and dry. No rash noted. He is not diaphoretic. No erythema. No pallor.  Psychiatric: He has a normal mood and affect. His behavior is normal. Judgment and thought content normal.  Vitals reviewed.   Lab Results  Component Value Date   WBC 7.9 11/28/2015   HGB 14.7 11/28/2015   HCT 44.3 11/28/2015   PLT 160.0 11/28/2015   GLUCOSE 133 (H) 11/28/2015   CHOL 111 11/28/2015   TRIG 63.0 11/28/2015   HDL 43.50 11/28/2015   LDLDIRECT 169.8 02/21/2010   LDLCALC 54 11/28/2015   ALT 39 11/28/2015   AST 26 11/28/2015   NA 137 11/28/2015   K 4.8 11/28/2015   CL 103 11/28/2015   CREATININE 0.89 11/28/2015   BUN 16 11/28/2015   CO2 29 11/28/2015   TSH 1.99 11/28/2015   PSA 0.85 10/31/2014   HGBA1C 7.0 (H) 11/28/2015   MICROALBUR <0.7 11/28/2015    No results found.  Assessment & Plan:   Brenen was seen today for hypertension, hyperlipidemia, diabetes, annual exam and gastroesophageal reflux.  Diagnoses and all orders for this visit:  Type 2 diabetes mellitus with complication, without long-term current use of insulin ([place])- his A1c is 7.0%, his blood sugars are well-controlled and he is tolerating metformin well with normal renal function, we'll continue. -     Comprehensive metabolic panel; Future -     Hemoglobin A1c; Future -     Urinalysis, Routine w reflex microscopic (not at [facility]); Future -     Microalbumin / creatinine urine ratio; Future  Hyperlipidemia with target LDL less than 70- he has achieved his LDL goal with just atorvastatin but he thinks simvastatin will be less expensive so changed his prescription. -      Lipid panel; Future -     Comprehensive metabolic panel; Future -     CBC with Differential/Platelet; Future -     TSH; Future -     simvastatin (ZOCOR) 40 MG tablet; Take 1 tablet (40 mg total) by mouth at bedtime.  Dysphagia, unspecified type- I'm concerned he may have upper GI pathology such as strictures, rings, Barrett's esophagus or possibly esophageal cancer so I've asked him to see GI to consider upper endoscopy. -     Ambulatory referral to Gastroenterology  Other orders -     Zoster Vaccine Live, PF, (ZOSTAVAX) 57262 UNT/0.65ML injection; Inject 19,400 Units into the skin once.   I have discontinued Mr. Berrian's atorvastatin. I am also having him start on simvastatin and Zoster Vaccine Live (PF). Additionally, I am having him maintain his aspirin EC, ACCU-CHEK FASTCLIX LANCETS, B-D SINGLE USE SWABS REGULAR, ACCU-CHEK SMARTVIEW CONTROL, ACCU-CHEK NANO SMARTVIEW, glucose blood, and metFORMIN.  Meds ordered this encounter  Medications  . simvastatin (ZOCOR) 40 MG tablet    Sig: Take 1 tablet (40 mg total) by mouth  at bedtime.    Dispense:  90 tablet    Refill:  3  . Zoster Vaccine Live, PF, (ZOSTAVAX) 01100 UNT/0.65ML injection    Sig: Inject 19,400 Units into the skin once.    Dispense:  1 each    Refill:  0   See AVS for instructions about healthy living and anticipatory guidance.  Follow-up: Return in about 6 months (around 05/28/2016).  Scarlette Calico, MD

## 2015-11-29 ENCOUNTER — Encounter: Payer: Self-pay | Admitting: Internal Medicine

## 2015-11-30 NOTE — Assessment & Plan Note (Signed)

## 2015-12-09 ENCOUNTER — Encounter: Payer: Self-pay | Admitting: Internal Medicine

## 2015-12-11 DIAGNOSIS — H35373 Puckering of macula, bilateral: Secondary | ICD-10-CM | POA: Diagnosis not present

## 2015-12-11 DIAGNOSIS — H35033 Hypertensive retinopathy, bilateral: Secondary | ICD-10-CM | POA: Diagnosis not present

## 2015-12-11 DIAGNOSIS — H35363 Drusen (degenerative) of macula, bilateral: Secondary | ICD-10-CM | POA: Diagnosis not present

## 2015-12-11 DIAGNOSIS — H2513 Age-related nuclear cataract, bilateral: Secondary | ICD-10-CM | POA: Diagnosis not present

## 2015-12-11 DIAGNOSIS — H18002 Unspecified corneal deposit, left eye: Secondary | ICD-10-CM | POA: Diagnosis not present

## 2015-12-11 LAB — HM DIABETES EYE EXAM

## 2015-12-13 ENCOUNTER — Encounter: Payer: Self-pay | Admitting: Internal Medicine

## 2015-12-13 NOTE — Progress Notes (Signed)
Documentation provider for diabetic eye exam.

## 2016-01-06 ENCOUNTER — Other Ambulatory Visit: Payer: Self-pay | Admitting: Internal Medicine

## 2016-01-20 DIAGNOSIS — J069 Acute upper respiratory infection, unspecified: Secondary | ICD-10-CM | POA: Diagnosis not present

## 2016-01-24 ENCOUNTER — Encounter: Payer: Self-pay | Admitting: *Deleted

## 2016-03-02 ENCOUNTER — Encounter: Payer: Self-pay | Admitting: Internal Medicine

## 2016-03-02 ENCOUNTER — Ambulatory Visit (INDEPENDENT_AMBULATORY_CARE_PROVIDER_SITE_OTHER): Payer: Medicare HMO | Admitting: Internal Medicine

## 2016-03-02 VITALS — BP 124/64 | HR 96 | Ht 71.0 in | Wt 189.1 lb

## 2016-03-02 DIAGNOSIS — R1319 Other dysphagia: Secondary | ICD-10-CM

## 2016-03-02 DIAGNOSIS — Z86010 Personal history of colonic polyps: Secondary | ICD-10-CM | POA: Diagnosis not present

## 2016-03-02 DIAGNOSIS — R1310 Dysphagia, unspecified: Secondary | ICD-10-CM | POA: Diagnosis not present

## 2016-03-02 DIAGNOSIS — K219 Gastro-esophageal reflux disease without esophagitis: Secondary | ICD-10-CM | POA: Diagnosis not present

## 2016-03-02 DIAGNOSIS — R1013 Epigastric pain: Secondary | ICD-10-CM

## 2016-03-02 MED ORDER — PANTOPRAZOLE SODIUM 40 MG PO TBEC: 40.0000 mg | DELAYED_RELEASE_TABLET | Freq: Every day | ORAL | 2 refills | Status: DC

## 2016-03-02 NOTE — Progress Notes (Signed)
Patient ID: Angel Rodgers, male   DOB: 1945/05/27, 71 y.o.   MRN: [id number] HPI: Angel Rodgers is a 71 year old male with a past medical history of adenomatous colon polyps, diverticulosis, GERD, diabetes and hyperlipidemia who seen in consultation at the request of Dr. Ronnald Ramp to evaluate dysphagia and epigastric discomfort. He is here alone today.  He reports that over the years he's had intermittent issues with heartburn and regurgitation. Recently he endorses and epigastric abdominal pain which is located just below the xiphoid. This is often worse at night and often gets better when he lies on his stomach for about 20 minutes. He feels like it may be "gas". He does endorse regurgitation and occasional vomiting. He does have heartburn on a regular basis which he has had for years. He recalls being diagnosed with reflux and treated when he was about 71 years old. He does not take reflux medication now but uses Tums on a near daily basis and occasionally over-the-counter ranitidine. He endorses some solid food dysphagia which seems to come and go. Worse with foods like meat. Does not seem to be progressive. He occasionally has to force food down with fluids. No odynophagia. Denies weight loss.  He has a history of adenomatous colon polyps and last colonoscopy was in 2016. I have pathology results which date 1 adenoma removed from the right colon. He recalls a 5 year surveillance recommendation.  He does have a family history of colon cancer in a maternal uncle and his dad had rectal cancer.  His stools previously were loose attribute it to metformin that this has improved over the years. He denies constipation. Denies rectal bleeding and melena  Past Medical History:  Diagnosis Date  . Diverticulosis   . DM (diabetes mellitus) ([place])   . Endocarditis, valve unspecified, unspecified cause   . GERD (gastroesophageal reflux disease)   . History of rheumatic fever   . HLD (hyperlipidemia)   . Syncope and  collapse   . Tubular adenoma of colon     Past Surgical History:  Procedure Laterality Date  . NO PAST SURGERIES      Outpatient Medications Prior to Visit  Medication Sig Dispense Refill  . ACCU-CHEK FASTCLIX LANCETS MISC Use BID dx E11.8 102 each 3  . Alcohol Swabs (B-D SINGLE USE SWABS REGULAR) PADS Use as directed top check blood sugars dx E11.8 100 each 3  . aspirin EC 81 MG tablet Take 1 tablet (81 mg total) by mouth daily. 90 tablet 3  . Blood Glucose Calibration (ACCU-CHEK SMARTVIEW CONTROL) LIQD Use BID dx e11.8 3 each 3  . Blood Glucose Monitoring Suppl (ACCU-CHEK NANO SMARTVIEW) W/DEVICE KIT TEST BLOOD GLUCOSE TWICE DAILY  (FOR DIABETES, E11.8) 1 kit 2  . glucose blood (ACCU-CHEK SMARTVIEW) test strip Use twice daily to check blood sugar Dx E11.8 200 each 3  . metFORMIN (GLUCOPHAGE) 1000 MG tablet TAKE 1 TABLET TWICE DAILY WITH MEALS 180 tablet 1  . simvastatin (ZOCOR) 40 MG tablet Take 1 tablet (40 mg total) by mouth at bedtime. 90 tablet 3   No facility-administered medications prior to visit.     No Known Allergies  Family History  Problem Relation Age of Onset  . Coronary artery disease Mother     AICD/PACER  . Hypertension Mother   . Diabetes Mother   . Hyperlipidemia Mother   . Coronary artery disease Father     AICD Pacer  . Cancer Father     ANAL  . Colon cancer Maternal  Uncle   . Stomach cancer Other     materal great uncle and great aunt  . Prostate cancer Neg Hx     Social History  Substance Use Topics  . Smoking status: Former Smoker    Packs/day: 0.50    Years: 20.00    Types: Cigarettes, Pipe    Quit date: 02/17/1983  . Smokeless tobacco: Never Used     Comment: Smoked until 83/ smoked pipe x 5 years  . Alcohol use 0.0 oz/week     Comment: occasional beer every week or 2    ROS: As per history of present illness, otherwise negative  BP 124/64 (BP Location: Left Arm, Patient Position: Sitting, Cuff Size: Normal)   Pulse 96   Ht _0   (1.803 m) Comment: height measured without shoes  Wt 189 lb 2 oz (85.8 kg)   BMI 26.38 kg/m  Constitutional: Well-developed and well-nourished. No distress. HEENT: Normocephalic and atraumatic. Oropharynx is clear and moist. No oropharyngeal exudate. Conjunctivae are normal.  No scleral icterus. Neck: Neck supple. Trachea midline. Cardiovascular: Normal rate, regular rhythm and intact distal pulses. No M/R/G Pulmonary/chest: Effort normal and breath sounds normal. No wheezing, rales or rhonchi. Abdominal: Soft, nontender, nondistended. Bowel sounds active throughout. There are no masses palpable. No hepatosplenomegaly. Extremities: no clubbing, cyanosis, or edema Lymphadenopathy: No cervical adenopathy noted. Neurological: Alert and oriented to person place and time. Skin: Skin is warm and dry. No rashes noted. Psychiatric: Normal mood and affect. Behavior is normal.  RELEVANT LABS AND IMAGING: CBC    Component Value Date/Time   WBC 7.9 11/28/2015 1410   RBC 4.81 11/28/2015 1410   HGB 14.7 11/28/2015 1410   HCT 44.3 11/28/2015 1410   PLT 160.0 11/28/2015 1410   MCV 92.1 11/28/2015 1410   MCHC 33.1 11/28/2015 1410   RDW 13.4 11/28/2015 1410   LYMPHSABS 2.4 11/28/2015 1410   MONOABS 0.9 11/28/2015 1410   EOSABS 0.4 11/28/2015 1410   BASOSABS 0.1 11/28/2015 1410    CMP     Component Value Date/Time   NA 137 11/28/2015 1410   K 4.8 11/28/2015 1410   CL 103 11/28/2015 1410   CO2 29 11/28/2015 1410   GLUCOSE 133 (H) 11/28/2015 1410   BUN 16 11/28/2015 1410   CREATININE 0.89 11/28/2015 1410   CALCIUM 9.8 11/28/2015 1410   PROT 6.9 11/28/2015 1410   ALBUMIN 4.2 11/28/2015 1410   AST 26 11/28/2015 1410   ALT 39 11/28/2015 1410   ALKPHOS 61 11/28/2015 1410   BILITOT 0.5 11/28/2015 1410    ASSESSMENT/PLAN: 71 year old male with a past medical history of adenomatous colon polyps, diverticulosis, GERD, diabetes and hyperlipidemia who seen in consultation at the request of Dr.  Ronnald Ramp to evaluate dysphagia and epigastric discomfort.   1. GERD with dysphagia and epigastric pain -- symptoms sound reflux related and need to exclude lower esophageal stricture and less likely mass lesion. I recommended pantoprazole 40 mg daily, 30before breakfast to control heartburn and reflux symptoms. His epigastric pain likely relates to reflux. I've also recommended endoscopy with possible dilation. We discussed the risks, benefits and alternatives and he wishes to proceed.  2. History of colon polyps -- prior colonoscopy records to be requested. By pathology results only it appears he had a single adenoma removed from the right colon in 2016 and if this is confirmed by records than his recall would be recommended in May 2021   Cc:Janith Lima, Md [address] [facility] [address]  [place], [place] [address]

## 2016-03-02 NOTE — Patient Instructions (Signed)
You have been scheduled for an endoscopy. Please follow written instructions given to you at your visit today. If you use inhalers (even only as needed), please bring them with you on the day of your procedure. Your physician has requested that you go to www.startemmi.com and enter the access code given to you at your visit today. This web site gives a general overview about your procedure. However, you should still follow specific instructions given to you by our office regarding your preparation for the procedure.  We have sent the following medications to your pharmacy for you to pick up at your convenience: Pantoprazole 40 mg daily  If you are age 75 or older, your body mass index should be between 23-30. Your Body mass index is 26.38 kg/m. If this is out of the aforementioned range listed, please consider follow up with your Primary Care Provider.  If you are age 65 or younger, your body mass index should be between 19-25. Your Body mass index is 26.38 kg/m. If this is out of the aformentioned range listed, please consider follow up with your Primary Care Provider.

## 2016-03-18 ENCOUNTER — Encounter: Payer: Self-pay | Admitting: Internal Medicine

## 2016-03-31 ENCOUNTER — Encounter: Payer: Self-pay | Admitting: Internal Medicine

## 2016-03-31 ENCOUNTER — Ambulatory Visit (AMBULATORY_SURGERY_CENTER): Payer: Medicare HMO | Admitting: Internal Medicine

## 2016-03-31 VITALS — BP 112/65 | HR 59 | Temp 96.4°F | Resp 16 | Ht 71.0 in | Wt 189.0 lb

## 2016-03-31 DIAGNOSIS — K2950 Unspecified chronic gastritis without bleeding: Secondary | ICD-10-CM | POA: Diagnosis not present

## 2016-03-31 DIAGNOSIS — K2990 Gastroduodenitis, unspecified, without bleeding: Secondary | ICD-10-CM

## 2016-03-31 DIAGNOSIS — K208 Other esophagitis: Secondary | ICD-10-CM | POA: Diagnosis not present

## 2016-03-31 DIAGNOSIS — R1013 Epigastric pain: Secondary | ICD-10-CM | POA: Diagnosis not present

## 2016-03-31 DIAGNOSIS — R1310 Dysphagia, unspecified: Secondary | ICD-10-CM | POA: Diagnosis not present

## 2016-03-31 DIAGNOSIS — K2970 Gastritis, unspecified, without bleeding: Secondary | ICD-10-CM | POA: Diagnosis not present

## 2016-03-31 DIAGNOSIS — K219 Gastro-esophageal reflux disease without esophagitis: Secondary | ICD-10-CM | POA: Diagnosis present

## 2016-03-31 DIAGNOSIS — E669 Obesity, unspecified: Secondary | ICD-10-CM | POA: Diagnosis not present

## 2016-03-31 DIAGNOSIS — E119 Type 2 diabetes mellitus without complications: Secondary | ICD-10-CM | POA: Diagnosis not present

## 2016-03-31 MED ORDER — SODIUM CHLORIDE 0.9 % IV SOLN: 500.0000 mL | INTRAVENOUS | Status: DC

## 2016-03-31 NOTE — Progress Notes (Signed)
Called to room to assist during endoscopic procedure.  Patient ID and intended procedure confirmed with present staff. Received instructions for my participation in the procedure from the performing physician.  

## 2016-03-31 NOTE — Op Note (Signed)
[place] Patient Name: Angel Rodgers Procedure Date: 03/31/2016 1:36 PM MRN: [id number] Endoscopist: Jerene Bears , MD Age: 71 Referring MD:  Date of Birth: Jun 04, 1945 Gender: Male Account #: [id number] Procedure:                Upper GI endoscopy Indications:              Epigastric abdominal pain, Dysphagia,                            Gastro-esophageal reflux disease Medicines:                Monitored Anesthesia Care Procedure:                Pre-Anesthesia Assessment:                           - Prior to the procedure, a History and Physical                            was performed, and patient medications and                            allergies were reviewed. The patient's tolerance of                            previous anesthesia was also reviewed. The risks                            and benefits of the procedure and the sedation                            options and risks were discussed with the patient.                            All questions were answered, and informed consent                            was obtained. Prior Anticoagulants: The patient has                            taken no previous anticoagulant or antiplatelet                            agents. ASA Grade Assessment: III - A patient with                            severe systemic disease. After reviewing the risks                            and benefits, the patient was deemed in                            satisfactory condition to undergo the procedure.  After obtaining informed consent, the endoscope was                            passed under direct vision. Throughout the                            procedure, the patient's blood pressure, pulse, and                            oxygen saturations were monitored continuously. The                            Model GIF-[id number] [phone]) scope was introduced                            through the mouth, and advanced to  the second part                            of duodenum. The upper GI endoscopy was                            accomplished without difficulty. The patient                            tolerated the procedure well. Scope In: Scope Out: Findings:                 In the proximal esophagus (18 cm from incisors)                            there was a small inlet patch without nodularity or                            visible abnormality.                           The Z-line was irregular and was found 39 to 40 cm                            from the incisors (1 cm tongue of possible                            Barrett's). Two biopsies were obtained with cold                            forceps for evaluation to rule out Barrett's                            Esophagus in a targeted manner at 39 cm from the                            incisors.                           There  is no endoscopic evidence of stricture in the                            entire esophagus.                           Diffuse mild inflammation characterized by erythema                            was found in the cardia and in the gastric fundus.                           A deformity was found in the prepyloric region of                            the stomach and at the pylorus likely related to                            prior peptic ulcer disease. The pylorus is                            patulous. Multiple biopsies were obtained with cold                            forceps for histology in a targeted manner at the                            incisura, in the prepyloric region of the stomach                            and at the pylorus.                           The examined duodenum was normal. Complications:            No immediate complications. Estimated Blood Loss:     Estimated blood loss was minimal. Impression:               - Z-line irregular, 39 to 40 cm from the incisors.                            Biopsied.                            - Gastritis.                           - Acquired deformity in the prepyloric region of                            the stomach and in the pylorus. Biopsied.                           - Normal examined duodenum. Recommendation:           - Patient has a  contact number available for                            emergencies. The signs and symptoms of potential                            delayed complications were discussed with the                            patient. Return to normal activities tomorrow.                            Written discharge instructions were provided to the                            patient.                           - Resume previous diet.                           - Continue present medications including                            pantoprazole 40 mg daily.                           - Await pathology results. Jerene Bears, MD 03/31/2016 1:57:38 PM This report has been signed electronically.

## 2016-03-31 NOTE — Patient Instructions (Addendum)
YOU HAD AN ENDOSCOPIC PROCEDURE TODAY AT [facility]:   Refer to the procedure report that was given to you for any specific questions about what was found during the examination.  If the procedure report does not answer your questions, please call your gastroenterologist to clarify.  If you requested that your care partner not be given the details of your procedure findings, then the procedure report has been included in a sealed envelope for you to review at your convenience later.  YOU SHOULD EXPECT: Some feelings of bloating in the abdomen. Passage of more gas than usual.  Walking can help get rid of the air that was put into your GI tract during the procedure and reduce the bloating. If you had a lower endoscopy (such as a colonoscopy or flexible sigmoidoscopy) you may notice spotting of blood in your stool or on the toilet paper. If you underwent a bowel prep for your procedure, you may not have a normal bowel movement for a few days.  Please Note:  You might notice some irritation and congestion in your nose or some drainage.  This is from the oxygen used during your procedure.  There is no need for concern and it should clear up in a day or so.  SYMPTOMS TO REPORT IMMEDIATELY:     Following upper endoscopy (EGD)  Vomiting of blood or coffee ground material  New chest pain or pain under the shoulder blades  Painful or persistently difficult swallowing  New shortness of breath  Fever of 100F or higher  Black, tarry-looking stools  For urgent or emergent issues, a gastroenterologist can be reached at any hour by calling [phone].   DIET:  We do recommend a small meal at first, but then you may proceed to your regular diet.  Drink plenty of fluids but you should avoid alcoholic beverages for 24 hours.  ACTIVITY:  You should plan to take it easy for the rest of today and you should NOT DRIVE or use heavy machinery until tomorrow (because of the sedation medicines  used during the test).    FOLLOW UP: Our staff will call the number listed on your records the next business day following your procedure to check on you and address any questions or concerns that you may have regarding the information given to you following your procedure. If we do not reach you, we will leave a message.  However, if you are feeling well and you are not experiencing any problems, there is no need to return our call.  We will assume that you have returned to your regular daily activities without incident.  If any biopsies were taken you will be contacted by phone or by letter within the next 1-3 weeks.  Please call us at [phone] if you have not heard about the biopsies in 3 weeks.    SIGNATURES/CONFIDENTIALITY: You and/or your care partner have signed paperwork which will be entered into your electronic medical record.  These signatures attest to the fact that that the information above on your After Visit Summary has been reviewed and is understood.  Full responsibility of the confidentiality of this discharge information lies with you and/or your care-partner.    Information on Reflux given to you today  Information on Gastritis given to you today  Continue medications including Protonix 40 mg daily  Await biopsy results

## 2016-03-31 NOTE — Progress Notes (Signed)
Report to PACU, RN, vss, BBS= Clear.  

## 2016-04-01 ENCOUNTER — Telehealth: Payer: Self-pay | Admitting: *Deleted

## 2016-04-01 NOTE — Telephone Encounter (Signed)
  Follow up Call-  Call back number 03/31/2016  Post procedure Call Back phone  # [phone]  Permission to leave phone message Yes  Some recent data might be hidden     Patient questions:  Do you have a fever, pain , or abdominal swelling? No. Pain Score  0 *  Have you tolerated food without any problems? Yes.    Have you been able to return to your normal activities? Yes.    Do you have any questions about your discharge instructions: Diet   No. Medications  No. Follow up visit  No.  Do you have questions or concerns about your Care? No.  Actions: * If pain score is 4 or above: No action needed, pain <4.

## 2016-04-07 ENCOUNTER — Encounter: Payer: Self-pay | Admitting: Internal Medicine

## 2016-04-14 ENCOUNTER — Telehealth: Payer: Self-pay | Admitting: Internal Medicine

## 2016-04-14 MED ORDER — PANTOPRAZOLE SODIUM 40 MG PO TBEC: 40.0000 mg | DELAYED_RELEASE_TABLET | Freq: Every day | ORAL | 1 refills | Status: DC

## 2016-04-14 NOTE — Telephone Encounter (Signed)
Rx sent 

## 2016-06-30 ENCOUNTER — Other Ambulatory Visit: Payer: Self-pay | Admitting: Internal Medicine

## 2016-07-09 ENCOUNTER — Other Ambulatory Visit: Payer: Medicare HMO

## 2016-07-16 ENCOUNTER — Encounter: Payer: Self-pay | Admitting: Internal Medicine

## 2016-07-16 ENCOUNTER — Other Ambulatory Visit (INDEPENDENT_AMBULATORY_CARE_PROVIDER_SITE_OTHER): Payer: Medicare HMO

## 2016-07-16 ENCOUNTER — Ambulatory Visit (INDEPENDENT_AMBULATORY_CARE_PROVIDER_SITE_OTHER): Payer: Medicare HMO | Admitting: Internal Medicine

## 2016-07-16 VITALS — BP 128/80 | HR 84 | Temp 98.6°F | Resp 16 | Ht 71.0 in | Wt 189.8 lb

## 2016-07-16 DIAGNOSIS — E118 Type 2 diabetes mellitus with unspecified complications: Secondary | ICD-10-CM

## 2016-07-16 LAB — BASIC METABOLIC PANEL
BUN: 15 mg/dL (ref 6–23)
CO2: 28 mEq/L (ref 19–32)
Calcium: 9.6 mg/dL (ref 8.4–10.5)
Chloride: 103 mEq/L (ref 96–112)
Creatinine, Ser: 0.88 mg/dL (ref 0.40–1.50)
GFR: 90.18 mL/min (ref >60.00)
Glucose, Bld: 118 mg/dL — ABNORMAL HIGH (ref 70–99)
POTASSIUM: 4.6 meq/L (ref 3.5–5.1)
SODIUM: 138 meq/L (ref 135–145)

## 2016-07-16 LAB — HEMOGLOBIN A1C: HEMOGLOBIN A1C: 7.5 % — AB (ref 4.6–6.5)

## 2016-07-16 MED ORDER — INSULIN PEN NEEDLE 32G X 6 MM MISC: 1.0000 | Freq: Every day | 3 refills | Status: DC

## 2016-07-16 MED ORDER — INSULIN GLARGINE 300 UNIT/ML ~~LOC~~ SOPN: 20.0000 [IU] | PEN_INJECTOR | Freq: Every day | SUBCUTANEOUS | 1 refills | Status: DC

## 2016-07-16 MED ORDER — METFORMIN HCL 1000 MG PO TABS: 1000.0000 mg | ORAL_TABLET | Freq: Two times a day (BID) | ORAL | 1 refills | Status: DC

## 2016-07-16 NOTE — Progress Notes (Signed)
Subjective:  Patient ID: Angel Rodgers, male    DOB: 24-Jan-1946  Age: 71 y.o. MRN: [id number]  CC: Diabetes   HPI Angel Rodgers presents for Follow-up on diabetes. He feels well and offers no complaints.  Outpatient Medications Prior to Visit  Medication Sig Dispense Refill  . ACCU-CHEK FASTCLIX LANCETS MISC Use BID dx E11.8 102 each 3  . Alcohol Swabs (B-D SINGLE USE SWABS REGULAR) PADS Use as directed top check blood sugars dx E11.8 100 each 3  . aspirin EC 81 MG tablet Take 1 tablet (81 mg total) by mouth daily. 90 tablet 3  . Blood Glucose Calibration (ACCU-CHEK SMARTVIEW CONTROL) LIQD Use BID dx e11.8 3 each 3  . Blood Glucose Monitoring Suppl (ACCU-CHEK NANO SMARTVIEW) W/DEVICE KIT TEST BLOOD GLUCOSE TWICE DAILY  (FOR DIABETES, E11.8) 1 kit 2  . glucose blood (ACCU-CHEK SMARTVIEW) test strip Use twice daily to check blood sugar Dx E11.8 200 each 3  . pantoprazole (PROTONIX) 40 MG tablet Take 1 tablet (40 mg total) by mouth daily. 90 tablet 1  . simvastatin (ZOCOR) 40 MG tablet Take 1 tablet (40 mg total) by mouth at bedtime. 90 tablet 3  . metFORMIN (GLUCOPHAGE) 1000 MG tablet Take 1 tablet (1,000 mg total) by mouth 2 (two) times daily with a meal. appt needed for refills 60 tablet 0  . 0.9 %  sodium chloride infusion      No facility-administered medications prior to visit.     ROS Review of Systems  Constitutional: Negative for appetite change, diaphoresis, fatigue and unexpected weight change.  HENT: Negative.  Negative for trouble swallowing.   Eyes: Negative for visual disturbance.  Respiratory: Negative for cough, chest tightness, shortness of breath and wheezing.   Cardiovascular: Negative for chest pain, palpitations and leg swelling.  Gastrointestinal: Negative for abdominal pain, diarrhea, nausea and vomiting.  Endocrine: Negative.  Negative for polydipsia, polyphagia and polyuria.  Genitourinary: Negative for difficulty urinating.  Musculoskeletal: Negative.   Negative for back pain and myalgias.  Skin: Negative.   Allergic/Immunologic: Negative.   Neurological: Negative.   Hematological: Negative for adenopathy. Does not bruise/bleed easily.  Psychiatric/Behavioral: Negative.     Objective:  BP 128/80 (BP Location: Left Arm, Patient Position: Sitting, Cuff Size: Normal)   Pulse 84   Temp 98.6 F (37 C) (Oral)   Resp 16   Ht _0  (1.803 m)   Wt 189 lb 12 oz (86.1 kg)   SpO2 98%   BMI 26.46 kg/m   BP Readings from Last 3 Encounters:  07/16/16 128/80  03/31/16 112/65  03/02/16 124/64    Wt Readings from Last 3 Encounters:  07/16/16 189 lb 12 oz (86.1 kg)  03/31/16 189 lb (85.7 kg)  03/02/16 189 lb 2 oz (85.8 kg)    Physical Exam  Constitutional: He is oriented to person, place, and time. No distress.  HENT:  Mouth/Throat: Oropharynx is clear and moist. No oropharyngeal exudate.  Eyes: Conjunctivae are normal. Right eye exhibits no discharge. Left eye exhibits no discharge. No scleral icterus.  Neck: Normal range of motion. Neck supple.  Cardiovascular: Normal rate and regular rhythm.  Exam reveals no gallop and no friction rub.   No murmur heard. Pulmonary/Chest: Effort normal and breath sounds normal. No respiratory distress. He has no wheezes.  Abdominal: Soft. Bowel sounds are normal. He exhibits no mass. There is no tenderness.  Musculoskeletal: Normal range of motion. He exhibits no edema or deformity.  Lymphadenopathy:    He has  no cervical adenopathy.  Neurological: He is alert and oriented to person, place, and time.  Skin: Skin is warm. He is not diaphoretic.  Vitals reviewed.   Lab Results  Component Value Date   WBC 7.9 11/28/2015   HGB 14.7 11/28/2015   HCT 44.3 11/28/2015   PLT 160.0 11/28/2015   GLUCOSE 118 (H) 07/16/2016   CHOL 111 11/28/2015   TRIG 63.0 11/28/2015   HDL 43.50 11/28/2015   LDLDIRECT 169.8 02/21/2010   LDLCALC 54 11/28/2015   ALT 39 11/28/2015   AST 26 11/28/2015   NA 138  07/16/2016   K 4.6 07/16/2016   CL 103 07/16/2016   CREATININE 0.88 07/16/2016   BUN 15 07/16/2016   CO2 28 07/16/2016   TSH 1.99 11/28/2015   PSA 0.85 10/31/2014   HGBA1C 7.5 (H) 07/16/2016   MICROALBUR <0.7 11/28/2015    No results found.  Assessment & Plan:   Angel Rodgers was seen today for diabetes.  Diagnoses and all orders for this visit:  Type 2 diabetes mellitus with complication, without long-term current use of insulin ([place])- his A1c is up to 7.5%, I've asked him to continue taking metformin but to add basal insulin as well as for better blood sugar control. I've also asked him to be seen by diabetic education to help with administering the insulin and monitoring his blood sugars. -     Basic metabolic panel; Future -     Hemoglobin A1c; Future -     metFORMIN (GLUCOPHAGE) 1000 MG tablet; Take 1 tablet (1,000 mg total) by mouth 2 (two) times daily with a meal. -     Amb Referral to Nutrition and Diabetic E -     Insulin Glargine (TOUJEO SOLOSTAR) 300 UNIT/ML SOPN; Inject 20 Units into the skin daily. -     Insulin Pen Needle (NOVOFINE) 32G X 6 MM MISC; 1 Act by Does not apply route daily.   I have changed Angel Rodgers's metFORMIN. I am also having him start on Insulin Glargine and Insulin Pen Needle. Additionally, I am having him maintain his aspirin EC, ACCU-CHEK FASTCLIX LANCETS, B-D SINGLE USE SWABS REGULAR, ACCU-CHEK SMARTVIEW CONTROL, ACCU-CHEK NANO SMARTVIEW, glucose blood, simvastatin, and pantoprazole. We will stop administering sodium chloride.  Meds ordered this encounter  Medications  . metFORMIN (GLUCOPHAGE) 1000 MG tablet    Sig: Take 1 tablet (1,000 mg total) by mouth 2 (two) times daily with a meal.    Dispense:  180 tablet    Refill:  1    appt needed for refills  . Insulin Glargine (TOUJEO SOLOSTAR) 300 UNIT/ML SOPN    Sig: Inject 20 Units into the skin daily.    Dispense:  9 mL    Refill:  1  . Insulin Pen Needle (NOVOFINE) 32G X 6 MM MISC    Sig: 1 Act  by Does not apply route daily.    Dispense:  100 each    Refill:  3     Follow-up: Return in about 6 months (around 01/15/2017).  Scarlette Calico, MD

## 2016-07-16 NOTE — Patient Instructions (Signed)

## 2016-08-17 ENCOUNTER — Telehealth: Payer: Self-pay | Admitting: Internal Medicine

## 2016-08-17 MED ORDER — ACCU-CHEK NANO SMARTVIEW W/DEVICE KIT: PACK | 2 refills | Status: DC

## 2016-08-17 NOTE — Telephone Encounter (Signed)
Erx has been sent. 

## 2016-08-17 NOTE — Telephone Encounter (Signed)
Pt BS monitor quit working and he would dike a new one, he changed the batteries but it still wont work

## 2016-10-01 ENCOUNTER — Other Ambulatory Visit: Payer: Self-pay | Admitting: Internal Medicine

## 2016-11-23 ENCOUNTER — Other Ambulatory Visit: Payer: Self-pay | Admitting: Internal Medicine

## 2016-11-23 DIAGNOSIS — E118 Type 2 diabetes mellitus with unspecified complications: Secondary | ICD-10-CM

## 2016-12-30 ENCOUNTER — Other Ambulatory Visit: Payer: Self-pay | Admitting: Internal Medicine

## 2016-12-30 DIAGNOSIS — E785 Hyperlipidemia, unspecified: Secondary | ICD-10-CM

## 2017-02-19 NOTE — Progress Notes (Addendum)
Subjective:   Angel Rodgers is a 72 y.o. male who presents for Medicare Annual/Subsequent preventive examination.  Review of Systems:  No ROS.  Medicare Wellness Visit. Additional risk factors are reflected in the social history.  Cardiac Risk Factors include: advanced age (>50mn, >>44women);diabetes mellitus;dyslipidemia;hypertension;male gender Sleep patterns: feels rested on waking, gets up 1 times nightly to void and sleeps 6-7 hours nightly.    Home Safety/Smoke Alarms: Feels safe in home. Smoke alarms in place.  Living environment; residence and Firearm Safety: 1-story house/ trailer, no firearms. Lives with wife, no needs for DME, good support system Seat Belt Safety/Bike Helmet: Wears seat belt.      Objective:    Vitals: BP 128/68   Pulse 64   Resp 20   Ht '5\' 11"'$  (1.803 m)   Wt 196 lb (88.9 kg)   SpO2 98%   BMI 27.34 kg/m   Body mass index is 27.34 kg/m.  Advanced Directives 02/22/2017 03/31/2016 11/30/2015 11/05/2015 10/31/2014 10/25/2014  Does Patient Have a Medical Advance Directive? Yes No No Yes Yes Yes  Type of AParamedicof [place] will - - - [place] -  Does patient want to make changes to medical advance directive? - - - - No - Patient declined -  Copy of [place]? No - copy requested - - - Yes Yes  Would patient like information on creating a medical advance directive? - - No - patient declined information - - -    Tobacco Social History   Tobacco Use  Smoking Status Former Smoker  . Packs/day: 0.50  . Years: 20.00  . Pack years: 10.00  . Types: Cigarettes, Pipe  . Last attempt to quit: 02/17/1983  . Years since quitting: 34.0  Smokeless Tobacco Never Used  Tobacco Comment   Smoked until 83/ smoked pipe x 5 years     Counseling given: Not Answered Comment: Smoked until 83/ smoked pipe x 5 years   Past Medical History:  Diagnosis Date  . Diverticulosis     . DM (diabetes mellitus) ([place]   . Endocarditis, valve unspecified, unspecified cause   . GERD (gastroesophageal reflux disease)   . History of rheumatic fever   . HLD (hyperlipidemia)   . Syncope and collapse   . Tubular adenoma of colon    Past Surgical History:  Procedure Laterality Date  . NO PAST SURGERIES     Family History  Problem Relation Age of Onset  . Coronary artery disease Mother        AICD/PACER  . Hypertension Mother   . Diabetes Mother   . Hyperlipidemia Mother   . Coronary artery disease Father        AICD Pacer  . Cancer Father        ANAL  . Colon cancer Maternal Uncle   . Stomach cancer Other        materal great uncle and great aunt  . Prostate cancer Neg Hx    Social History   Socioeconomic History  . Marital status: Married    Spouse name: None  . Number of children: 2  . Years of education: 123 . Highest education level: None  Social Needs  . Financial resource strain: Not hard at all  . Food insecurity - worry: Never true  . Food insecurity - inability: Never true  . Transportation needs - medical: No  . Transportation needs - non-medical: No  Occupational  History  . Occupation: Education administrator: RETIRED  Tobacco Use  . Smoking status: Former Smoker    Packs/day: 0.50    Years: 20.00    Pack years: 10.00    Types: Cigarettes, Pipe    Last attempt to quit: 02/17/1983    Years since quitting: 34.0  . Smokeless tobacco: Never Used  . Tobacco comment: Smoked until 83/ smoked pipe x 5 years  Substance and Sexual Activity  . Alcohol use: Yes    Alcohol/week: 0.0 oz    Comment: occasional beer every week or 2  . Drug use: No  . Sexual activity: Yes  Other Topics Concern  . None  Social History Narrative   HSG, community college 2 years. [employer]-3 years, E5 at discharge. Married  '65. 1 son '70, 1 dtr '69; 2 grandchildren. 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  ACCU-CHEK FASTCLIX LANCETS MISC Use BID dx E11.8  . Alcohol Swabs (B-D SINGLE USE SWABS REGULAR) PADS Use as directed top check blood sugars dx E11.8  . aspirin EC 81 MG tablet Take 1 tablet (81 mg total) by mouth daily.  . Blood Glucose Calibration (ACCU-CHEK SMARTVIEW CONTROL) LIQD Use BID dx e11.8  . Blood Glucose Monitoring Suppl (ACCU-CHEK NANO SMARTVIEW) w/Device KIT Use to check blood sugar. DX: E11.9  . glucose blood (ACCU-CHEK SMARTVIEW) test strip Use twice daily to check blood sugar Dx E11.8  . metFORMIN (GLUCOPHAGE) 1000 MG tablet Take 1 tablet (1,000 mg total) by mouth 2 (two) times daily with a meal.  . pantoprazole (PROTONIX) 40 MG tablet TAKE 1 TABLET EVERY DAY  . simvastatin (ZOCOR) 40 MG tablet TAKE 1 TABLET AT BEDTIME  . [DISCONTINUED] Insulin Glargine (TOUJEO SOLOSTAR) 300 UNIT/ML SOPN Inject 20 Units into the skin daily. (Patient not taking: Reported on 02/22/2017)  . [DISCONTINUED] Insulin Pen Needle (NOVOFINE) 32G X 6 MM MISC 1 Act by Does not apply route daily. (Patient not taking: Reported on 02/22/2017)   No facility-administered encounter medications on file as of 02/22/2017.     Activities of Daily Living In your present state of health, do you have any difficulty performing the following activities: 02/22/2017  Hearing? N  Vision? N  Difficulty concentrating or making decisions? N  Walking or climbing stairs? N  Dressing or bathing? N  Doing errands, shopping? N  Preparing Food and eating ? N  Using the Toilet? N  In the past six months, have you accidently leaked urine? N  Do you have problems with loss of bowel control? N  Managing your Medications? N  Managing your Finances? N  Housekeeping or managing your Housekeeping? N  Some recent data might be hidden    Patient Care Team: Janith Lima, MD as PCP - General (Internal Medicine) Dorrene German Shelda Altes., MD (Gastroenterology)   Assessment:   This is a routine wellness examination for [name]. Physical  assessment deferred to PCP.   Exercise Activities and Dietary recommendations Current Exercise Habits: The patient does not participate in regular exercise at present, Exercise limited by: None identified  Diet (meal preparation, eat out, water intake, caffeinated beverages, dairy products, fruits and vegetables): in general, a "healthy" diet  , well balanced   Reviewed heart healthy and diabetic diet, encouraged patient to increase daily water intake.   Goals      Patient Stated   . control sweets (pt-stated)     Will slow  down eating sweets of which the wife will assist. Uses sugar free in cooking HFCS above;       Other   . Exercise 3x per week (30 min per time)     Walk 30 minutes three times per week    . Patient Stated     Get my sugar down by watching carbohydrates and sugar, join [employer].       Fall Risk Fall Risk  11/30/2015 11/05/2015 10/31/2014 10/25/2014 12/26/2013  Falls in the past year? No No No No No    Depression Screen PHQ 2/9 Scores 11/30/2015 11/05/2015 10/31/2014 10/25/2014  PHQ - 2 Score 0 0 0 0    Cognitive Function MMSE - Mini Mental State Exam 02/22/2017 11/05/2015 10/25/2014  Not completed: - (No Data) (No Data)  Orientation to time 5 - -  Orientation to Place 5 - -  Registration 3 - -  Attention/ Calculation 5 - -  Recall 2 - -  Language- name 2 objects 2 - -  Language- repeat 1 - -  Language- follow 3 step command 3 - -  Language- read & follow direction 1 - -  Write a sentence 1 - -  Copy design 1 - -  Total score 29 - -        Immunization History  Administered Date(s) Administered  . Pneumococcal Conjugate-13 08/21/2013  . Pneumococcal Polysaccharide-23 10/25/2012  . Td 03/07/2010   Screening Tests Health Maintenance  Topic Date Due  . FOOT EXAM  11/27/2016  . URINE MICROALBUMIN  11/27/2016  . HEMOGLOBIN A1C  01/15/2017  . INFLUENZA VACCINE  10/25/2017 (Originally 09/16/2016)  . OPHTHALMOLOGY EXAM  10/17/2017  .  TETANUS/TDAP  03/07/2020  . COLONOSCOPY  07/04/2024  . Hepatitis C Screening  Completed  . PNA vac Low Risk Adult  Completed      Plan:    Continue doing brain stimulating activities (puzzles, reading, adult coloring books, staying active) to keep memory sharp.   Continue to eat heart healthy diet (full of fruits, vegetables, whole grains, lean protein, water--limit salt, fat, and sugar intake) and increase physical activity as tolerated.  I have personally reviewed and noted the following in the patient's chart:   . Medical and social history . Use of alcohol, tobacco or illicit drugs  . Current medications and supplements . Functional ability and status . Nutritional status . Physical activity . Advanced directives . List of other physicians . Vitals . Screenings to include cognitive, depression, and falls . Referrals and appointments  In addition, I have reviewed and discussed with patient certain preventive protocols, quality metrics, and best practice recommendations. A written personalized care plan for preventive services as well as general preventive health recommendations were provided to patient.   Medical screening examination/treatment/procedure(s) were performed by non-physician practitioner and as supervising physician I was immediately available for consultation/collaboration. I agree with above. Scarlette Calico, MD   Michiel Cowboy, RN  02/22/2017

## 2017-02-22 ENCOUNTER — Ambulatory Visit (INDEPENDENT_AMBULATORY_CARE_PROVIDER_SITE_OTHER): Payer: Medicare HMO | Admitting: *Deleted

## 2017-02-22 VITALS — BP 128/68 | HR 64 | Resp 20 | Ht 71.0 in | Wt 196.0 lb

## 2017-02-22 DIAGNOSIS — E118 Type 2 diabetes mellitus with unspecified complications: Secondary | ICD-10-CM | POA: Diagnosis not present

## 2017-02-22 DIAGNOSIS — Z0000 Encounter for general adult medical examination without abnormal findings: Secondary | ICD-10-CM | POA: Diagnosis not present

## 2017-02-22 NOTE — Patient Instructions (Signed)
Continue doing brain stimulating activities (puzzles, reading, adult coloring books, staying active) to keep memory sharp.   Continue to eat heart healthy diet (full of fruits, vegetables, whole grains, lean protein, water--limit salt, fat, and sugar intake) and increase physical activity as tolerated.   Angel Rodgers , Thank you for taking time to come for your Medicare Wellness Visit. I appreciate your ongoing commitment to your health goals. Please review the following plan we discussed and let me know if I can assist you in the future.   These are the goals we discussed: Goals      Patient Stated   . control sweets (pt-stated)     Will slow down eating sweets of which the wife will assist. Uses sugar free in cooking HFCS above;       Other   . Exercise 3x per week (30 min per time)     Walk 30 minutes three times per week    . Patient Stated     Get my sugar down by watching carbohydrates and sugar, join [employer].       This is a list of the screening recommended for you and due dates:  Health Maintenance  Topic Date Due  . Flu Shot  09/16/2016  . Complete foot exam   11/27/2016  . Urine Protein Check  11/27/2016  . Eye exam for diabetics  12/10/2016  . Hemoglobin A1C  01/15/2017  . Tetanus Vaccine  03/07/2020  . Colon Cancer Screening  07/04/2024  .  Hepatitis C: One time screening is recommended by Center for Disease Control  (CDC) for  adults born from [birth year] through 1967.   Completed  . Pneumonia vaccines  Completed

## 2017-02-23 ENCOUNTER — Telehealth: Payer: Self-pay | Admitting: *Deleted

## 2017-02-23 ENCOUNTER — Ambulatory Visit: Payer: Medicare HMO

## 2017-02-23 NOTE — Telephone Encounter (Signed)
Called and spoke to patient regarding having labs drawn before having physical with PCP. Nurse informed patient that insurance may not fully pay to have labs performed before his physical with provider. Patient stated that he understood and stated that he would wait to have labs after his visit with Dr. Ronnald Ramp.

## 2017-03-03 ENCOUNTER — Telehealth: Payer: Self-pay | Admitting: Internal Medicine

## 2017-03-03 ENCOUNTER — Other Ambulatory Visit (INDEPENDENT_AMBULATORY_CARE_PROVIDER_SITE_OTHER): Payer: Medicare HMO

## 2017-03-03 ENCOUNTER — Ambulatory Visit (INDEPENDENT_AMBULATORY_CARE_PROVIDER_SITE_OTHER): Payer: Medicare HMO | Admitting: Internal Medicine

## 2017-03-03 ENCOUNTER — Encounter: Payer: Self-pay | Admitting: Internal Medicine

## 2017-03-03 ENCOUNTER — Encounter: Payer: Medicare HMO | Admitting: Internal Medicine

## 2017-03-03 VITALS — BP 110/70 | HR 84 | Temp 97.8°F | Ht 71.0 in | Wt 195.0 lb

## 2017-03-03 DIAGNOSIS — K7581 Nonalcoholic steatohepatitis (NASH): Secondary | ICD-10-CM | POA: Insufficient documentation

## 2017-03-03 DIAGNOSIS — K2100 Gastro-esophageal reflux disease with esophagitis, without bleeding: Secondary | ICD-10-CM

## 2017-03-03 DIAGNOSIS — E785 Hyperlipidemia, unspecified: Secondary | ICD-10-CM

## 2017-03-03 DIAGNOSIS — R7989 Other specified abnormal findings of blood chemistry: Secondary | ICD-10-CM

## 2017-03-03 DIAGNOSIS — R945 Abnormal results of liver function studies: Secondary | ICD-10-CM

## 2017-03-03 DIAGNOSIS — E118 Type 2 diabetes mellitus with unspecified complications: Secondary | ICD-10-CM

## 2017-03-03 DIAGNOSIS — Z23 Encounter for immunization: Secondary | ICD-10-CM

## 2017-03-03 LAB — CBC WITH DIFFERENTIAL/PLATELET
Basophils Absolute: 0.1 10*3/uL (ref 0.0–0.1)
Basophils Relative: 1.3 % (ref 0.0–3.0)
EOS PCT: 3.4 % (ref 0.0–5.0)
Eosinophils Absolute: 0.3 10*3/uL (ref 0.0–0.7)
HCT: 46.8 % (ref 39.0–52.0)
Hemoglobin: 15.3 g/dL (ref 13.0–17.0)
LYMPHS ABS: 2.7 10*3/uL (ref 0.7–4.0)
Lymphocytes Relative: 30.1 % (ref 12.0–46.0)
MCHC: 32.7 g/dL (ref 30.0–36.0)
MCV: 94 fl (ref 78.0–100.0)
MONO ABS: 0.9 10*3/uL (ref 0.1–1.0)
Monocytes Relative: 10.2 % (ref 3.0–12.0)
Neutro Abs: 5 10*3/uL (ref 1.4–7.7)
Neutrophils Relative %: 55 % (ref 43.0–77.0)
Platelets: 164 10*3/uL (ref 150.0–400.0)
RBC: 4.98 Mil/uL (ref 4.22–5.81)
RDW: 13.7 % (ref 11.5–15.5)
WBC: 9.1 10*3/uL (ref 4.0–10.5)

## 2017-03-03 LAB — URINALYSIS, ROUTINE W REFLEX MICROSCOPIC
Bilirubin Urine: NEGATIVE
Hgb urine dipstick: NEGATIVE
KETONES UR: NEGATIVE
Leukocytes, UA: NEGATIVE
Nitrite: NEGATIVE
PH: 6 (ref 5.0–8.0)
RBC / HPF: NONE SEEN (ref 0–?)
SPECIFIC GRAVITY, URINE: 1.025 (ref 1.000–1.030)
Total Protein, Urine: NEGATIVE
UROBILINOGEN UA: 0.2 (ref 0.0–1.0)
Urine Glucose: 250 — AB

## 2017-03-03 LAB — LIPID PANEL
CHOLESTEROL: 113 mg/dL (ref 0–200)
HDL: 44.2 mg/dL (ref >39.00)
LDL Cholesterol: 52 mg/dL (ref 0–99)
NonHDL: 68.46
TRIGLYCERIDES: 83 mg/dL (ref 0.0–149.0)
Total CHOL/HDL Ratio: 3
VLDL: 16.6 mg/dL (ref 0.0–40.0)

## 2017-03-03 LAB — COMPREHENSIVE METABOLIC PANEL
ALBUMIN: 4.4 g/dL (ref 3.5–5.2)
ALT: 86 U/L — ABNORMAL HIGH (ref 0–53)
AST: 56 U/L — ABNORMAL HIGH (ref 0–37)
Alkaline Phosphatase: 79 U/L (ref 39–117)
BUN: 17 mg/dL (ref 6–23)
CHLORIDE: 103 meq/L (ref 96–112)
CO2: 28 mEq/L (ref 19–32)
Calcium: 9.5 mg/dL (ref 8.4–10.5)
Creatinine, Ser: 0.94 mg/dL (ref 0.40–1.50)
GFR: 83.42 mL/min (ref >60.00)
Glucose, Bld: 111 mg/dL — ABNORMAL HIGH (ref 70–99)
POTASSIUM: 5.1 meq/L (ref 3.5–5.1)
SODIUM: 139 meq/L (ref 135–145)
Total Bilirubin: 0.6 mg/dL (ref 0.2–1.2)
Total Protein: 6.9 g/dL (ref 6.0–8.3)

## 2017-03-03 LAB — TSH: TSH: 2.12 u[IU]/mL (ref 0.35–4.50)

## 2017-03-03 LAB — MICROALBUMIN / CREATININE URINE RATIO
Creatinine,U: 101.2 mg/dL
Microalb Creat Ratio: 0.7 mg/g (ref 0.0–30.0)
Microalb, Ur: 0.7 mg/dL (ref 0.0–1.9)

## 2017-03-03 LAB — HEMOGLOBIN A1C: HEMOGLOBIN A1C: 7.6 % — AB (ref 4.6–6.5)

## 2017-03-03 MED ORDER — ZOSTER VAC RECOMB ADJUVANTED 50 MCG/0.5ML IM SUSR: 0.5000 mL | Freq: Once | INTRAMUSCULAR | 1 refills | Status: AC

## 2017-03-03 MED ORDER — PANTOPRAZOLE SODIUM 40 MG PO TBEC: 40.0000 mg | DELAYED_RELEASE_TABLET | Freq: Every day | ORAL | 0 refills | Status: DC

## 2017-03-03 NOTE — Telephone Encounter (Signed)
Rx sent 

## 2017-03-03 NOTE — Progress Notes (Signed)
Subjective:  Patient ID: Angel Rodgers, male    DOB: October 30, 1945  Age: 72 y.o. MRN: [id number]  CC: Diabetes and Hyperlipidemia   HPI Angel Rodgers presents for f/up - he complains of weight gain and does not think his blood sugars are very well controlled.  He is compliant with metformin and denies any polys.  Outpatient Medications Prior to Visit  Medication Sig Dispense Refill  . ACCU-CHEK FASTCLIX LANCETS MISC Use BID dx E11.8 102 each 3  . Alcohol Swabs (B-D SINGLE USE SWABS REGULAR) PADS Use as directed top check blood sugars dx E11.8 100 each 3  . aspirin EC 81 MG tablet Take 1 tablet (81 mg total) by mouth daily. 90 tablet 3  . Blood Glucose Calibration (ACCU-CHEK SMARTVIEW CONTROL) LIQD Use BID dx e11.8 3 each 3  . Blood Glucose Monitoring Suppl (ACCU-CHEK NANO SMARTVIEW) w/Device KIT Use to check blood sugar. DX: E11.9 1 kit 2  . glucose blood (ACCU-CHEK SMARTVIEW) test strip Use twice daily to check blood sugar Dx E11.8 200 each 3  . metFORMIN (GLUCOPHAGE) 1000 MG tablet Take 1 tablet (1,000 mg total) by mouth 2 (two) times daily with a meal. 180 tablet 1  . simvastatin (ZOCOR) 40 MG tablet TAKE 1 TABLET AT BEDTIME 90 tablet 1  . pantoprazole (PROTONIX) 40 MG tablet TAKE 1 TABLET EVERY DAY 90 tablet 1   No facility-administered medications prior to visit.     ROS Review of Systems  Constitutional: Positive for unexpected weight change. Negative for chills, diaphoresis and fatigue.  HENT: Negative.   Eyes: Negative for visual disturbance.  Respiratory: Negative for cough, chest tightness, shortness of breath and wheezing.   Cardiovascular: Negative for chest pain, palpitations and leg swelling.  Gastrointestinal: Negative for abdominal pain, constipation, diarrhea, nausea and vomiting.  Endocrine: Negative for polydipsia, polyphagia and polyuria.  Genitourinary: Negative.  Negative for decreased urine volume, difficulty urinating, dysuria and urgency.  Musculoskeletal:  Negative for back pain, myalgias and neck pain.  Skin: Negative.  Negative for color change and rash.  Allergic/Immunologic: Negative.   Neurological: Negative.  Negative for dizziness and light-headedness.  Hematological: Negative for adenopathy. Does not bruise/bleed easily.  Psychiatric/Behavioral: Negative.     Objective:  BP 110/70   Pulse 84   Temp 97.8 F (36.6 C) (Oral)   Ht 5' 11"  (1.803 m)   Wt 195 lb (88.5 kg)   SpO2 98%   BMI 27.20 kg/m   BP Readings from Last 3 Encounters:  03/03/17 110/70  02/22/17 128/68  07/16/16 128/80    Wt Readings from Last 3 Encounters:  03/03/17 195 lb (88.5 kg)  02/22/17 196 lb (88.9 kg)  07/16/16 189 lb 12 oz (86.1 kg)    Physical Exam  Constitutional: He is oriented to person, place, and time. No distress.  HENT:  Mouth/Throat: Oropharynx is clear and moist. No oropharyngeal exudate.  Eyes: Conjunctivae are normal. Left eye exhibits no discharge. No scleral icterus.  Neck: Normal range of motion. Neck supple. No JVD present. No thyromegaly present.  Cardiovascular: Normal rate, regular rhythm and normal heart sounds. Exam reveals no gallop.  No murmur heard. Pulmonary/Chest: Effort normal and breath sounds normal. No respiratory distress. He has no wheezes. He has no rales.  Abdominal: Soft. Bowel sounds are normal. He exhibits no distension and no mass. There is no tenderness.  Musculoskeletal: Normal range of motion. He exhibits no edema, tenderness or deformity.  Lymphadenopathy:    He has no cervical adenopathy.  Neurological: He is alert and oriented to person, place, and time.  Skin: Skin is warm and dry. No rash noted. He is not diaphoretic. No erythema. No pallor.  Vitals reviewed.   Lab Results  Component Value Date   WBC 9.1 03/03/2017   HGB 15.3 03/03/2017   HCT 46.8 03/03/2017   PLT 164.0 03/03/2017   GLUCOSE 111 (H) 03/03/2017   CHOL 113 03/03/2017   TRIG 83.0 03/03/2017   HDL 44.20 03/03/2017    LDLDIRECT 169.8 02/21/2010   LDLCALC 52 03/03/2017   ALT 86 (H) 03/03/2017   AST 56 (H) 03/03/2017   NA 139 03/03/2017   K 5.1 03/03/2017   CL 103 03/03/2017   CREATININE 0.94 03/03/2017   BUN 17 03/03/2017   CO2 28 03/03/2017   TSH 2.12 03/03/2017   PSA 0.85 10/31/2014   HGBA1C 7.6 (H) 03/03/2017   MICROALBUR 0.7 03/03/2017    No results found.  Assessment & Plan:   Angel Rodgers was seen today for diabetes and hyperlipidemia.  Diagnoses and all orders for this visit:  Type 2 diabetes mellitus with complication, without long-term current use of insulin ([place])- His A1c is up to 7.6%.  This is a slight increase over his prior A1c.  This is mostly related to lifestyle modifications and dietary indiscretions.  I have asked him to improve on both of these. -     Comprehensive metabolic panel; Future -     Hemoglobin A1c; Future -     Urinalysis, Routine w reflex microscopic; Future -     Microalbumin / creatinine urine ratio; Future  Gastroesophageal reflux disease with esophagitis- His symptoms are adequately well controlled. -     CBC with Differential/Platelet; Future  Hyperlipidemia with target LDL less than 70- He has achieved his LDL goal and is doing well on the statin. -     Comprehensive metabolic panel; Future -     Lipid panel; Future -     TSH; Future  Need for shingles vaccine -     Zoster Vaccine Adjuvanted [facility]) injection; Inject 0.5 mLs into the muscle once for 1 dose.  Elevated LFTs- He has a very mild elevation in his ALT and AST.  I think this is fatty liver disease.  He certainly is not symptomatic with this.  I have ordered an ultrasound to see what the texture of the liver looks like. -     Cancel: US Abdomen Complete; Future -     US Abdomen Complete; Future   I am having Angel Rodgers start on Zoster Vaccine Adjuvanted. I am also having him maintain his aspirin EC, ACCU-CHEK FASTCLIX LANCETS, B-D SINGLE USE SWABS REGULAR, ACCU-CHEK SMARTVIEW CONTROL,  glucose blood, metFORMIN, ACCU-CHEK NANO SMARTVIEW, and simvastatin.  Meds ordered this encounter  Medications  . Zoster Vaccine Adjuvanted [facility]) injection    Sig: Inject 0.5 mLs into the muscle once for 1 dose.    Dispense:  0.5 mL    Refill:  1     Follow-up: Return in about 6 months (around 08/31/2017).  Scarlette Calico, MD

## 2017-03-03 NOTE — Patient Instructions (Signed)

## 2017-03-04 ENCOUNTER — Encounter: Payer: Self-pay | Admitting: Internal Medicine

## 2017-03-26 ENCOUNTER — Other Ambulatory Visit: Payer: Self-pay | Admitting: Internal Medicine

## 2017-03-26 ENCOUNTER — Ambulatory Visit
Admission: RE | Admit: 2017-03-26 | Discharge: 2017-03-26 | Disposition: A | Discharge status: Home / Self Care | Payer: Medicare HMO | Source: Ambulatory Visit | Attending: Internal Medicine | Admitting: Internal Medicine

## 2017-03-26 DIAGNOSIS — K769 Liver disease, unspecified: Secondary | ICD-10-CM

## 2017-03-26 DIAGNOSIS — R945 Abnormal results of liver function studies: Principal | ICD-10-CM

## 2017-03-26 DIAGNOSIS — K7689 Other specified diseases of liver: Secondary | ICD-10-CM | POA: Diagnosis not present

## 2017-03-26 DIAGNOSIS — R160 Hepatomegaly, not elsewhere classified: Secondary | ICD-10-CM

## 2017-03-26 DIAGNOSIS — R7989 Other specified abnormal findings of blood chemistry: Secondary | ICD-10-CM

## 2017-04-10 ENCOUNTER — Ambulatory Visit
Admission: RE | Admit: 2017-04-10 | Discharge: 2017-04-10 | Disposition: A | Discharge status: Home / Self Care | Payer: Medicare HMO | Source: Ambulatory Visit | Attending: Internal Medicine | Admitting: Internal Medicine

## 2017-04-10 DIAGNOSIS — R160 Hepatomegaly, not elsewhere classified: Secondary | ICD-10-CM

## 2017-04-10 DIAGNOSIS — R7989 Other specified abnormal findings of blood chemistry: Secondary | ICD-10-CM

## 2017-04-10 DIAGNOSIS — R945 Abnormal results of liver function studies: Secondary | ICD-10-CM

## 2017-04-10 DIAGNOSIS — K7689 Other specified diseases of liver: Secondary | ICD-10-CM | POA: Diagnosis not present

## 2017-04-10 DIAGNOSIS — K769 Liver disease, unspecified: Secondary | ICD-10-CM

## 2017-04-10 MED ORDER — GADOBENATE DIMEGLUMINE 529 MG/ML IV SOLN
18.0000 mL | Freq: Once | INTRAVENOUS | Status: AC | PRN
  Administered 2017-04-10: 18 mL via INTRAVENOUS

## 2017-04-11 ENCOUNTER — Encounter: Payer: Self-pay | Admitting: Internal Medicine

## 2017-04-20 ENCOUNTER — Encounter: Payer: Self-pay | Admitting: *Deleted

## 2017-04-29 ENCOUNTER — Ambulatory Visit: Payer: Medicare HMO | Admitting: Internal Medicine

## 2017-04-29 ENCOUNTER — Encounter: Payer: Self-pay | Admitting: Internal Medicine

## 2017-04-29 ENCOUNTER — Other Ambulatory Visit (INDEPENDENT_AMBULATORY_CARE_PROVIDER_SITE_OTHER): Payer: Medicare HMO

## 2017-04-29 ENCOUNTER — Other Ambulatory Visit: Payer: Self-pay | Admitting: Internal Medicine

## 2017-04-29 VITALS — BP 114/66 | HR 86 | Ht 71.0 in | Wt 191.1 lb

## 2017-04-29 DIAGNOSIS — K219 Gastro-esophageal reflux disease without esophagitis: Secondary | ICD-10-CM | POA: Diagnosis not present

## 2017-04-29 DIAGNOSIS — Z86010 Personal history of colonic polyps: Secondary | ICD-10-CM | POA: Diagnosis not present

## 2017-04-29 DIAGNOSIS — K760 Fatty (change of) liver, not elsewhere classified: Secondary | ICD-10-CM

## 2017-04-29 DIAGNOSIS — E118 Type 2 diabetes mellitus with unspecified complications: Secondary | ICD-10-CM

## 2017-04-29 LAB — HEPATIC FUNCTION PANEL
ALBUMIN: 4.1 g/dL (ref 3.5–5.2)
ALT: 90 U/L — AB (ref 0–53)
AST: 59 U/L — AB (ref 0–37)
Alkaline Phosphatase: 78 U/L (ref 39–117)
Bilirubin, Direct: 0.2 mg/dL (ref 0.0–0.3)
TOTAL PROTEIN: 6.7 g/dL (ref 6.0–8.3)
Total Bilirubin: 0.7 mg/dL (ref 0.2–1.2)

## 2017-04-29 LAB — FERRITIN: FERRITIN: 162.1 ng/mL (ref 22.0–322.0)

## 2017-04-29 MED ORDER — PANTOPRAZOLE SODIUM 40 MG PO TBEC: 40.0000 mg | DELAYED_RELEASE_TABLET | Freq: Every day | ORAL | 1 refills | Status: DC

## 2017-04-29 NOTE — Patient Instructions (Signed)
Continue pantoprazole 40 mg daily.  Your physician has requested that you go to the basement for the following lab work before leaving today: ANA, IGG, Hepatitis C, Hep B surface antigen, Hep B antibody, Ferritin, AMA, Anti smooth muscle antibody.  Please follow up with Dr Hilarie Fredrickson in 6 months.  If you are age 74 or older, your body mass index should be between 23-30. Your Body mass index is 26.66 kg/m. If this is out of the aforementioned range listed, please consider follow up with your Primary Care Provider.  If you are age 78 or younger, your body mass index should be between 19-25. Your Body mass index is 26.66 kg/m. If this is out of the aformentioned range listed, please consider follow up with your Primary Care Provider.

## 2017-04-29 NOTE — Progress Notes (Signed)
Subjective:    Patient ID: Angel Rodgers, male    DOB: Aug 17, 1945, 72 y.o.   MRN: [id number]  HPI Angel Rodgers is a 72 year old male with a past medical history of adenomatous colon polyps, diverticulosis, GERD, diabetes, hyperlipidemia who was seen in follow-up.  He was last seen in the office in January 2018 and for upper endoscopy on 03/31/2016.  He recently was found to have elevated liver enzymes by primary care in January.  This led to an ultrasound was suggested a possible solid liver lesion as well as fatty infiltration of the liver.  This was followed up by MRI of the abdomen with and without contrast which revealed diffuse fatty liver.  The liver lesion was a 3.6 cm benign hepatic hemangioma.  There were multiple tiny gallstones in the dependent portion of the gallbladder.  I reviewed the studies personally.  EGD last year revealed slightly irregular Z line which was biopsied and negative for Barrett's.  There was evidence of prepyloric scarring with a patulous pylorus raising the question of prior ulcer disease.  Biopsies showed chronic gastritis with intestinal metaplasia.  No dysplasia or H. Pylori.  He reports that he has been feeling well and his liver enzymes were found on routine physical.  He denies abdominal pain.  No jaundice or itching.  No bleeding.  No abdominal swelling or lower extremity swelling.  Denies family history of liver disease.  He rarely drinks alcohol but occasionally will drink a beer.  He states he bought a 12 pack of beer at Thanksgiving and just finished the 12 pack this week.  Bowel movements are regular and remain soft.  No blood in his stool or melena.  He has had no further issues with heartburn, dysphagia since PPI.  He uses pantoprazole 40 mg daily.  No abdominal pain.  Weight has been stable, he does report decrease in overall physical activity/exercise in the last year.  He states his wife broke her ankle and this led to a decrease in their usual  activities.   Review of Systems As per HPI, otherwise negative  Current Medications, Allergies, Past Medical History, Past Surgical History, Family History and Social History were reviewed in [employer] record.     Objective:   Physical Exam BP 114/66 (BP Location: Left Arm, Patient Position: Sitting, Cuff Size: Normal)   Pulse 86   Ht 5\' 11"  (1.803 m)   Wt 191 lb 2 oz (86.7 kg)   BMI 26.66 kg/m  Constitutional: Well-developed and well-nourished. No distress. HEENT: Normocephalic and atraumatic. Oropharynx is clear and moist. Conjunctivae are normal.  No scleral icterus. Neck: Neck supple. Trachea midline. Cardiovascular: Normal rate, regular rhythm and intact distal pulses. No M/R/G Pulmonary/chest: Effort normal and breath sounds normal. No wheezing, rales or rhonchi. Abdominal: Soft, nontender, nondistended. Bowel sounds active throughout. There are no masses palpable. No hepatosplenomegaly. Extremities: no clubbing, cyanosis, or edema Neurological: Alert and oriented to person place and time. Skin: Skin is warm and dry. Psychiatric: Normal mood and affect. Behavior is normal.  CBC    Component Value Date/Time   WBC 9.1 03/03/2017 1359   RBC 4.98 03/03/2017 1359   HGB 15.3 03/03/2017 1359   HCT 46.8 03/03/2017 1359   PLT 164.0 03/03/2017 1359   MCV 94.0 03/03/2017 1359   MCHC 32.7 03/03/2017 1359   RDW 13.7 03/03/2017 1359   LYMPHSABS 2.7 03/03/2017 1359   MONOABS 0.9 03/03/2017 1359   EOSABS 0.3 03/03/2017 1359   BASOSABS  0.1 03/03/2017 1359   CMP     Component Value Date/Time   NA 139 03/03/2017 1359   K 5.1 03/03/2017 1359   CL 103 03/03/2017 1359   CO2 28 03/03/2017 1359   GLUCOSE 111 (H) 03/03/2017 1359   BUN 17 03/03/2017 1359   CREATININE 0.94 03/03/2017 1359   CALCIUM 9.5 03/03/2017 1359   PROT 6.9 03/03/2017 1359   ALBUMIN 4.4 03/03/2017 1359   AST 56 (H) 03/03/2017 1359   ALT 86 (H) 03/03/2017 1359   ALKPHOS 79 03/03/2017  1359   BILITOT 0.6 03/03/2017 1359   Hep C Ab neg 2016   Elevated liver function tests.   EXAM: ABDOMEN ULTRASOUND COMPLETE   COMPARISON:  None.   FINDINGS: Gallbladder: No gallstones or wall thickening visualized. No sonographic Murphy sign noted by sonographer.   Common bile duct: Diameter: 1.6 mm   Liver: Diffusely echogenic. Solid right lobe liver mass measuring 4.3 x 4.1 x 3.1 cm. Portal vein is patent on color Doppler imaging with normal direction of blood flow towards the liver.   IVC: No abnormality visualized.   Pancreas: Visualized portion unremarkable.   Spleen: Size and appearance within normal limits.   Right Kidney: Length: 12.1 cm. Echogenicity within normal limits. No mass or hydronephrosis visualized.   Left Kidney: Length: 10.8 cm. Echogenicity within normal limits. No mass or hydronephrosis visualized.   Abdominal aorta: Borderline dilated, measuring up to 3.0 cm in maximum diameter.   Other findings: None.   IMPRESSION: 1. 4.3 cm solid right lobe liver mass. This could represent a primary or metastatic neoplasm. Further evaluation with pre and postcontrast magnetic resonance imaging of the liver is recommended. 2. Diffusely echogenic liver. This is most likely due to steatosis. Chronic hepatitis and cirrhosis can also have this appearance. 3. 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 Recommend followup by ultrasound in 3 years. This recommendation follows ACR consensus guidelines: White Paper of the ACR Incidental Findings Committee II on Vascular Findings. Joellyn Rued Radiol 2013; [id number]     Electronically Signed   By: Claudie Revering M.D.   On: 03/26/2017 10:46   MRI ABDOMEN WITHOUT AND WITH CONTRAST   TECHNIQUE: Multiplanar multisequence MR imaging of the abdomen was performed both before and after the administration of intravenous contrast.   CONTRAST:  1mL MULTIHANCE GADOBENATE  DIMEGLUMINE 529 MG/ML IV SOLN   COMPARISON:  03/26/2017   FINDINGS: Lower chest: Unremarkable   Hepatobiliary: Diffuse hepatic steatosis.   Multiple small gallstones are subtle dependently in the gallbladder. No biliary dilatation.   Corresponding to the sonographic abnormality there is a 3.6 by 3.3 cm sharply defined T2 hyperintense, T1 hypointense lesion in the right hepatic lobe demonstrating peripheral nodular discontinuous centripetal enhancement highly characteristic of hepatic hemangioma. Less than half of this lesion is filled in on the 3 minutes images.   Pancreas:  Unremarkable   Spleen:  Unremarkable   Adrenals/Urinary Tract: Several tiny nonenhancing fluid signal intensity renal lesions at or below 5 mm in diameter are statistically likely to be cysts but technically nonspecific due to small size.   Stomach/Bowel: Unremarkable   Vascular/Lymphatic:  Unremarkable   Other:  No supplemental non-categorized findings.   Musculoskeletal: Mild lumbar degenerative disc disease.   IMPRESSION: 1. Diffuse hepatic steatosis. 2. 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 The only unusual feature of this lesion is the hypoechogenicity on  ultrasound, but this might be explained given the degree of fatty infiltration. The lesion did have enhanced through transmission on ultrasound, which is characteristic of hemangioma. This lesion requires no further workup. 3. Multiple tiny gallstones settle dependently in the gallbladder.     Electronically Signed   By: Van Clines M.D.   On: 04/10/2017 13:19        Assessment & Plan:  72 year old male with a past medical history of adenomatous colon polyps, diverticulosis, GERD, diabetes, hyperlipidemia who was seen in follow-up.   1. GERD --well controlled with pantoprazole.  He wishes to continue this medication given excellent response.  We will continue  Protonix 40 mg once daily.  Endoscopy was negative for Barrett's esophagus so no need for surveillance EGD.  2.  History of colon polyps --small adenoma 2016, repeat May 2021.  3. Fatty liver --we spent time today discussing fatty liver disease and he does have risk factors for this including hypertension, hyperlipidemia and diabetes.  We discussed how currently there are no great medications to improve liver enzymes and vitamin E has not been proven to help with fatty liver in patients with diabetes.  I recommend that he continue to work closely with Dr. Ronnald Ramp on tight glucose control.  I also recommended that he increase physical activity 3-5 days/week as this can improve fatty liver disease.  We discussed his ultrasound and MRI.  The hemangioma is benign and does not need surveillance.  His dependent, small gallstones, are asymptomatic. --I recommended we repeat hepatic function panel but also check hepatitis C antibody, hepatitis B status, ferritin, ANA, IgG, anti-smooth muscle and antimitochondrial antibody. --No evidence for advanced liver disease or cirrhosis.  I would like to see him back in 6 months for this issue  25 minutes spent with the patient today. Greater than 50% was spent in counseling and coordination of care with the patient

## 2017-04-30 ENCOUNTER — Telehealth: Payer: Self-pay

## 2017-04-30 DIAGNOSIS — E118 Type 2 diabetes mellitus with unspecified complications: Secondary | ICD-10-CM

## 2017-04-30 MED ORDER — GLUCOSE BLOOD VI STRP: ORAL_STRIP | 3 refills | Status: DC

## 2017-04-30 MED ORDER — BLOOD GLUCOSE MONITOR KIT: PACK | 0 refills | Status: DC

## 2017-04-30 MED ORDER — ACCU-CHEK SOFT TOUCH LANCETS MISC: 3 refills | Status: AC

## 2017-04-30 NOTE — Telephone Encounter (Signed)
[employer] sent rx request for glucometer, test strips and lancets.   These have been printed. PCP will return on Monday to sign orders.

## 2017-05-03 LAB — ANTI-SMOOTH MUSCLE ANTIBODY, IGG: Actin (Smooth Muscle) Antibody (IGG): <20 U (ref <20)

## 2017-05-03 LAB — IGG: IGG (IMMUNOGLOBIN G), SERUM: 922 mg/dL (ref 694–1618)

## 2017-05-03 LAB — HEPATITIS B SURFACE ANTIBODY,QUALITATIVE: HEP B S AB: BORDERLINE — AB

## 2017-05-03 LAB — ANA: Anti Nuclear Antibody(ANA): NEGATIVE

## 2017-05-03 LAB — MITOCHONDRIAL ANTIBODIES

## 2017-05-03 LAB — HEPATITIS B SURFACE ANTIGEN: Hepatitis B Surface Ag: NONREACTIVE

## 2017-05-03 LAB — HEPATITIS C ANTIBODY
Hepatitis C Ab: NONREACTIVE
SIGNAL TO CUT-OFF: 0.01 (ref <1.00)

## 2017-05-03 NOTE — Telephone Encounter (Signed)
rx has been faxed to [facility].

## 2017-07-01 ENCOUNTER — Other Ambulatory Visit: Payer: Self-pay | Admitting: Internal Medicine

## 2017-07-01 DIAGNOSIS — E785 Hyperlipidemia, unspecified: Secondary | ICD-10-CM

## 2017-07-07 ENCOUNTER — Other Ambulatory Visit: Payer: Self-pay | Admitting: Internal Medicine

## 2017-09-08 ENCOUNTER — Other Ambulatory Visit: Payer: Self-pay | Admitting: Internal Medicine

## 2017-10-05 ENCOUNTER — Other Ambulatory Visit: Payer: Self-pay | Admitting: Internal Medicine

## 2017-10-05 DIAGNOSIS — E118 Type 2 diabetes mellitus with unspecified complications: Secondary | ICD-10-CM

## 2017-10-14 ENCOUNTER — Encounter: Payer: Self-pay | Admitting: Internal Medicine

## 2017-10-14 ENCOUNTER — Ambulatory Visit (INDEPENDENT_AMBULATORY_CARE_PROVIDER_SITE_OTHER): Payer: Medicare HMO | Admitting: Internal Medicine

## 2017-10-14 ENCOUNTER — Other Ambulatory Visit (INDEPENDENT_AMBULATORY_CARE_PROVIDER_SITE_OTHER): Payer: Medicare HMO

## 2017-10-14 VITALS — BP 112/64 | HR 81 | Temp 97.8°F | Resp 16 | Wt 186.0 lb

## 2017-10-14 DIAGNOSIS — K7581 Nonalcoholic steatohepatitis (NASH): Secondary | ICD-10-CM | POA: Diagnosis not present

## 2017-10-14 DIAGNOSIS — E118 Type 2 diabetes mellitus with unspecified complications: Secondary | ICD-10-CM

## 2017-10-14 LAB — BASIC METABOLIC PANEL
BUN: 20 mg/dL (ref 6–23)
CALCIUM: 9.7 mg/dL (ref 8.4–10.5)
CO2: 29 meq/L (ref 19–32)
CREATININE: 0.99 mg/dL (ref 0.40–1.50)
Chloride: 102 mEq/L (ref 96–112)
GFR: 78.45 mL/min (ref >60.00)
Glucose, Bld: 181 mg/dL — ABNORMAL HIGH (ref 70–99)
Potassium: 4.8 mEq/L (ref 3.5–5.1)
SODIUM: 137 meq/L (ref 135–145)

## 2017-10-14 LAB — HEPATIC FUNCTION PANEL
ALBUMIN: 4.3 g/dL (ref 3.5–5.2)
ALK PHOS: 77 U/L (ref 39–117)
ALT: 71 U/L — AB (ref 0–53)
AST: 48 U/L — AB (ref 0–37)
Bilirubin, Direct: 0.2 mg/dL (ref 0.0–0.3)
TOTAL PROTEIN: 7.1 g/dL (ref 6.0–8.3)
Total Bilirubin: 0.7 mg/dL (ref 0.2–1.2)

## 2017-10-14 LAB — POCT GLYCOSYLATED HEMOGLOBIN (HGB A1C): HEMOGLOBIN A1C: 7.7 % — AB (ref 4.0–5.6)

## 2017-10-14 MED ORDER — EMPAGLIFLOZIN-METFORMIN HCL 5-1000 MG PO TABS
1.0000 | ORAL_TABLET | Freq: Two times a day (BID) | ORAL | 1 refills | Status: DC
Start: 2017-10-14 — End: 2018-06-22

## 2017-10-14 MED ORDER — METFORMIN HCL 1000 MG PO TABS: ORAL_TABLET | ORAL | 1 refills | Status: DC

## 2017-10-14 NOTE — Progress Notes (Signed)
Subjective:  Patient ID: Angel Rodgers, male    DOB: 24-Dec-1945  Age: 72 y.o. MRN: [id number]  CC: Hypertension and Diabetes   HPI Hai Grabe presents for f/up - He is concerned that his blood sugars are not adequately well controlled.  He is having blood fasting blood sugars in the morning as high as 120-130.  He denies polys.  He is compliant with the metformin.  Outpatient Medications Prior to Visit  Medication Sig Dispense Refill  . Alcohol Swabs (B-D SINGLE USE SWABS REGULAR) PADS Use as directed top check blood sugars dx E11.8 100 each 3  . Blood Glucose Calibration (ACCU-CHEK SMARTVIEW CONTROL) LIQD Use BID dx e11.8 3 each 3  . Blood Glucose Monitoring Suppl (ACCU-CHEK AVIVA PLUS) w/Device KIT USE AS DIRECTED 1 kit 1  . glucose blood (COOL BLOOD GLUCOSE TEST STRIPS) test strip Use to check blood sugar twice daily. DX: E11.9 200 each 3  . Lancets (ACCU-CHEK SOFT TOUCH) lancets Use to check blood sugar twice daily. DX: E11.9 200 each 3  . pantoprazole (PROTONIX) 40 MG tablet Take 1 tablet (40 mg total) by mouth daily. 90 tablet 1  . simvastatin (ZOCOR) 40 MG tablet Take 1 tablet (40 mg total) by mouth at bedtime. 90 tablet 1  . aspirin EC 81 MG tablet Take 1 tablet (81 mg total) by mouth daily. 90 tablet 3  . metFORMIN (GLUCOPHAGE) 1000 MG tablet TAKE 1 TABLET TWICE DAILY WITH MEALS  ( NEED MD APPOINTMENT FOR REFILLS  ) 180 tablet 1   No facility-administered medications prior to visit.     ROS Review of Systems  Constitutional: Negative for diaphoresis and fatigue.  HENT: Negative.   Eyes: Negative for visual disturbance.  Respiratory: Negative for cough, chest tightness, shortness of breath and wheezing.   Cardiovascular: Negative for chest pain, palpitations and leg swelling.  Gastrointestinal: Negative for abdominal pain, constipation, diarrhea, nausea and vomiting.  Endocrine: Negative.  Negative for polydipsia, polyphagia and polyuria.  Genitourinary: Negative.   Negative for difficulty urinating, dysuria, frequency and urgency.  Musculoskeletal: Negative for arthralgias and neck pain.  Skin: Negative.  Negative for color change and rash.  Neurological: Negative.  Negative for dizziness, weakness, light-headedness and headaches.  Hematological: Negative for adenopathy. Does not bruise/bleed easily.  Psychiatric/Behavioral: Negative.     Objective:  BP 112/64   Pulse 81   Temp 97.8 F (36.6 C) (Oral)   Resp 16   Wt 186 lb (84.4 kg)   SpO2 97%   BMI 25.94 kg/m   BP Readings from Last 3 Encounters:  10/14/17 112/64  04/29/17 114/66  03/03/17 110/70    Wt Readings from Last 3 Encounters:  10/14/17 186 lb (84.4 kg)  04/29/17 191 lb 2 oz (86.7 kg)  03/03/17 195 lb (88.5 kg)    Physical Exam  Constitutional: He is oriented to person, place, and time. No distress.  HENT:  Mouth/Throat: Oropharynx is clear and moist. No oropharyngeal exudate.  Eyes: Conjunctivae are normal. No scleral icterus.  Neck: Normal range of motion. Neck supple. No JVD present. No thyromegaly present.  Cardiovascular: Normal rate, regular rhythm and normal heart sounds. Exam reveals no gallop.  No murmur heard. Pulmonary/Chest: Effort normal and breath sounds normal. No respiratory distress. He has no wheezes. He has no rales.  Abdominal: Soft. Bowel sounds are normal. He exhibits no mass. There is no hepatosplenomegaly. There is no tenderness.  Musculoskeletal: Normal range of motion. He exhibits no edema, tenderness or deformity.  Lymphadenopathy:    He has no cervical adenopathy.  Neurological: He is alert and oriented to person, place, and time.  Skin: Skin is warm and dry. No rash noted. He is not diaphoretic.  Vitals reviewed.   Lab Results  Component Value Date   WBC 9.1 03/03/2017   HGB 15.3 03/03/2017   HCT 46.8 03/03/2017   PLT 164.0 03/03/2017   GLUCOSE 181 (H) 10/14/2017   CHOL 113 03/03/2017   TRIG 83.0 03/03/2017   HDL 44.20 03/03/2017    LDLDIRECT 169.8 02/21/2010   LDLCALC 52 03/03/2017   ALT 71 (H) 10/14/2017   AST 48 (H) 10/14/2017   NA 137 10/14/2017   K 4.8 10/14/2017   CL 102 10/14/2017   CREATININE 0.99 10/14/2017   BUN 20 10/14/2017   CO2 29 10/14/2017   TSH 2.12 03/03/2017   PSA 0.85 10/31/2014   HGBA1C 7.7 (A) 10/14/2017   MICROALBUR 0.7 03/03/2017    Mr Abdomen W Wo Contrast  Result Date: 04/10/2017 CLINICAL DATA:  Elevated liver enzymes. Hypoechoic right hepatic lobe mass on ultrasound with accentuated through transmission. EXAM: MRI ABDOMEN WITHOUT AND WITH CONTRAST TECHNIQUE: Multiplanar multisequence MR imaging of the abdomen was performed both before and after the administration of intravenous contrast. CONTRAST:  9m MULTIHANCE GADOBENATE DIMEGLUMINE 529 MG/ML IV SOLN COMPARISON:  03/26/2017 FINDINGS: Lower chest: Unremarkable Hepatobiliary: Diffuse hepatic steatosis. Multiple small gallstones are subtle dependently in the gallbladder. No biliary dilatation. Corresponding to the sonographic abnormality there is a 3.6 by 3.3 cm sharply defined T2 hyperintense, T1 hypointense lesion in the right hepatic lobe demonstrating peripheral nodular discontinuous centripetal enhancement highly characteristic of hepatic hemangioma. Less than half of this lesion is filled in on the 3 minutes images. Pancreas:  Unremarkable Spleen:  Unremarkable Adrenals/Urinary Tract: Several tiny nonenhancing fluid signal intensity renal lesions at or below 5 mm in diameter are statistically likely to be cysts but technically nonspecific due to small size. Stomach/Bowel: Unremarkable Vascular/Lymphatic:  Unremarkable Other:  No supplemental non-categorized findings. Musculoskeletal: Mild lumbar degenerative disc disease. IMPRESSION: 1. Diffuse hepatic steatosis. 2. 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 The only unusual feature of this lesion is the hypoechogenicity on ultrasound,  but this might be explained given the degree of fatty infiltration. The lesion did have enhanced through transmission on ultrasound, which is characteristic of hemangioma. This lesion requires no further workup. 3. Multiple tiny gallstones settle dependently in the gallbladder. Electronically Signed   By: WVan ClinesM.D.   On: 04/10/2017 13:19    Assessment & Plan:   LShaydewas seen today for hypertension and diabetes.  Diagnoses and all orders for this visit:  NASH (nonalcoholic steatohepatitis)- His LFTs remain very mildly elevated.  He is not symptomatic with this.  He was encouraged to continue working on his lifestyle modifications. -     Hepatic function panel; Future  Type 2 diabetes mellitus with complication, without long-term current use of insulin (HDover- His A1c is up to 7.7%.  His blood sugars are not adequately well controlled.  I will add an SGLT2 inhibitor to the metformin. -     Discontinue: metFORMIN (GLUCOPHAGE) 1000 MG tablet; TAKE 1 TABLET TWICE DAILY WITH MEALS -     POCT HgB A1C -     Empagliflozin-metFORMIN HCl (SYNJARDY) 06-998 MG TABS; Take 1 tablet by mouth 2 (two) times daily. -     Basic metabolic panel; Future -  HM Diabetes Foot Exam   I have discontinued Yecheskel Schlafer's aspirin EC, metFORMIN, and metFORMIN. I am also having him start on Empagliflozin-metFORMIN HCl. Additionally, I am having him maintain his B-D SINGLE USE SWABS REGULAR, ACCU-CHEK SMARTVIEW CONTROL, pantoprazole, glucose blood, accu-chek soft touch, simvastatin, and ACCU-CHEK AVIVA PLUS.  Meds ordered this encounter  Medications  . DISCONTD: metFORMIN (GLUCOPHAGE) 1000 MG tablet    Sig: TAKE 1 TABLET TWICE DAILY WITH MEALS    Dispense:  180 tablet    Refill:  1  . Empagliflozin-metFORMIN HCl (SYNJARDY) 06-998 MG TABS    Sig: Take 1 tablet by mouth 2 (two) times daily.    Dispense:  180 tablet    Refill:  1     Follow-up: Return in about 6 months (around 04/16/2018).  Scarlette Calico, MD

## 2017-10-14 NOTE — Patient Instructions (Signed)

## 2017-10-21 ENCOUNTER — Encounter: Payer: Self-pay | Admitting: Physician Assistant

## 2017-10-21 ENCOUNTER — Ambulatory Visit: Payer: Medicare HMO | Admitting: Physician Assistant

## 2017-10-21 VITALS — BP 120/60 | HR 68 | Ht 71.0 in | Wt 185.0 lb

## 2017-10-21 DIAGNOSIS — K7581 Nonalcoholic steatohepatitis (NASH): Secondary | ICD-10-CM | POA: Diagnosis not present

## 2017-10-21 MED ORDER — PANTOPRAZOLE SODIUM 40 MG PO TBEC: 40.0000 mg | DELAYED_RELEASE_TABLET | Freq: Every day | ORAL | 3 refills | Status: DC

## 2017-10-21 NOTE — Progress Notes (Addendum)
Subjective:    Patient ID: Angel Rodgers, male    DOB: [birth year], 72 y.o.   MRN: [id number]  HPI Angel Rodgers is a pleasant 72 year old white male, known to Dr. Hilarie Fredrickson who comes in today for routine follow-up of NASH. He was last seen in March 2019.Marland Kitchen  He last underwent abdominal ultrasound in February 2019 with finding of diffuse hepatic echogenicity, multiple tiny gallstones and a 3.6 cm lesion was noted in the right hepatic lobe.  Subsequent MRI showed the right hepatic lobe lesion to be a 3.6 cm benign hepatic hemangioma.  MRI also noted diffuse hepatic steatosis.  Patient had labs in March 2019 showing AST of 59, ALT of 90  repeat labs 10/13/2017 hemoglobin A1c 7.7 AST 48, ALT of 71.  Patient has no current complaints today.  He says he has been feeling well.  He has lost about 6 pounds since his last office visit current BMI now within normal limits.  He says he typically loses a little weight in the summer. 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 in the summer. He has not been consuming any alcohol on a regular basis, says very occasionally he will have a beer.  He has been taking a probiotic and B complex supplement and asked whether this was acceptable. He has been on baby aspirin for cardioprotective reasons.   He mentions that his PCP Dr. Ronnald Ramp has just switched him to a new medication for diabetes which she has not started yet, this is Syngardi.  Says his insurance is not wanting to pay for it and it is too expensive.   Review of Systems Pertinent positive and negative review of systems were noted in the above HPI section.  All other review of systems was otherwise negative.  Outpatient Encounter Medications as of 10/21/2017  Medication Sig  . Alcohol Swabs (B-D SINGLE USE SWABS REGULAR) PADS Use as directed top check blood sugars dx E11.8  . Blood Glucose Calibration (ACCU-CHEK SMARTVIEW CONTROL) LIQD Use BID dx e11.8  . Blood Glucose Monitoring  Suppl (ACCU-CHEK AVIVA PLUS) w/Device KIT USE AS DIRECTED  . glucose blood (COOL BLOOD GLUCOSE TEST STRIPS) test strip Use to check blood sugar twice daily. DX: E11.9  . Lancets (ACCU-CHEK SOFT TOUCH) lancets Use to check blood sugar twice daily. DX: E11.9  . pantoprazole (PROTONIX) 40 MG tablet Take 1 tablet (40 mg total) by mouth daily.  . simvastatin (ZOCOR) 40 MG tablet Take 1 tablet (40 mg total) by mouth at bedtime.  . [DISCONTINUED] pantoprazole (PROTONIX) 40 MG tablet Take 1 tablet (40 mg total) by mouth daily.  . Empagliflozin-metFORMIN HCl (SYNJARDY) 06-998 MG TABS Take 1 tablet by mouth 2 (two) times daily. (Patient not taking: Reported on 10/21/2017)   No facility-administered encounter medications on file as of 10/21/2017.    No Known Allergies Patient Active Problem List   Diagnosis Date Noted  . Liver mass, right lobe 03/26/2017  . NASH (nonalcoholic steatohepatitis) 03/03/2017  . Dysphagia 11/28/2015  . Routine health maintenance 08/03/2011  . Type 2 diabetes mellitus with manifestations ([place]) 02/21/2010  . Hyperlipidemia with target LDL less than 70 02/21/2010  . GERD 02/21/2010   Social History   Socioeconomic History  . Marital status: Married    Spouse name: Not on file  . Number of children: 2  . Years of education: 88  . Highest education level: Not on file  Occupational History  . Occupation: Geophysicist/field seismologist  Employer: RETIRED  Social Needs  . Financial resource strain: Not hard at all  . Food insecurity:    Worry: Never true    Inability: Never true  . Transportation needs:    Medical: No    Non-medical: No  Tobacco Use  . Smoking status: Former Smoker    Packs/day: 0.50    Years: 20.00    Pack years: 10.00    Types: Cigarettes, Pipe    Last attempt to quit: 02/17/1983    Years since quitting: 34.6  . Smokeless tobacco: Never Used  . Tobacco comment: Smoked until 83/ smoked pipe x 5 years  Substance and Sexual Activity  . Alcohol use: Yes     Alcohol/week: 0.0 standard drinks    Comment: occasional beer every week or 2  . Drug use: No  . Sexual activity: Yes  Lifestyle  . Physical activity:    Days per week: 0 days    Minutes per session: 0 min  . Stress: Not at all  Relationships  . Social connections:    Talks on phone: More than three times a week    Gets together: More than three times a week    Attends religious service: More than 4 times per year    Active member of club or organization: Not on file    Attends meetings of clubs or organizations: More than 4 times per year    Relationship status: Married  . Intimate partner violence:    Fear of current or ex partner: No    Emotionally abused: No    Physically abused: No    Forced sexual activity: No  Other Topics Concern  . Not on file  Social History Narrative   HSG, community college 2 years. [employer]-3 years, E5 at discharge. Married  '65. 1 son '70, 1 dtr '69; 2 grandchildren. 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      Objective:    Vitals:   10/21/17 0912  BP: 120/60  Pulse: 68    Physical Exam; well-developed older white male in no acute distress, very pleasant height 5 foot 11, weight 185, BMI 25.8.  HEENT ;nontraumatic normocephalic EOMI PERRLA sclera anicteric oral mucosa moist, Cardiovascular regular rate and rhythm with S1-S2 no murmur rub or gallop, Pulmonary; clear bilaterally, Abdomen ;soft, nontender nondistended bowel sounds are active there is no palpable mass or hepato-splenomegaly.  Rectal ;exam not done, Extremities; no clubbing cyanosis or edema skin warm dry, Neuro psych alert and oriented, grossly nonfocal mood and affect appropriate       Assessment & Plan:   #28 72 year old white  male who was seen today for follow-up of NASH. Has been doing well and is asymptomatic Weight is down about 6 pounds and BMI now normal Most recent transaminases slightly improved Hemoglobin A1c still not optimal  #2 adult onset diabetes mellitus #3.  Hyperlipidemia #4.  GERD stable #5.  History of adenomatous colon polyps-up-to-date with colonoscopy  Plan;; We discussed importance of maintaining a normal BMI, and some form of regular exercise.  He is encouraged to start walking 20 to 30 minutes several days a week. Alcohol has not been an issue, he is encouraged to continue minimal if any EtOH. There is new evidence that  baby aspirin may be beneficial for fatty liver disease.  He has  already been on this, and is therefore encouraged to continue Patient may benefit from pioglitazone in combination with metformin.  Pioglitazone has shown some benefit in patients with fatty liver disease.  We will communicate with his PCP Dr. Ronnald Ramp. Will plan office follow-up in 6 months with Dr. Hilarie Fredrickson.  Angel Rodgers S Samyia Motter PA-C 10/21/2017   Cc: Janith Lima, MD   Addendum: Reviewed and agree with initial management. Pyrtle, Lajuan Lines, MD

## 2017-10-21 NOTE — Patient Instructions (Signed)
Normal BMI (Body Mass Index- based on height and weight) is between 23 and 30. Your BMI today is Body mass index is 25.8 kg/m. Angel Rodgers Please consider follow up  regarding your BMI with your Primary Care Provider.  We sent refills to your pharmacy for the Pantoprazole sodium 40 mg.  Add walking for 30 minutes 3-4 times a week. Limit Alcohol intake.  Follow up with Dr. Hilarie Fredrickson in 6 months. You will get a letter to remind you to call our office.

## 2017-12-01 LAB — HM DIABETES EYE EXAM

## 2017-12-18 ENCOUNTER — Other Ambulatory Visit: Payer: Self-pay | Admitting: Internal Medicine

## 2017-12-18 DIAGNOSIS — E785 Hyperlipidemia, unspecified: Secondary | ICD-10-CM

## 2017-12-30 DIAGNOSIS — H5203 Hypermetropia, bilateral: Secondary | ICD-10-CM | POA: Diagnosis not present

## 2018-01-10 DIAGNOSIS — H2513 Age-related nuclear cataract, bilateral: Secondary | ICD-10-CM | POA: Diagnosis not present

## 2018-01-10 DIAGNOSIS — E119 Type 2 diabetes mellitus without complications: Secondary | ICD-10-CM | POA: Diagnosis not present

## 2018-01-24 ENCOUNTER — Other Ambulatory Visit: Payer: Self-pay | Admitting: Internal Medicine

## 2018-02-03 DIAGNOSIS — J0100 Acute maxillary sinusitis, unspecified: Secondary | ICD-10-CM | POA: Diagnosis not present

## 2018-02-23 ENCOUNTER — Ambulatory Visit: Payer: Medicare HMO

## 2018-03-31 ENCOUNTER — Other Ambulatory Visit: Payer: Self-pay | Admitting: Internal Medicine

## 2018-04-18 ENCOUNTER — Ambulatory Visit (INDEPENDENT_AMBULATORY_CARE_PROVIDER_SITE_OTHER): Payer: Medicare HMO | Admitting: *Deleted

## 2018-04-18 VITALS — BP 124/80 | HR 76 | Resp 17 | Ht 71.0 in | Wt 188.0 lb

## 2018-04-18 DIAGNOSIS — Z0000 Encounter for general adult medical examination without abnormal findings: Secondary | ICD-10-CM

## 2018-04-18 NOTE — Progress Notes (Addendum)
Subjective:   Angel Rodgers is a 73 y.o. male who presents for Medicare Annual/Subsequent preventive examination.  Review of Systems:  No ROS.  Medicare Wellness Visit. Additional risk factors are reflected in the social history.  Cardiac Risk Factors include: advanced age (>57mn, >>75women);diabetes mellitus;dyslipidemia;male gender Sleep patterns: feels rested on waking, gets up 1 times nightly to void and sleeps 7-8 hours nightly.    Home Safety/Smoke Alarms: Feels safe in home. Smoke alarms in place.  Living environment; residence and Firearm Safety: 1-story house/ trailer. Lives with wife, no needs for DME, good support system Seat Belt Safety/Bike Helmet: Wears seat belt.   PSA-  Lab Results  Component Value Date   PSA 0.85 10/31/2014   PSA 0.67 12/06/2012   PSA 0.57 02/21/2010       Objective:    Vitals: BP 124/80   Pulse 76   Resp 17   Ht _0  (1.803 m)   Wt 188 lb (85.3 kg)   SpO2 98%   BMI 26.22 kg/m   Body mass index is 26.22 kg/m.  Advanced Directives 04/18/2018 02/22/2017 03/31/2016 11/30/2015 11/05/2015 10/31/2014 10/25/2014  Does Patient Have a Medical Advance Directive? Yes Yes No No Yes Yes Yes  Type of AParamedicof [place] will [place] will - - - [place] will -  Does patient want to make changes to medical advance directive? - - - - - No - Patient declined -  Copy of [place] Chart? No - copy requested No - copy requested - - - Yes Yes  Would patient like information on creating a medical advance directive? - - - No - patient declined information - - -    Tobacco Social History   Tobacco Use  Smoking Status Former Smoker  . Packs/day: 0.50  . Years: 20.00  . Pack years: 10.00  . Types: Cigarettes, Pipe  . Last attempt to quit: 02/17/1983  . Years since quitting: 35.1  Smokeless Tobacco Never Used  Tobacco Comment   Smoked until 83/ smoked  pipe x 5 years     Counseling given: Not Answered Comment: Smoked until 83/ smoked pipe x 5 years  Past Medical History:  Diagnosis Date  . Diverticulosis   . DM (diabetes mellitus) ([place]   . Endocarditis, valve unspecified, unspecified cause   . Gallstones   . GERD (gastroesophageal reflux disease)   . Hepatic steatosis   . History of rheumatic fever   . HLD (hyperlipidemia)   . Liver mass   . Syncope and collapse   . Tubular adenoma of colon    Past Surgical History:  Procedure Laterality Date  . COLONOSCOPY    . ESOPHAGOGASTRODUODENOSCOPY    . FINGER SURGERY Right    cyst removed middle finger  . MOUTH SURGERY     Family History  Problem Relation Age of Onset  . Coronary artery disease Mother        AICD/PACER  . Hypertension Mother   . Diabetes Mother   . Hyperlipidemia Mother   . Coronary artery disease Father        AICD Pacer  . Cancer Father        ANAL  . Colon cancer Maternal Uncle   . Stomach cancer Other        materal great uncle and great aunt  . Prostate cancer Neg Hx    Social History   Socioeconomic History  . Marital  status: Married    Spouse name: Not on file  . Number of children: 2  . Years of education: 44  . Highest education level: Not on file  Occupational History  . Occupation: Education administrator: RETIRED  Social Needs  . Financial resource strain: Not hard at all  . Food insecurity:    Worry: Never true    Inability: Never true  . Transportation needs:    Medical: No    Non-medical: No  Tobacco Use  . Smoking status: Former Smoker    Packs/day: 0.50    Years: 20.00    Pack years: 10.00    Types: Cigarettes, Pipe    Last attempt to quit: 02/17/1983    Years since quitting: 35.1  . Smokeless tobacco: Never Used  . Tobacco comment: Smoked until 83/ smoked pipe x 5 years  Substance and Sexual Activity  . Alcohol use: Yes    Alcohol/week: 0.0 standard drinks    Comment: occasional beer every week or 2  . Drug use: No  .  Sexual activity: Yes  Lifestyle  . Physical activity:    Days per week: 0 days    Minutes per session: 0 min  . Stress: Not at all  Relationships  . Social connections:    Talks on phone: More than three times a week    Gets together: More than three times a week    Attends religious service: More than 4 times per year    Active member of club or organization: Yes    Attends meetings of clubs or organizations: More than 4 times per year    Relationship status: Married  Other Topics Concern  . Not on file  Social History Narrative   HSG, community college 2 years. [employer]-3 years, E5 at discharge. Married  '65. 1 son '70, 1 dtr '69; 2 grandchildren. 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 Alcohol Swabs (B-D SINGLE USE SWABS REGULAR) PADS Use as directed top check blood sugars dx E11.8  . Blood Glucose Calibration (ACCU-CHEK SMARTVIEW CONTROL) LIQD Use BID dx e11.8  . Blood Glucose Monitoring Suppl (ACCU-CHEK AVIVA PLUS) w/Device KIT USE AS DIRECTED  . glucose blood (COOL BLOOD GLUCOSE TEST STRIPS) test strip Use to check blood sugar twice daily. DX: E11.9  . Lancets (ACCU-CHEK SOFT TOUCH) lancets Use to check blood sugar twice daily. DX: E11.9  . metFORMIN (GLUCOPHAGE) 1000 MG tablet TAKE 1 TABLET TWICE DAILY WITH MEALS  . pantoprazole (PROTONIX) 40 MG tablet TAKE 1 TABLET EVERY DAY  . simvastatin (ZOCOR) 40 MG tablet TAKE 1 TABLET AT BEDTIME  . Empagliflozin-metFORMIN HCl (SYNJARDY) 06-998 MG TABS Take 1 tablet by mouth 2 (two) times daily. (Patient not taking: Reported on 10/21/2017)   No facility-administered encounter medications on file as of 04/18/2018.     Activities of Daily Living In your present state of health, do you have any difficulty performing the following activities: 04/18/2018  Hearing? N  Vision? N  Difficulty concentrating or making decisions? N  Walking or climbing stairs? N  Dressing or bathing?  N  Doing errands, shopping? N  Preparing Food and eating ? N  Using the Toilet? N  In the past six months, have you accidently leaked urine? N  Do you have problems with loss of bowel control? N  Managing your Medications? N  Managing your Finances? N  Housekeeping or managing your Housekeeping? N  Some recent data might be hidden    Patient Care Team: Janith Lima, MD as PCP - General (Internal Medicine) Dorrene German Shelda Altes., MD (Gastroenterology)   Assessment:   This is a routine wellness examination for Angel Rodgers. Physical assessment deferred to PCP.  Exercise Activities and Dietary recommendations Current Exercise Habits: The patient does not participate in regular exercise at present, Exercise limited by: None identified  Diet (meal preparation, eat out, water intake, caffeinated beverages, dairy products, fruits and vegetables): in general, a "healthy" diet  , well balanced   Reviewed heart healthy and diabetic diet. Encouraged patient to increase daily water and healthy fluid intake.  Goals      Patient Stated   . control sweets (pt-stated)     Will slow down eating sweets of which the wife will assist. Uses sugar free in cooking HFCS above;       Other   . Exercise 3x per week (30 min per time)     Walk 30 minutes three times per week    . Patient Stated     Get my sugar down by watching carbohydrates and sugar, join [employer].    . Patient Stated     Monitor my diet to keep my blood sugar lower.        Fall Risk Fall Risk  04/18/2018 03/03/2017 11/30/2015 11/05/2015 10/31/2014  Falls in the past year? 0 No No No No    Depression Screen PHQ 2/9 Scores 04/18/2018 03/03/2017 11/30/2015 11/05/2015  PHQ - 2 Score 0 0 0 0    Cognitive Function MMSE - Mini Mental State Exam 02/22/2017 11/05/2015 10/25/2014  Not completed: - (No Data) (No Data)  Orientation to time 5 - -  Orientation to Place 5 - -  Registration 3 - -  Attention/ Calculation 5 - -  Recall 2 - -    Language- name 2 objects 2 - -  Language- repeat 1 - -  Language- follow 3 step command 3 - -  Language- read & follow direction 1 - -  Write a sentence 1 - -  Copy design 1 - -  Total score 29 - -       Ad8 score reviewed for issues:  Issues making decisions: no  Less interest in hobbies / activities: no  Repeats questions, stories (family complaining): no  Trouble using ordinary gadgets (microwave, computer, phone):no  Forgets the month or year: no  Mismanaging finances: no  Remembering appts: no  Daily problems with thinking and/or memory: no Ad8 score is= 0  Immunization History  Administered Date(s) Administered  . Pneumococcal Conjugate-13 08/21/2013  . Pneumococcal Polysaccharide-23 10/25/2012  . Td 03/07/2010   Screening Tests Health Maintenance  Topic Date Due  . OPHTHALMOLOGY EXAM  10/17/2017  . URINE MICROALBUMIN  03/03/2018  . HEMOGLOBIN A1C  04/16/2018  . INFLUENZA VACCINE  06/03/2018 (Originally 09/16/2017)  . FOOT EXAM  10/15/2018  . TETANUS/TDAP  03/07/2020  . COLONOSCOPY  07/04/2024  . Hepatitis C Screening  Completed  . PNA vac Low Risk Adult  Completed       Plan:    Reviewed health maintenance screenings with patient today and relevant education, vaccines, and/or referrals were provided.   Continue doing brain stimulating activities (puzzles, reading, adult coloring books, staying active) to keep memory sharp.   Continue to eat heart healthy diet (full of fruits, vegetables, whole grains, lean protein, water--limit salt, fat, and sugar intake) and increase physical activity as tolerated.  I  have personally reviewed and noted the following in the patient's chart:   . Medical and social history . Use of alcohol, tobacco or illicit drugs  . Current medications and supplements . Functional ability and status . Nutritional status . Physical activity . Advanced directives . List of other physicians . Vitals . Screenings to include  cognitive, depression, and falls . Referrals and appointments  In addition, I have reviewed and discussed with patient certain preventive protocols, quality metrics, and best practice recommendations. A written personalized care plan for preventive services as well as general preventive health recommendations were provided to patient.     Michiel Cowboy, RN  04/18/2018   Medical screening examination/treatment/procedure(s) were performed by non-physician practitioner and as supervising physician I was immediately available for consultation/collaboration. I agree with above. Scarlette Calico, MD

## 2018-04-18 NOTE — Patient Instructions (Addendum)
Continue doing brain stimulating activities (puzzles, reading, adult coloring books, staying active) to keep memory sharp.   Continue to eat heart healthy diet (full of fruits, vegetables, whole grains, lean protein, water--limit salt, fat, and sugar intake) and increase physical activity as tolerated.   Angel Rodgers , Thank you for taking time to come for your Medicare Wellness Visit. I appreciate your ongoing commitment to your health goals. Please review the following plan we discussed and let me know if I can assist you in the future.   These are the goals we discussed: Goals      Patient Stated   . control sweets (pt-stated)     Will slow down eating sweets of which the wife will assist. Uses sugar free in cooking HFCS above;       Other   . Exercise 3x per week (30 min per time)     Walk 30 minutes three times per week    . Patient Stated     Get my sugar down by watching carbohydrates and sugar, join [employer].    . Patient Stated     Monitor my diet to keep my blood sugar lower.        This is a list of the screening recommended for you and due dates:  Health Maintenance  Topic Date Due  . Eye exam for diabetics  10/17/2017  . Urine Protein Check  03/03/2018  . Hemoglobin A1C  04/16/2018  . Flu Shot  06/03/2018*  . Complete foot exam   10/15/2018  . Tetanus Vaccine  03/07/2020  . Colon Cancer Screening  07/04/2024  .  Hepatitis C: One time screening is recommended by Center for Disease Control  (CDC) for  adults born from [birth year] through 1967.   Completed  . Pneumonia vaccines  Completed  *Topic was postponed. The date shown is not the original due date.      Preventive Care 2 Years and Older, Male Preventive care refers to lifestyle choices and visits with your health care provider that can promote health and wellness. What does preventive care include?   A yearly physical exam. This is also called an annual well check.  Dental exams once or twice a  year.  Routine eye exams. Ask your health care provider how often you should have your eyes checked.  Personal lifestyle choices, including: ? Daily care of your teeth and gums. ? Regular physical activity. ? Eating a healthy diet. ? Avoiding tobacco and drug use. ? Limiting alcohol use. ? Practicing safe sex. ? Taking low doses of aspirin every day. ? Taking vitamin and mineral supplements as recommended by your health care provider. What happens during an annual well check? The services and screenings done by your health care provider during your annual well check will depend on your age, overall health, lifestyle risk factors, and family history of disease. Counseling Your health care provider may ask you questions about your:  Alcohol use.  Tobacco use.  Drug use.  Emotional well-being.  Home and relationship well-being.  Sexual activity.  Eating habits.  History of falls.  Memory and ability to understand (cognition).  Work and work Statistician. Screening You may have the following tests or measurements:  Height, weight, and BMI.  Blood pressure.  Lipid and cholesterol levels. These may be checked every 5 years, or more frequently if you are over 52 years old.  Skin check.  Lung cancer screening. You may have this screening every year starting at  age 42 if you have a 30-pack-year history of smoking and currently smoke or have quit within the past 15 years.  Colorectal cancer screening. All adults should have this screening starting at age 21 and continuing until age 54. You will have tests every 1-10 years, depending on your results and the type of screening test. People at increased risk should start screening at an earlier age. Screening tests may include: ? Guaiac-based fecal occult blood testing. ? Fecal immunochemical test (FIT). ? Stool DNA test. ? Virtual colonoscopy. ? Sigmoidoscopy. During this test, a flexible tube with a tiny camera  (sigmoidoscope) is used to examine your rectum and lower colon. The sigmoidoscope is inserted through your anus into your rectum and lower colon. ? Colonoscopy. During this test, a long, thin, flexible tube with a tiny camera (colonoscope) is used to examine your entire colon and rectum.  Prostate cancer screening. Recommendations will vary depending on your family history and other risks.  Hepatitis C blood test.  Hepatitis B blood test.  Sexually transmitted disease (STD) testing.  Diabetes screening. This is done by checking your blood sugar (glucose) after you have not eaten for a while (fasting). You may have this done every 1-3 years.  Abdominal aortic aneurysm (AAA) screening. You may need this if you are a current or former smoker.  Osteoporosis. You may be screened starting at age 26 if you are at high risk. Talk with your health care provider about your test results, treatment options, and if necessary, the need for more tests. Vaccines Your health care provider may recommend certain vaccines, such as:  Influenza vaccine. This is recommended every year.  Tetanus, diphtheria, and acellular pertussis (Tdap, Td) vaccine. You may need a Td booster every 10 years.  Varicella vaccine. You may need this if you have not been vaccinated.  Zoster vaccine. You may need this after age 55.  Measles, mumps, and rubella (MMR) vaccine. You may need at least one dose of MMR if you were born in 1957 or later. You may also need a second dose.  Pneumococcal 13-valent conjugate (PCV13) vaccine. One dose is recommended after age 24.  Pneumococcal polysaccharide (PPSV23) vaccine. One dose is recommended after age 23.  Meningococcal vaccine. You may need this if you have certain conditions.  Hepatitis A vaccine. You may need this if you have certain conditions or if you travel or work in places where you may be exposed to hepatitis A.  Hepatitis B vaccine. You may need this if you have  certain conditions or if you travel or work in places where you may be exposed to hepatitis B.  Haemophilus influenzae type b (Hib) vaccine. You may need this if you have certain risk factors. Talk to your health care provider about which screenings and vaccines you need and how often you need them. This information is not intended to replace advice given to you by your health care provider. Make sure you discuss any questions you have with your health care provider. Document Released: 03/01/2015 Document Revised: 03/25/2017 Document Reviewed: 12/04/2014 Elsevier Interactive Patient Education  2019 [employer].

## 2018-05-01 ENCOUNTER — Encounter: Payer: Self-pay | Admitting: Internal Medicine

## 2018-05-09 ENCOUNTER — Telehealth: Payer: Self-pay | Admitting: Internal Medicine

## 2018-05-09 MED ORDER — PANTOPRAZOLE SODIUM 40 MG PO TBEC: 40.0000 mg | DELAYED_RELEASE_TABLET | Freq: Every day | ORAL | 0 refills | Status: DC

## 2018-05-09 NOTE — Telephone Encounter (Signed)
Rx sent 

## 2018-05-31 ENCOUNTER — Other Ambulatory Visit: Payer: Self-pay | Admitting: Internal Medicine

## 2018-06-22 ENCOUNTER — Encounter: Payer: Self-pay | Admitting: *Deleted

## 2018-06-22 ENCOUNTER — Other Ambulatory Visit: Payer: Self-pay | Admitting: Internal Medicine

## 2018-06-22 ENCOUNTER — Ambulatory Visit (INDEPENDENT_AMBULATORY_CARE_PROVIDER_SITE_OTHER): Payer: Medicare HMO | Admitting: Internal Medicine

## 2018-06-22 ENCOUNTER — Other Ambulatory Visit (INDEPENDENT_AMBULATORY_CARE_PROVIDER_SITE_OTHER): Payer: Medicare HMO

## 2018-06-22 ENCOUNTER — Telehealth: Payer: Self-pay | Admitting: Internal Medicine

## 2018-06-22 ENCOUNTER — Other Ambulatory Visit: Payer: Self-pay

## 2018-06-22 ENCOUNTER — Encounter: Payer: Self-pay | Admitting: Internal Medicine

## 2018-06-22 VITALS — BP 130/70 | HR 63 | Temp 98.0°F | Resp 16 | Ht 71.0 in | Wt 184.0 lb

## 2018-06-22 DIAGNOSIS — N400 Enlarged prostate without lower urinary tract symptoms: Secondary | ICD-10-CM | POA: Insufficient documentation

## 2018-06-22 DIAGNOSIS — E785 Hyperlipidemia, unspecified: Secondary | ICD-10-CM | POA: Diagnosis not present

## 2018-06-22 DIAGNOSIS — E118 Type 2 diabetes mellitus with unspecified complications: Secondary | ICD-10-CM | POA: Diagnosis not present

## 2018-06-22 DIAGNOSIS — M67432 Ganglion, left wrist: Secondary | ICD-10-CM | POA: Diagnosis not present

## 2018-06-22 DIAGNOSIS — K219 Gastro-esophageal reflux disease without esophagitis: Secondary | ICD-10-CM | POA: Diagnosis not present

## 2018-06-22 DIAGNOSIS — K7581 Nonalcoholic steatohepatitis (NASH): Secondary | ICD-10-CM | POA: Diagnosis not present

## 2018-06-22 DIAGNOSIS — Z0000 Encounter for general adult medical examination without abnormal findings: Secondary | ICD-10-CM

## 2018-06-22 DIAGNOSIS — R160 Hepatomegaly, not elsewhere classified: Secondary | ICD-10-CM

## 2018-06-22 LAB — URINALYSIS, ROUTINE W REFLEX MICROSCOPIC
Bilirubin Urine: NEGATIVE
Hgb urine dipstick: NEGATIVE
Ketones, ur: NEGATIVE
Leukocytes,Ua: NEGATIVE
Nitrite: NEGATIVE
RBC / HPF: NONE SEEN (ref 0–?)
Specific Gravity, Urine: 1.025 (ref 1.000–1.030)
Total Protein, Urine: NEGATIVE
Urine Glucose: NEGATIVE
Urobilinogen, UA: 1 (ref 0.0–1.0)
WBC, UA: NONE SEEN (ref 0–?)
pH: 6 (ref 5.0–8.0)

## 2018-06-22 LAB — CBC WITH DIFFERENTIAL/PLATELET
Basophils Absolute: 0.1 10*3/uL (ref 0.0–0.1)
Basophils Relative: 0.8 % (ref 0.0–3.0)
Eosinophils Absolute: 0.4 10*3/uL (ref 0.0–0.7)
Eosinophils Relative: 4.6 % (ref 0.0–5.0)
HCT: 43.2 % (ref 39.0–52.0)
Hemoglobin: 14.4 g/dL (ref 13.0–17.0)
Lymphocytes Relative: 29 % (ref 12.0–46.0)
Lymphs Abs: 2.2 10*3/uL (ref 0.7–4.0)
MCHC: 33.4 g/dL (ref 30.0–36.0)
MCV: 91.6 fl (ref 78.0–100.0)
Monocytes Absolute: 0.8 10*3/uL (ref 0.1–1.0)
Monocytes Relative: 9.9 % (ref 3.0–12.0)
Neutro Abs: 4.3 10*3/uL (ref 1.4–7.7)
Neutrophils Relative %: 55.7 % (ref 43.0–77.0)
Platelets: 145 10*3/uL — ABNORMAL LOW (ref 150.0–400.0)
RBC: 4.71 Mil/uL (ref 4.22–5.81)
RDW: 13.8 % (ref 11.5–15.5)
WBC: 7.7 10*3/uL (ref 4.0–10.5)

## 2018-06-22 LAB — BASIC METABOLIC PANEL
BUN: 20 mg/dL (ref 6–23)
CO2: 27 mEq/L (ref 19–32)
Calcium: 8.8 mg/dL (ref 8.4–10.5)
Chloride: 104 mEq/L (ref 96–112)
Creatinine, Ser: 0.86 mg/dL (ref 0.40–1.50)
GFR: 86.66 mL/min (ref >60.00)
Glucose, Bld: 148 mg/dL — ABNORMAL HIGH (ref 70–99)
Potassium: 4.7 mEq/L (ref 3.5–5.1)
Sodium: 139 mEq/L (ref 135–145)

## 2018-06-22 LAB — HEPATIC FUNCTION PANEL
ALT: 58 U/L — ABNORMAL HIGH (ref 0–53)
AST: 36 U/L (ref 0–37)
Albumin: 4.4 g/dL (ref 3.5–5.2)
Alkaline Phosphatase: 90 U/L (ref 39–117)
Bilirubin, Direct: 0.2 mg/dL (ref 0.0–0.3)
Total Bilirubin: 0.8 mg/dL (ref 0.2–1.2)
Total Protein: 6.7 g/dL (ref 6.0–8.3)

## 2018-06-22 LAB — HEMOGLOBIN A1C: Hgb A1c MFr Bld: 8.4 % — ABNORMAL HIGH (ref 4.6–6.5)

## 2018-06-22 LAB — MICROALBUMIN / CREATININE URINE RATIO
Creatinine,U: 112.7 mg/dL
Microalb Creat Ratio: 0.7 mg/g (ref 0.0–30.0)
Microalb, Ur: 0.8 mg/dL (ref 0.0–1.9)

## 2018-06-22 LAB — LIPID PANEL
Cholesterol: 101 mg/dL (ref 0–200)
HDL: 39.5 mg/dL (ref >39.00)
LDL Cholesterol: 52 mg/dL (ref 0–99)
NonHDL: 61.8
Total CHOL/HDL Ratio: 3
Triglycerides: 47 mg/dL (ref 0.0–149.0)
VLDL: 9.4 mg/dL (ref 0.0–40.0)

## 2018-06-22 LAB — PSA: PSA: 0.46 ng/mL (ref 0.10–4.00)

## 2018-06-22 LAB — TSH: TSH: 1.65 u[IU]/mL (ref 0.35–4.50)

## 2018-06-22 MED ORDER — INSULIN PEN NEEDLE 32G X 6 MM MISC: 1.0000 | Freq: Every day | 3 refills | Status: DC

## 2018-06-22 MED ORDER — INSULIN GLARGINE (2 UNIT DIAL) 300 UNIT/ML ~~LOC~~ SOPN: 30.0000 [IU] | PEN_INJECTOR | Freq: Every day | SUBCUTANEOUS | 1 refills | Status: DC

## 2018-06-22 MED ORDER — METFORMIN HCL 1000 MG PO TABS: 1000.0000 mg | ORAL_TABLET | Freq: Two times a day (BID) | ORAL | 1 refills | Status: DC

## 2018-06-22 MED ORDER — PIOGLITAZONE HCL 15 MG PO TABS: 15.0000 mg | ORAL_TABLET | Freq: Every day | ORAL | 1 refills | Status: DC

## 2018-06-22 NOTE — Progress Notes (Signed)
Subjective:  Patient ID: Angel Rodgers, male    DOB: 1945-11-07  Age: 73 y.o. MRN: [id number]  CC: Hyperlipidemia; Gastroesophageal Reflux; and Diabetes   HPI Angel Rodgers presents for f/up - He complains of a painful lesion on the volar side of his left wrist.  It has been growing over the last 8 months.  He does not monitor his blood sugar but he denies polys.  He tells me he thinks his blood pressure has been well controlled.  He denies any recent episodes of CP, DOE, palpitations, edema, or fatigue.  Outpatient Medications Prior to Visit  Medication Sig Dispense Refill  . Alcohol Swabs (B-D SINGLE USE SWABS REGULAR) PADS Use as directed top check blood sugars dx E11.8 100 each 3  . Blood Glucose Calibration (ACCU-CHEK SMARTVIEW CONTROL) LIQD Use BID dx e11.8 3 each 3  . Blood Glucose Monitoring Suppl (ACCU-CHEK AVIVA PLUS) w/Device KIT USE AS DIRECTED 1 kit 2  . glucose blood (COOL BLOOD GLUCOSE TEST STRIPS) test strip Use to check blood sugar twice daily. DX: E11.9 200 each 3  . Lancets (ACCU-CHEK SOFT TOUCH) lancets Use to check blood sugar twice daily. DX: E11.9 200 each 3  . metFORMIN (GLUCOPHAGE) 1000 MG tablet TAKE 1 TABLET TWICE DAILY WITH MEALS 60 tablet 0  . pantoprazole (PROTONIX) 40 MG tablet Take 1 tablet (40 mg total) by mouth daily. 90 tablet 0  . simvastatin (ZOCOR) 40 MG tablet TAKE 1 TABLET AT BEDTIME 90 tablet 1  . Empagliflozin-metFORMIN HCl (SYNJARDY) 06-998 MG TABS Take 1 tablet by mouth 2 (two) times daily. (Patient not taking: Reported on 10/21/2017) 180 tablet 1   No facility-administered medications prior to visit.     ROS Review of Systems  Constitutional: Negative.  Negative for diaphoresis and fatigue.  HENT: Negative.  Negative for trouble swallowing.   Eyes: Negative for visual disturbance.  Respiratory: Negative for chest tightness, shortness of breath and wheezing.   Cardiovascular: Negative for chest pain, palpitations and leg swelling.   Gastrointestinal: Negative for abdominal pain, constipation, diarrhea, nausea and vomiting.  Endocrine: Negative.  Negative for polydipsia, polyphagia and polyuria.  Genitourinary: Negative.  Negative for difficulty urinating, scrotal swelling, testicular pain and urgency.  Musculoskeletal: Positive for arthralgias. Negative for myalgias.  Skin: Negative.   Neurological: Negative.  Negative for dizziness, weakness and light-headedness.  Hematological: Negative for adenopathy. Does not bruise/bleed easily.  Psychiatric/Behavioral: Negative.     Objective:  BP 130/70 (BP Location: Left Arm, Patient Position: Sitting, Cuff Size: Normal)   Pulse 63   Temp 98 F (36.7 C) (Oral)   Resp 16   Ht '5\' 11"'$  (1.803 m)   Wt 184 lb (83.5 kg)   SpO2 97%   BMI 25.66 kg/m   BP Readings from Last 3 Encounters:  06/22/18 130/70  04/18/18 124/80  10/21/17 120/60    Wt Readings from Last 3 Encounters:  06/22/18 184 lb (83.5 kg)  04/18/18 188 lb (85.3 kg)  10/21/17 185 lb (83.9 kg)    Physical Exam Vitals signs reviewed.  Constitutional:      Appearance: He is not ill-appearing or diaphoretic.  HENT:     Nose: Nose normal.     Mouth/Throat:     Mouth: Mucous membranes are moist.     Pharynx: No oropharyngeal exudate.  Eyes:     General: No scleral icterus.    Conjunctiva/sclera: Conjunctivae normal.  Neck:     Musculoskeletal: Normal range of motion. No muscular tenderness.  Cardiovascular:  Rate and Rhythm: Normal rate and regular rhythm.     Heart sounds: No murmur. No gallop.   Pulmonary:     Effort: Pulmonary effort is normal.     Breath sounds: No stridor. No wheezing, rhonchi or rales.  Abdominal:     General: Abdomen is flat.     Palpations: There is no hepatomegaly, splenomegaly or mass.     Tenderness: There is no abdominal tenderness.     Hernia: There is no hernia in the right inguinal area or left inguinal area.  Genitourinary:    Pubic Area: No rash.      Penis:  Uncircumcised. No phimosis, paraphimosis, hypospadias, erythema, tenderness, discharge, swelling or lesions.      Scrotum/Testes: Normal.        Right: Mass not present.        Left: Mass not present.     Epididymis:     Right: Normal.     Left: Normal.     Prostate: Enlarged. Not tender and no nodules present.     Rectum: Normal. Guaiac result negative. No mass, tenderness, anal fissure, external hemorrhoid or internal hemorrhoid. Normal anal tone.  Musculoskeletal: Normal range of motion.        General: No swelling.       Arms:     Right lower leg: No edema.     Left lower leg: No edema.  Lymphadenopathy:     Cervical: No cervical adenopathy.     Lower Body: No right inguinal adenopathy. No left inguinal adenopathy.  Skin:    General: Skin is warm and dry.  Neurological:     General: No focal deficit present.  Psychiatric:        Mood and Affect: Mood normal.        Behavior: Behavior normal.     Lab Results  Component Value Date   WBC 7.7 06/22/2018   HGB 14.4 06/22/2018   HCT 43.2 06/22/2018   PLT 145.0 (L) 06/22/2018   GLUCOSE 148 (H) 06/22/2018   CHOL 101 06/22/2018   TRIG 47.0 06/22/2018   HDL 39.50 06/22/2018   LDLDIRECT 169.8 02/21/2010   LDLCALC 52 06/22/2018   ALT 58 (H) 06/22/2018   AST 36 06/22/2018   NA 139 06/22/2018   K 4.7 06/22/2018   CL 104 06/22/2018   CREATININE 0.86 06/22/2018   BUN 20 06/22/2018   CO2 27 06/22/2018   TSH 1.65 06/22/2018   PSA 0.46 06/22/2018   HGBA1C 8.4 (H) 06/22/2018   MICROALBUR 0.8 06/22/2018    Mr Abdomen W Wo Contrast  Result Date: 04/10/2017 CLINICAL DATA:  Elevated liver enzymes. Hypoechoic right hepatic lobe mass on ultrasound with accentuated through transmission. EXAM: MRI ABDOMEN WITHOUT AND WITH CONTRAST TECHNIQUE: Multiplanar multisequence MR imaging of the abdomen was performed both before and after the administration of intravenous contrast. CONTRAST:  58m MULTIHANCE GADOBENATE DIMEGLUMINE 529 MG/ML IV  SOLN COMPARISON:  03/26/2017 FINDINGS: Lower chest: Unremarkable Hepatobiliary: Diffuse hepatic steatosis. Multiple small gallstones are subtle dependently in the gallbladder. No biliary dilatation. Corresponding to the sonographic abnormality there is a 3.6 by 3.3 cm sharply defined T2 hyperintense, T1 hypointense lesion in the right hepatic lobe demonstrating peripheral nodular discontinuous centripetal enhancement highly characteristic of hepatic hemangioma. Less than half of this lesion is filled in on the 3 minutes images. Pancreas:  Unremarkable Spleen:  Unremarkable Adrenals/Urinary Tract: Several tiny nonenhancing fluid signal intensity renal lesions at or below 5 mm in diameter are statistically likely to  be cysts but technically nonspecific due to small size. Stomach/Bowel: Unremarkable Vascular/Lymphatic:  Unremarkable Other:  No supplemental non-categorized findings. Musculoskeletal: Mild lumbar degenerative disc disease. IMPRESSION: 1. Diffuse hepatic steatosis. 2. 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 The only unusual feature of this lesion is the hypoechogenicity on ultrasound, but this might be explained given the degree of fatty infiltration. The lesion did have enhanced through transmission on ultrasound, which is characteristic of hemangioma. This lesion requires no further workup. 3. Multiple tiny gallstones settle dependently in the gallbladder. Electronically Signed   By: Van Clines M.D.   On: 04/10/2017 13:19    Assessment & Plan:   Jayvion was seen today for hyperlipidemia, gastroesophageal reflux and diabetes.  Diagnoses and all orders for this visit:  NASH (nonalcoholic steatohepatitis)- His enzymes are better but remain mildly elevated.  I recommended that he start taking pioglitazone to reduce the risk of complications from nonalcoholic steatohepatitis. -     Hepatic function panel; Future  Type 2 diabetes  mellitus with manifestations ([place])- His A1c is up to 8.4%.  It looks like he is not taking the SGLT2 inhibitor.  Will continue metformin at the current dose.  I have also asked him to add pioglitazone and basal insulin. -     Microalbumin / creatinine urine ratio; Future -     Basic metabolic panel; Future -     Hemoglobin A1c; Future -     Urinalysis, Routine w reflex microscopic; Future -     Insulin Glargine, 2 Unit Dial, (TOUJEO MAX SOLOSTAR) 300 UNIT/ML SOPN; Inject 30 Units into the skin daily. -     Insulin Pen Needle (NOVOFINE) 32G X 6 MM MISC; 1 Act by Does not apply route daily. Use QD with insulin  Liver mass, right lobe- His liver enzymes are improving.  An MRI of this was consistent with a hemangioma.  This is a benign lesion. -     Hepatic function panel; Future  Gastroesophageal reflux disease without esophagitis- His symptoms are adequately well controlled. -     CBC with Differential/Platelet; Future  Routine health maintenance  Hyperlipidemia with target LDL less than 70- He has achieved his LDL goal and is doing well on the statin. -     Lipid panel; Future -     TSH; Future  Benign prostatic hyperplasia without lower urinary tract symptoms- His PSA is normal which is reassuring that he does not have prostate cancer.  He has no symptoms that need to be treated. -     PSA; Future -     Urinalysis, Routine w reflex microscopic; Future  Ganglion cyst of volar aspect of left wrist -     Ambulatory referral to Orthopedic Surgery   I am having Honor Junes start on Insulin Glargine (2 Unit Dial) and Insulin Pen Needle. I am also having him maintain his B-D SINGLE USE SWABS REGULAR, Accu-Chek SmartView Control, glucose blood, accu-chek soft touch, Empagliflozin-metFORMIN HCl, simvastatin, Accu-Chek Aviva Plus, pantoprazole, and metFORMIN.  Meds ordered this encounter  Medications  . Insulin Glargine, 2 Unit Dial, (TOUJEO MAX SOLOSTAR) 300 UNIT/ML SOPN    Sig: Inject 30  Units into the skin daily.    Dispense:  9 mL    Refill:  1  . Insulin Pen Needle (NOVOFINE) 32G X 6 MM MISC    Sig: 1 Act by Does not apply route daily. Use QD with insulin  Dispense:  100 each    Refill:  3     Follow-up: Return in about 6 months (around 12/23/2018).  Scarlette Calico, MD

## 2018-06-22 NOTE — Telephone Encounter (Signed)
Spoke with patient. He is overdue for follow up of his NASH was to have 6 month follow up. We will see him tomorrow and will go over all meds then. He verbalizes understanding.

## 2018-06-22 NOTE — Patient Instructions (Signed)
Type 2 Diabetes Mellitus, Diagnosis, Adult Type 2 diabetes (type 2 diabetes mellitus) is a long-term (chronic) disease. In type 2 diabetes, one or both of these problems may be present:  The pancreas does not make enough of a hormone called insulin.  Cells in the body do not respond properly to insulin that the body makes (insulin resistance). Normally, insulin allows blood sugar (glucose) to enter cells in the body. The cells use glucose for energy. Insulin resistance or lack of insulin causes excess glucose to build up in the blood instead of going into cells. As a result, high blood glucose (hyperglycemia) develops. What increases the risk? The following factors may make you more likely to develop type 2 diabetes:  Having a family member with type 2 diabetes.  Being overweight or obese.  Having an inactive (sedentary) lifestyle.  Having been diagnosed with insulin resistance.  Having a history of prediabetes, gestational diabetes, or polycystic ovary syndrome (PCOS).  Being of American-Indian, African-American, Hispanic/Latino, or Asian/Pacific Islander descent. What are the signs or symptoms? In the early stage of this condition, you may not have symptoms. Symptoms develop slowly and may include:  Increased thirst (polydipsia).  Increased hunger(polyphagia).  Increased urination (polyuria).  Increased urination during the night (nocturia).  Unexplained weight loss.  Frequent infections that keep coming back (recurring).  Fatigue.  Weakness.  Vision changes, such as blurry vision.  Cuts or bruises that are slow to heal.  Tingling or numbness in the hands or feet.  Dark patches on the skin (acanthosis nigricans). How is this diagnosed? This condition is diagnosed based on your symptoms, your medical history, a physical exam, and your blood glucose level. Your blood glucose may be checked with one or more of the following blood tests:  A fasting blood glucose (FBG)  test. You will not be allowed to eat (you will fast) for 8 hours or longer before a blood sample is taken.  A random blood glucose test. This test checks blood glucose at any time of day regardless of when you ate.  An A1c (hemoglobin A1c) blood test. This test provides information about blood glucose control over the previous 2-3 months.  An oral glucose tolerance test (OGTT). This test measures your blood glucose at two times: ? After fasting. This is your baseline blood glucose level. ? Two hours after drinking a beverage that contains glucose. You may be diagnosed with type 2 diabetes if:  Your FBG level is 126 mg/dL (7.0 mmol/L) or higher.  Your random blood glucose level is 200 mg/dL (11.1 mmol/L) or higher.  Your A1c level is 6.5% or higher.  Your OGTT result is higher than 200 mg/dL (11.1 mmol/L). These blood tests may be repeated to confirm your diagnosis. How is this treated? Your treatment may be managed by a specialist called an endocrinologist. Type 2 diabetes may be treated by following instructions from your health care provider about:  Making diet and lifestyle changes. This may include: ? Following an individualized nutrition plan that is developed by a diet and nutrition specialist (registered dietitian). ? Exercising regularly. ? Finding ways to manage stress.  Checking your blood glucose level as often as told.  Taking diabetes medicines or insulin daily. This helps to keep your blood glucose levels in the healthy range. ? If you use insulin, you may need to adjust the dosage depending on how physically active you are and what foods you eat. Your health care provider will tell you how to adjust your dosage.    Taking medicines to help prevent complications from diabetes, such as: ? Aspirin. ? Medicine to lower cholesterol. ? Medicine to control blood pressure. Your health care provider will set individualized treatment goals for you. Your goals will be based on  your age, other medical conditions you have, and how you respond to diabetes treatment. Generally, the goal of treatment is to maintain the following blood glucose levels:  Before meals (preprandial): 80-130 mg/dL (4.4-7.2 mmol/L).  After meals (postprandial): below 180 mg/dL (10 mmol/L).  A1c level: less than 7%. Follow these instructions at home: Questions to ask your health care provider  Consider asking the following questions: ? Do I need to meet with a diabetes educator? ? Where can I find a support group for people with diabetes? ? What equipment will I need to manage my diabetes at home? ? What diabetes medicines do I need, and when should I take them? ? How often do I need to check my blood glucose? ? What number can I call if I have questions? ? When is my next appointment? General instructions  Take over-the-counter and prescription medicines only as told by your health care provider.  Keep all follow-up visits as told by your health care provider. This is important.  For more information about diabetes, visit: ? American Diabetes Association (ADA): www.diabetes.org ? American Association of Diabetes Educators (AADE): www.diabeteseducator.org Contact a health care provider if:  Your blood glucose is at or above 240 mg/dL (13.3 mmol/L) for 2 days in a row.  You have been sick or have had a fever for 2 days or longer, and you are not getting better.  You have any of the following problems for more than 6 hours: ? You cannot eat or drink. ? You have nausea and vomiting. ? You have diarrhea. Get help right away if:  Your blood glucose is lower than 54 mg/dL (3.0 mmol/L).  You become confused or you have trouble thinking clearly.  You have difficulty breathing.  You have moderate or large ketone levels in your urine. Summary  Type 2 diabetes (type 2 diabetes mellitus) is a long-term (chronic) disease. In type 2 diabetes, the pancreas does not make enough of a  hormone called insulin, or cells in the body do not respond properly to insulin that the body makes (insulin resistance).  This condition is treated by making diet and lifestyle changes and taking diabetes medicines or insulin.  Your health care provider will set individualized treatment goals for you. Your goals will be based on your age, other medical conditions you have, and how you respond to diabetes treatment.  Keep all follow-up visits as told by your health care provider. This is important. This information is not intended to replace advice given to you by your health care provider. Make sure you discuss any questions you have with your health care provider. Document Released: 02/02/2005 Document Revised: 09/03/2016 Document Reviewed: 03/08/2015 [employer] Interactive Patient Education  2019 [employer]  

## 2018-06-23 ENCOUNTER — Encounter: Payer: Self-pay | Admitting: Internal Medicine

## 2018-06-23 ENCOUNTER — Other Ambulatory Visit: Payer: Self-pay | Admitting: Internal Medicine

## 2018-06-23 ENCOUNTER — Ambulatory Visit (INDEPENDENT_AMBULATORY_CARE_PROVIDER_SITE_OTHER): Payer: Medicare HMO | Admitting: Internal Medicine

## 2018-06-23 VITALS — Ht 71.0 in | Wt 184.0 lb

## 2018-06-23 DIAGNOSIS — K219 Gastro-esophageal reflux disease without esophagitis: Secondary | ICD-10-CM

## 2018-06-23 DIAGNOSIS — E785 Hyperlipidemia, unspecified: Secondary | ICD-10-CM

## 2018-06-23 DIAGNOSIS — K7581 Nonalcoholic steatohepatitis (NASH): Secondary | ICD-10-CM | POA: Diagnosis not present

## 2018-06-23 DIAGNOSIS — R195 Other fecal abnormalities: Secondary | ICD-10-CM

## 2018-06-23 DIAGNOSIS — R143 Flatulence: Secondary | ICD-10-CM

## 2018-06-23 DIAGNOSIS — Z86010 Personal history of colonic polyps: Secondary | ICD-10-CM

## 2018-06-23 DIAGNOSIS — D696 Thrombocytopenia, unspecified: Secondary | ICD-10-CM | POA: Diagnosis not present

## 2018-06-23 DIAGNOSIS — K3189 Other diseases of stomach and duodenum: Secondary | ICD-10-CM

## 2018-06-23 DIAGNOSIS — R140 Abdominal distension (gaseous): Secondary | ICD-10-CM

## 2018-06-23 DIAGNOSIS — K31A0 Gastric intestinal metaplasia, unspecified: Secondary | ICD-10-CM

## 2018-06-23 MED ORDER — SIMVASTATIN 20 MG PO TABS: 20.0000 mg | ORAL_TABLET | Freq: Every day | ORAL | 1 refills | Status: DC

## 2018-06-23 NOTE — Progress Notes (Signed)
This service was provided via telemedicine.  Telephone encounter The patient was located at home The provider was located in provider's GI office. The patient did consent to this telephone visit and is aware of possible charges through their insurance for this visit.   The persons participating in this telemedicine service were the patient and I. Time spent on call:  17 min   Subjective:    Patient ID: Angel Rodgers, male    DOB: January 21, 1946, 73 y.o.   MRN: [id number]  HPI Angel Rodgers is a 73 year old male with a history of NASH, GERD, chronic active gastritis with intestinal metaplasia, adenomatous colon polyp, diabetes, hyperlipidemia who is seen in follow-up.  He is seen virtually in the setting of COVID-19 pandemic.  He was last seen in the office on 10/21/2017.  He had an upper endoscopy performed on 03/31/2016 which revealed an irregular Z line.  This was biopsied and negative for Barrett's esophagus.  He had deformity in the gastric antrum as well as gastritis which was biopsied.  This showed chronic active gastritis with intestinal metaplasia.  There was no H. pylori (of note his wife had and was treated for H. pylori).  Decision at that time due to the active inflammation was to consider repeating the upper endoscopy at the two-year mark.  This had been scheduled but was delayed due to COVID-19.  He has been taking pantoprazole 40 mg a day.  He reports this works very well for his reflux and heartburn.  He is only had one episode of breakthrough heartburn in the last 6 months.  On that day he had heartburn and vomiting.  He denies dysphagia and odynophagia.  Occasional upper abdominal discomfort but none recently.  He reports his bowels for the most part are regular.  He has diarrhea or loose stools about 1 day/week.  He does report gas which is foul-smelling.  He does not see blood in his stool or melena.  He reports he had a longstanding history of constipation but this has changed to as  previously described.  He states however if he will use MiraLAX on occasion his gas and abdominal bloating seem to be better.  He tried probiotic for nearly 6 months without change in his bowel habit or intestinal gas.  No abdominal or lower extremity swelling.  He works to control his blood sugars.  Recent A1c was 8.4, which was up from 7.78 months ago.  In the last 4 years we do not have any documentation of A1c being higher than 8.  He saw primary care yesterday and had labs done.  Blood counts were normal though platelets were slightly low at 145.  Hepatic function panel revealed a slightly elevated ALT at 58 and a normal AST at 36.  In the past year both AST and ALT had been slightly higher than yesterday.  Albumin is normal at 4.4.  Total bilirubin normal at 0.8.  Normal alkaline phosphatase at 90.  INR was not checked  MRI abdomen was done last year in February, reviewed today.  Showed fatty liver but no nodular contours.  No evidence of portal hypertensive changes in the abdomen.  Spleen size was normal.  Multiple tiny gallstones were noted in the gallbladder.   Review of Systems As per HPI, otherwise negative  Current Medications, Allergies, Past Medical History, Past Surgical History, Family History and Social History were reviewed in [employer] record.     Objective:   Physical Exam No physical  exam, virtual visit  CBC    Component Value Date/Time   WBC 7.7 06/22/2018 0955   RBC 4.71 06/22/2018 0955   HGB 14.4 06/22/2018 0955   HCT 43.2 06/22/2018 0955   PLT 145.0 (L) 06/22/2018 0955   MCV 91.6 06/22/2018 0955   MCHC 33.4 06/22/2018 0955   RDW 13.8 06/22/2018 0955   LYMPHSABS 2.2 06/22/2018 0955   MONOABS 0.8 06/22/2018 0955   EOSABS 0.4 06/22/2018 0955   BASOSABS 0.1 06/22/2018 0955   CMP     Component Value Date/Time   NA 139 06/22/2018 0955   K 4.7 06/22/2018 0955   CL 104 06/22/2018 0955   CO2 27 06/22/2018 0955   GLUCOSE 148 (H)  06/22/2018 0955   BUN 20 06/22/2018 0955   CREATININE 0.86 06/22/2018 0955   CALCIUM 8.8 06/22/2018 0955   PROT 6.7 06/22/2018 0955   ALBUMIN 4.4 06/22/2018 0955   AST 36 06/22/2018 0955   ALT 58 (H) 06/22/2018 0955   ALKPHOS 90 06/22/2018 0955   BILITOT 0.8 06/22/2018 0955   No results found for: INR, PROTIME   HgbA1C 8.4  Lab Results  Component Value Date   TSH 1.65 06/22/2018     MRI ABDOMEN WITHOUT AND WITH CONTRAST   TECHNIQUE: Multiplanar multisequence MR imaging of the abdomen was performed both before and after the administration of intravenous contrast.   CONTRAST:  52mL MULTIHANCE GADOBENATE DIMEGLUMINE 529 MG/ML IV SOLN   COMPARISON:  03/26/2017   FINDINGS: Lower chest: Unremarkable   Hepatobiliary: Diffuse hepatic steatosis.   Multiple small gallstones are subtle dependently in the gallbladder. No biliary dilatation.   Corresponding to the sonographic abnormality there is a 3.6 by 3.3 cm sharply defined T2 hyperintense, T1 hypointense lesion in the right hepatic lobe demonstrating peripheral nodular discontinuous centripetal enhancement highly characteristic of hepatic hemangioma. Less than half of this lesion is filled in on the 3 minutes images.   Pancreas:  Unremarkable   Spleen:  Unremarkable   Adrenals/Urinary Tract: Several tiny nonenhancing fluid signal intensity renal lesions at or below 5 mm in diameter are statistically likely to be cysts but technically nonspecific due to small size.   Stomach/Bowel: Unremarkable   Vascular/Lymphatic:  Unremarkable   Other:  No supplemental non-categorized findings.   Musculoskeletal: Mild lumbar degenerative disc disease.   IMPRESSION: 1. Diffuse hepatic steatosis. 2. 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 The only unusual feature of this lesion is the hypoechogenicity on ultrasound, but this might be explained given the degree of  fatty infiltration. The lesion did have enhanced through transmission on ultrasound, which is characteristic of hemangioma. This lesion requires no further workup. 3. Multiple tiny gallstones settle dependently in the gallbladder.     Electronically Signed   By: Van Clines M.D.   On: 04/10/2017 13:19      Assessment & Plan:  73 year old male with a history of NASH, GERD, chronic active gastritis with intestinal metaplasia, adenomatous colon polyp, diabetes, hyperlipidemia who is seen in follow-up.  1.  NASH --we discussed fatty liver today.  His BMI is 25.66 so his weight is reasonable.  His A1c is slightly increased indicating blood sugars being less well controlled.  I discussed the importance of maintaining risk factors for fatty liver including cholesterol and blood sugars.  He will discuss this further with Dr. Ronnald Ramp.  I encouraged him to exercise on a regular basis.  Liver enzymes are stable  and in fact slightly better.  No evidence for advanced liver fibrosis though his low platelets need to be watched.  2.  GERD --well-controlled pantoprazole 40 mg daily.  Continue this dose.  Please send this to [facility] mail order pharmacy.  90-day supply with 1 year of refill.  Irregular Z line was biopsied 2 years ago and not Barrett's.  3.  Intestinal metaplasia in the setting of chronic active gastritis --I have recommended surveillance endoscopy with plans for biopsy.  Also want to exclude H. pylori particularly in the setting that his wife was treated for this infection.  Also rule out dysplasia.  We discussed the risk, benefits and alternatives to upper endoscopy and he is agreeable and wishes to proceed --Arrange EGD  4.  Adenomatous colon polyp --surveillance colonoscopy May 2021  5.  Occasional loose stool, abdominal bloating and flatulence --given his chronic constipation my suspicion is that he may be having incomplete evacuation causing eventual diarrhea 1 day a week.  It is also  very telling that when he uses MiraLAX symptoms seem to be better.  Thus: --Begin MiraLAX 17 g once daily to every other day to try to regulate bowel habits, enhance more complete bowel evacuation and hopefully avoid both the excess gas and diarrhea he is having 1 to 2 days a week

## 2018-06-23 NOTE — Patient Instructions (Addendum)
You have been scheduled for endoscopy on 07/05/18 at 10:00 am.  You have been scheduled for a previsit telephone call on 06/27/18 at 2:00 pm.  You will be due for a recall colonoscopy in 06/2019. We will send you a reminder in the mail when it gets closer to that time.  Please purchase the following medications over the counter and take as directed: MiraLAX 17 g (1 capful) once daily to every other day to try to regulate bowel habits, enhance more complete bowel evacuation and hopefully avoid both the excess gas and diarrhea

## 2018-06-27 ENCOUNTER — Ambulatory Visit (AMBULATORY_SURGERY_CENTER): Payer: Medicare HMO | Admitting: *Deleted

## 2018-06-27 ENCOUNTER — Encounter: Payer: Self-pay | Admitting: Internal Medicine

## 2018-06-27 ENCOUNTER — Other Ambulatory Visit: Payer: Self-pay

## 2018-06-27 VITALS — Ht 71.0 in | Wt 184.0 lb

## 2018-06-27 DIAGNOSIS — K7581 Nonalcoholic steatohepatitis (NASH): Secondary | ICD-10-CM

## 2018-06-27 NOTE — Progress Notes (Signed)
Pre-visit done with patient via phone. Patient denies any allergies to eggs or soy. Patient denies any problems with anesthesia/sedation. Patient denies any oxygen use at home. Patient denies taking any diet/weight loss medications or blood thinners. EMMI education assisgned to patient on EGD, this was explained and instructions given to patient. Prep instructions mailed to pt today-pt is aware.

## 2018-06-28 DIAGNOSIS — M67432 Ganglion, left wrist: Secondary | ICD-10-CM | POA: Diagnosis not present

## 2018-06-28 DIAGNOSIS — M25532 Pain in left wrist: Secondary | ICD-10-CM | POA: Diagnosis not present

## 2018-07-03 ENCOUNTER — Telehealth: Payer: Self-pay | Admitting: *Deleted

## 2018-07-03 NOTE — Telephone Encounter (Signed)
LMOM that we will be calling tomorrow to go over covid screening questions

## 2018-07-04 ENCOUNTER — Telehealth: Payer: Self-pay | Admitting: *Deleted

## 2018-07-04 NOTE — Telephone Encounter (Signed)
Covid-19 travel screening questions  Have you traveled in the last 14 days?no If yes where?  Do you now or have you had a fever in the last 14 days?no  Do you have any respiratory symptoms of shortness of breath or cough now or in the last 14 days?no  Do you have a medical history of Congestive Heart Failure?  Do you have a medical history of lung disease?  Do you have any family members or close contacts with diagnosed or suspected Covid-19?no  Pt made aware of care partner policy and will bring a mask with him if he has one available. SM

## 2018-07-05 ENCOUNTER — Encounter: Payer: Self-pay | Admitting: Internal Medicine

## 2018-07-05 ENCOUNTER — Other Ambulatory Visit: Payer: Self-pay

## 2018-07-05 ENCOUNTER — Ambulatory Visit (AMBULATORY_SURGERY_CENTER): Payer: Medicare HMO | Admitting: Internal Medicine

## 2018-07-05 VITALS — BP 107/53 | HR 53 | Temp 96.6°F | Resp 18 | Ht 71.0 in | Wt 184.0 lb

## 2018-07-05 DIAGNOSIS — K219 Gastro-esophageal reflux disease without esophagitis: Secondary | ICD-10-CM | POA: Diagnosis not present

## 2018-07-05 DIAGNOSIS — K2950 Unspecified chronic gastritis without bleeding: Secondary | ICD-10-CM

## 2018-07-05 DIAGNOSIS — K3189 Other diseases of stomach and duodenum: Secondary | ICD-10-CM | POA: Diagnosis not present

## 2018-07-05 DIAGNOSIS — K2970 Gastritis, unspecified, without bleeding: Secondary | ICD-10-CM

## 2018-07-05 DIAGNOSIS — K31A0 Gastric intestinal metaplasia, unspecified: Secondary | ICD-10-CM

## 2018-07-05 DIAGNOSIS — K7581 Nonalcoholic steatohepatitis (NASH): Secondary | ICD-10-CM

## 2018-07-05 MED ORDER — SODIUM CHLORIDE 0.9 % IV SOLN: 500.0000 mL | Freq: Once | INTRAVENOUS | Status: DC

## 2018-07-05 NOTE — Progress Notes (Signed)
COVID screen per J. Marcelino Scot and temp per Loel Ro, LPN

## 2018-07-05 NOTE — Patient Instructions (Signed)
   Await pathology results.   YOU HAD AN ENDOSCOPIC PROCEDURE TODAY AT [facility]:   Refer to the procedure report that was given to you for any specific questions about what was found during the examination.  If the procedure report does not answer your questions, please call your gastroenterologist to clarify.  If you requested that your care partner not be given the details of your procedure findings, then the procedure report has been included in a sealed envelope for you to review at your convenience later.  YOU SHOULD EXPECT: Some feelings of bloating in the abdomen. Passage of more gas than usual.  Walking can help get rid of the air that was put into your GI tract during the procedure and reduce the bloating. If you had a lower endoscopy (such as a colonoscopy or flexible sigmoidoscopy) you may notice spotting of blood in your stool or on the toilet paper. If you underwent a bowel prep for your procedure, you may not have a normal bowel movement for a few days.  Please Note:  You might notice some irritation and congestion in your nose or some drainage.  This is from the oxygen used during your procedure.  There is no need for concern and it should clear up in a day or so.  SYMPTOMS TO REPORT IMMEDIATELY:    Following upper endoscopy (EGD)  Vomiting of blood or coffee ground material  New chest pain or pain under the shoulder blades  Painful or persistently difficult swallowing  New shortness of breath  Fever of 100F or higher  Black, tarry-looking stools  For urgent or emergent issues, a gastroenterologist can be reached at any hour by calling [phone].   DIET:  We do recommend a small meal at first, but then you may proceed to your regular diet.  Drink plenty of fluids but you should avoid alcoholic beverages for 24 hours.  ACTIVITY:  You should plan to take it easy for the rest of today and you should NOT DRIVE or use heavy machinery until tomorrow  (because of the sedation medicines used during the test).    FOLLOW UP: Our staff will call the number listed on your records 48-72 hours following your procedure to check on you and address any questions or concerns that you may have regarding the information given to you following your procedure. If we do not reach you, we will leave a message.  We will attempt to reach you two times.  During this call, we will ask if you have developed any symptoms of COVID 19. If you develop any symptoms (for example fever, flu-like symptoms, shortness of breath, cough etc.) before then, please call [phone].  If any biopsies were taken you will be contacted by phone or by letter within the next 1-3 weeks.  Please call us at [phone] if you have not heard about the biopsies in 3 weeks.    SIGNATURES/CONFIDENTIALITY: You and/or your care partner have signed paperwork which will be entered into your electronic medical record.  These signatures attest to the fact that that the information above on your After Visit Summary has been reviewed and is understood.  Full responsibility of the confidentiality of this discharge information lies with you and/or your care-partner.

## 2018-07-05 NOTE — Op Note (Signed)
[place] Patient Name: Angel Rodgers Procedure Date: 07/05/2018 10:10 AM MRN: [id number] Endoscopist: Jerene Bears , MD Age: 73 Referring MD:  Date of Birth: 1945-06-04 Gender: Male Account #: [id number] Procedure:                Upper GI endoscopy Indications:              Follow-up of chronic active gastritis intestinal                            metaplasia (no dysplasia or H. Pylori though last                            biopsies without mapping). Medicines:                Monitored Anesthesia Care Procedure:                Pre-Anesthesia Assessment:                           - Prior to the procedure, a History and Physical                            was performed, and patient medications and                            allergies were reviewed. The patient's tolerance of                            previous anesthesia was also reviewed. The risks                            and benefits of the procedure and the sedation                            options and risks were discussed with the patient.                            All questions were answered, and informed consent                            was obtained. Prior Anticoagulants: The patient has                            taken no previous anticoagulant or antiplatelet                            agents. ASA Grade Assessment: II - A patient with                            mild systemic disease. After reviewing the risks                            and benefits, the patient was deemed in  satisfactory condition to undergo the procedure.                           After obtaining informed consent, the endoscope was                            passed under direct vision. Throughout the                            procedure, the patient's blood pressure, pulse, and                            oxygen saturations were monitored continuously. The                            Endoscope was introduced through  the mouth, and                            advanced to the second part of duodenum. The upper                            GI endoscopy was accomplished without difficulty.                            The patient tolerated the procedure well. Scope In: Scope Out: Findings:                 Normal mucosa was found in the entire esophagus.                           The Z-line was variable and was found 40 cm from                            the incisors.                           Diffuse atrophic mucosa was found in the gastric                            antrum. Mucosa examined with white light and NBI.                           The cardia, gastric fundus and gastric body were                            normal.                           Targeted biopsies were obtained with cold forceps                            for histology, Helicobacter pylori testing and                            evaluation of chronic inflammation in the  cardia,                            in the gastric fundus, in the gastric body, at the                            incisura, in the gastric antrum and in the                            prepyloric region of the stomach based on narrow                            band imaging. Topographic mapping. A total of 3                            specimen bottles were sent to pathology(Jar 1 =                            prepyloric, antrum and incisura; Jar 2 = gastric                            body; Jar 3 = gastric cardia and fundus).                           The examined duodenum was normal. Complications:            No immediate complications. Estimated Blood Loss:     Estimated blood loss was minimal. Impression:               - Normal mucosa was found in the entire esophagus.                           - Z-line variable, 40 cm from the incisors.                           - Gastric mucosal atrophy.                           - Normal cardia, gastric fundus and gastric body.                            - Normal examined duodenum.                           - Imaging-targeted biopsies obtained in the cardia,                            in the gastric fundus, in the gastric body, in the                            incisura, in the gastric antrum and in the                            prepyloric region of the stomach. Recommendation:           -  Patient has a contact number available for                            emergencies. The signs and symptoms of potential                            delayed complications were discussed with the                            patient. Return to normal activities tomorrow.                            Written discharge instructions were provided to the                            patient.                           - Resume previous diet.                           - Continue present medications.                           - Await pathology results. Jerene Bears, MD 07/05/2018 10:45:35 AM This report has been signed electronically.

## 2018-07-05 NOTE — Progress Notes (Signed)
Called to room to assist during endoscopic procedure.  Patient ID and intended procedure confirmed with present staff. Received instructions for my participation in the procedure from the performing physician.  

## 2018-07-05 NOTE — Progress Notes (Signed)
A/ox3, pleased with MAC, report to RN 

## 2018-07-07 ENCOUNTER — Telehealth: Payer: Self-pay | Admitting: *Deleted

## 2018-07-07 ENCOUNTER — Other Ambulatory Visit: Payer: Self-pay | Admitting: Internal Medicine

## 2018-07-07 NOTE — Telephone Encounter (Signed)
  Follow up Call-  Call back number 07/05/2018 03/31/2016  Post procedure Call Back phone  # [phone]  Permission to leave phone message Yes Yes  Some recent data might be hidden     Patient questions:  Do you have a fever, pain , or abdominal swelling? No. Pain Score  0 *  Have you tolerated food without any problems? Yes.    Have you been able to return to your normal activities? Yes.    Do you have any questions about your discharge instructions: Diet   No. Medications  No. Follow up visit  No.  Do you have questions or concerns about your Care? No.  Actions: * If pain score is 4 or above: No action needed, pain <4.   1. Have you developed a fever since your procedure? no  2.   Have you had an respiratory symptoms (SOB or cough) since your procedure? no  3.   Have you tested positive for COVID 19 since your procedure no  4.   Have you had any family members/close contacts diagnosed with the COVID 19 since your procedure?  no   If any of these questions are a yes, please inquire if patient has been seen by family doctor and route this note to Joylene John, Therapist, sports.

## 2018-07-12 ENCOUNTER — Encounter: Payer: Self-pay | Admitting: Internal Medicine

## 2018-07-21 ENCOUNTER — Other Ambulatory Visit: Payer: Self-pay | Admitting: Internal Medicine

## 2018-08-01 ENCOUNTER — Other Ambulatory Visit: Payer: Self-pay | Admitting: Physician Assistant

## 2018-11-02 ENCOUNTER — Other Ambulatory Visit: Payer: Self-pay | Admitting: Internal Medicine

## 2018-11-02 DIAGNOSIS — E118 Type 2 diabetes mellitus with unspecified complications: Secondary | ICD-10-CM

## 2018-11-02 DIAGNOSIS — E785 Hyperlipidemia, unspecified: Secondary | ICD-10-CM

## 2018-11-12 ENCOUNTER — Other Ambulatory Visit: Payer: Self-pay | Admitting: Internal Medicine

## 2018-11-21 ENCOUNTER — Other Ambulatory Visit: Payer: Self-pay | Admitting: Physician Assistant

## 2019-01-09 ENCOUNTER — Other Ambulatory Visit: Payer: Self-pay | Admitting: Internal Medicine

## 2019-01-09 DIAGNOSIS — E785 Hyperlipidemia, unspecified: Secondary | ICD-10-CM

## 2019-01-09 DIAGNOSIS — E118 Type 2 diabetes mellitus with unspecified complications: Secondary | ICD-10-CM

## 2019-03-01 ENCOUNTER — Other Ambulatory Visit: Payer: Self-pay | Admitting: Physician Assistant

## 2019-03-13 ENCOUNTER — Other Ambulatory Visit: Payer: Self-pay | Admitting: Internal Medicine

## 2019-04-03 ENCOUNTER — Other Ambulatory Visit: Payer: Self-pay | Admitting: Internal Medicine

## 2019-04-03 DIAGNOSIS — I714 Abdominal aortic aneurysm, without rupture, unspecified: Secondary | ICD-10-CM | POA: Insufficient documentation

## 2019-04-05 ENCOUNTER — Telehealth: Payer: Self-pay

## 2019-04-05 NOTE — Telephone Encounter (Signed)
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

## 2019-04-05 NOTE — Telephone Encounter (Signed)
New message   The patient saw Dr. Ronnald Ramp on 06/22/2018 needs clarification on why the AAA duplex was ordered    AAA duplex appt on 04/20/2019   R/s appt was on  04/06/2019

## 2019-04-06 ENCOUNTER — Other Ambulatory Visit: Payer: Self-pay | Admitting: Internal Medicine

## 2019-04-06 ENCOUNTER — Ambulatory Visit (HOSPITAL_COMMUNITY): Payer: Medicare HMO

## 2019-04-20 ENCOUNTER — Encounter: Payer: Self-pay | Admitting: Internal Medicine

## 2019-04-20 ENCOUNTER — Ambulatory Visit (HOSPITAL_COMMUNITY)
Admission: RE | Admit: 2019-04-20 | Discharge: 2019-04-20 | Disposition: A | Discharge status: Home / Self Care | Payer: Medicare HMO | Source: Ambulatory Visit | Attending: Internal Medicine | Admitting: Internal Medicine

## 2019-04-20 ENCOUNTER — Other Ambulatory Visit: Payer: Self-pay

## 2019-04-20 DIAGNOSIS — I714 Abdominal aortic aneurysm, without rupture, unspecified: Secondary | ICD-10-CM

## 2019-04-24 ENCOUNTER — Ambulatory Visit: Payer: Medicare HMO

## 2019-05-11 ENCOUNTER — Encounter: Payer: Self-pay | Admitting: Gastroenterology

## 2019-05-18 ENCOUNTER — Other Ambulatory Visit: Payer: Self-pay

## 2019-05-18 ENCOUNTER — Ambulatory Visit (INDEPENDENT_AMBULATORY_CARE_PROVIDER_SITE_OTHER): Payer: Medicare HMO

## 2019-05-18 VITALS — BP 120/78 | HR 83 | Temp 98.3°F | Resp 16 | Ht 71.0 in | Wt 196.6 lb

## 2019-05-18 DIAGNOSIS — Z0000 Encounter for general adult medical examination without abnormal findings: Secondary | ICD-10-CM

## 2019-05-18 NOTE — Progress Notes (Addendum)
Subjective:   Angel Rodgers is a 74 y.o. male who presents for Medicare Annual/Subsequent preventive examination.  Review of Systems:  Medicare Wellness Visit Cardiac Risk Factors include: advanced age (>85mn, >>62women);diabetes mellitus;dyslipidemia;male gender  Sleep Patterns: sleeps well at night Home Safety/Smoke Alarms: Feels safe in home; Smoke alarms in place. Living Environment: 1-story house; Lives with his wife ; No need for DME; has a good family support system. Seat Belt Safety/Bike Helmet: Wears seat belt.     Objective:    Vitals: BP 120/78 (BP Location: Left Arm, Patient Position: Sitting, Cuff Size: Normal)   Pulse 83   Temp 98.3 F (36.8 C)   Resp 16   Ht 5' 11"  (1.803 m)   Wt 196 lb 9.6 oz (89.2 kg)   SpO2 96%   BMI 27.42 kg/m   Body mass index is 27.42 kg/m.  Advanced Directives 05/18/2019 04/18/2018 02/22/2017 03/31/2016 11/30/2015 11/05/2015 10/31/2014  Does Patient Have a Medical Advance Directive? Yes Yes Yes No No Yes Yes  Type of AParamedicof [place]row[place]Living will [place]iddle VillageLiving will [place]Living will - - - [place] will  Does patient want to make changes to medical advance directive? No - Patient declined - - - - - No - Patient declined  Copy of [place] Chart? No - copy requested No - copy requested No - copy requested - - - Yes  Would patient like information on creating a medical advance directive? - - - - No - patient declined information - -    Tobacco Social History   Tobacco Use  Smoking Status Former Smoker  . Packs/day: 0.50  . Years: 20.00  . Pack years: 10.00  . Types: Cigarettes, Pipe  . Quit date: 02/17/1983  . Years since quitting: 36.2  Smokeless Tobacco Never Used  Tobacco Comment   Smoked until 83/ smoked pipe x 5 years     Counseling given: No Comment: Smoked until 83/ smoked pipe x 5 years   Clinical  Intake:  Pre-visit preparation completed: Yes  Pain : No/denies pain Pain Score: 0-No pain     Diabetes: Yes CBG done?: No Did pt. bring in CBG monitor from home?: No  How often do you need to have someone help you when you read instructions, pamphlets, or other written materials from your doctor or pharmacy?: 1 - Never What is the last grade level you completed in school?: [employer]  Interpreter Needed?: No  Information entered by :: Jerriah Ines N. HLowell Guitar LPN  Past Medical History:  Diagnosis Date  . Diverticulosis   . DM (diabetes mellitus) ([place]   . Endocarditis, valve unspecified, unspecified cause   . Gallstones   . GERD (gastroesophageal reflux disease)   . Hepatic steatosis   . History of rheumatic fever   . HLD (hyperlipidemia)   . Liver mass   . Syncope and collapse   . Tubular adenoma of colon    Past Surgical History:  Procedure Laterality Date  . COLONOSCOPY  2016  . ESOPHAGOGASTRODUODENOSCOPY    . FINGER SURGERY Right    cyst removed middle finger  . MOUTH SURGERY     Family History  Problem Relation Age of Onset  . Coronary artery disease Mother        AICD/PACER  . Hypertension Mother   . Diabetes Mother   . Hyperlipidemia Mother   . Colon polyps Mother   . Coronary artery  disease Father        AICD Pacer  . Cancer Father 66       ANAL  . Colon cancer Maternal Uncle   . Stomach cancer Other        materal great uncle and great aunt  . Esophageal cancer Sister        1/2 sister  . Prostate cancer Neg Hx   . Liver disease Neg Hx    Social History   Socioeconomic History  . Marital status: Married    Spouse name: Not on file  . Number of children: 2  . Years of education: 37  . Highest education level: Not on file  Occupational History  . Occupation: Education administrator: RETIRED  Tobacco Use  . Smoking status: Former Smoker    Packs/day: 0.50    Years: 20.00    Pack years: 10.00    Types: Cigarettes, Pipe    Quit date: 02/17/1983     Years since quitting: 36.2  . Smokeless tobacco: Never Used  . Tobacco comment: Smoked until 83/ smoked pipe x 5 years  Substance and Sexual Activity  . Alcohol use: Yes    Alcohol/week: 0.0 standard drinks    Comment: occasional beer every week or 2  . Drug use: No  . Sexual activity: Yes  Other Topics Concern  . Not on file  Social History Narrative   HSG, community college 2 years. [employer]-3 years, E5 at discharge. Married  '65. 1 son '70, 1 dtr '69; 2 grandchildren. Work- drove Theatre manager now retired   Investment banker, operational of Radio broadcast assistant Strain:   . Difficulty of Paying Living Expenses:   Food Insecurity:   . Worried About Charity fundraiser in the Last Year:   . Arboriculturist in the Last Year:   Transportation Needs:   . Film/video editor (Medical):   Marland Kitchen Lack of Transportation (Non-Medical):   Physical Activity:   . Days of Exercise per Week:   . Minutes of Exercise per Session:   Stress:   . Feeling of Stress :   Social Connections:   . Frequency of Communication with Friends and Family:   . Frequency of Social Gatherings with Friends and Family:   . Attends Religious Services:   . Active Member of Clubs or Organizations:   . Attends Archivist Meetings:   Marland Kitchen Marital Status:     Outpatient Encounter Medications as of 05/18/2019  Medication Sig  . ACCU-CHEK AVIVA PLUS test strip CHECK BLOOD SUGAR TWICE DAILY  . Alcohol Swabs (B-D SINGLE USE SWABS REGULAR) PADS Use as directed top check blood sugars dx E11.8  . aspirin EC 81 MG tablet Take 81 mg by mouth daily.  . Blood Glucose Calibration (ACCU-CHEK SMARTVIEW CONTROL) LIQD Use BID dx e11.8  . Blood Glucose Monitoring Suppl (ACCU-CHEK AVIVA PLUS) w/Device KIT USE AS DIRECTED  . Lancets (ACCU-CHEK SOFT TOUCH) lancets Use to check blood sugar twice daily. DX: E11.9  . metFORMIN (GLUCOPHAGE) 1000 MG tablet TAKE 1 TABLET TWICE DAILY WITH A MEAL  . pantoprazole (PROTONIX) 40 MG tablet  TAKE 1 TABLET EVERY DAY  . pioglitazone (ACTOS) 15 MG tablet TAKE 1 TABLET EVERY DAY  . Probiotic Product (PROBIOTIC DAILY PO) Take by mouth.  . simvastatin (ZOCOR) 20 MG tablet TAKE 1 TABLET (20 MG TOTAL) BY MOUTH AT BEDTIME.   No facility-administered encounter medications on file as of 05/18/2019.  Activities of Daily Living In your present state of health, do you have any difficulty performing the following activities: 05/18/2019  Hearing? N  Vision? N  Difficulty concentrating or making decisions? N  Walking or climbing stairs? N  Dressing or bathing? N  Doing errands, shopping? N  Using the Toilet? N  In the past six months, have you accidently leaked urine? N  Do you have problems with loss of bowel control? N  Managing your Medications? N  Managing your Finances? N  Housekeeping or managing your Housekeeping? N  Some recent data might be hidden    Patient Care Team: Janith Lima, MD as PCP - General (Internal Medicine) Dorrene German Shelda Altes., MD (Gastroenterology)   Assessment:   This is a routine wellness examination for Rozell.  Exercise Activities and Dietary recommendations Current Exercise Habits: The patient does not participate in regular exercise at present, Exercise limited by: None identified  Goals    . control sweets (pt-stated)     Will slow down eating sweets of which the wife will assist. Uses sugar free in cooking HFCS above;     Marland Kitchen Exercise 3x per week (30 min per time)     Walk 30 minutes three times per week    . Patient Stated     Get my sugar down by watching carbohydrates and sugar, join [employer].    . Patient Stated     Monitor my diet to keep my blood sugar lower.     . Patient Stated     Control his blood sugar       Fall Risk Fall Risk  05/18/2019 04/18/2018 03/03/2017 11/30/2015 11/05/2015  Falls in the past year? 0 0 No No No  Number falls in past yr: 0 - - - -  Injury with Fall? 0 - - - -  Risk for fall due to : No Fall Risks  - - - -  Follow up Falls evaluation completed;Education provided;Falls prevention discussed - - - -   Is the patient's home free of loose throw rugs in walkways, pet beds, electrical cords, etc?   yes      Grab bars in the bathroom? yes      Handrails on the stairs?   yes      Adequate lighting?   yes    Depression Screen PHQ 2/9 Scores 05/18/2019 04/18/2018 03/03/2017 11/30/2015  PHQ - 2 Score 0 0 0 0    Cognitive Function MMSE - Mini Mental State Exam 02/22/2017 11/05/2015 10/25/2014  Not completed: - (No Data) (No Data)  Orientation to time 5 - -  Orientation to Place 5 - -  Registration 3 - -  Attention/ Calculation 5 - -  Recall 2 - -  Language- name 2 objects 2 - -  Language- repeat 1 - -  Language- follow 3 step command 3 - -  Language- read & follow direction 1 - -  Write a sentence 1 - -  Copy design 1 - -  Total score 29 - -     6CIT Screen 05/18/2019  What Year? 0 points  What month? 0 points  What time? 0 points  Count back from 20 0 points  Months in reverse 0 points  Repeat phrase 0 points  Total Score 0    Immunization History  Administered Date(s) Administered  . Pneumococcal Conjugate-13 08/21/2013  . Pneumococcal Polysaccharide-23 10/25/2012  . Td 03/07/2010    Qualifies for Shingles Vaccine?  declined  Screening Tests Health Maintenance  Topic Date Due  . OPHTHALMOLOGY EXAM  12/02/2018  . HEMOGLOBIN A1C  12/23/2018  . URINE MICROALBUMIN  06/22/2019  . FOOT EXAM  06/22/2019  . INFLUENZA VACCINE  09/17/2019  . TETANUS/TDAP  03/07/2020  . Hepatitis C Screening  Completed  . PNA vac Low Risk Adult  Completed   Cancer Screenings: Lung: Low Dose CT Chest recommended if Age 39-80 years, 30 pack-year currently smoking OR have quit w/in 15years. Patient does not qualify. Colorectal: last done 05/14/2015      Plan:     Reviewed health maintenance screenings with patient today and relevant education, vaccines, and/or referrals were provided.    Continue doing brain stimulating activities (puzzles, reading, adult coloring books, staying active) to keep memory sharp.   Continue to eat heart healthy diet (full of fruits, vegetables, whole grains, lean protein, water--limit salt, fat, and sugar intake) and increase physical activity as tolerated.  I have personally reviewed and noted the following in the patient's chart:   . Medical and social history . Use of alcohol, tobacco or illicit drugs  . Current medications and supplements . Functional ability and status . Nutritional status . Physical activity . Advanced directives . List of other physicians . Hospitalizations, surgeries, and ER visits in previous 12 months . Vitals . Screenings to include cognitive, depression, and falls . Referrals and appointments  In addition, I have reviewed and discussed with patient certain preventive protocols, quality metrics, and best practice recommendations. A written personalized care plan for preventive services as well as general preventive health recommendations were provided to patient.     Sheral Flow, LPN  02/22/735 Nurse Health Advisor  Medical screening examination/treatment/procedure(s) were performed by non-physician practitioner and as supervising physician I was immediately available for consultation/collaboration. I agree with above. Scarlette Calico, MD

## 2019-05-18 NOTE — Patient Instructions (Addendum)
Angel Rodgers , Thank you for taking time to come for your Medicare Wellness Visit. I appreciate your ongoing commitment to your health goals. Please review the following plan we discussed and let me know if I can assist you in the future.   Screening recommendations/referrals: Colorectal Screening: last done 07/05/2014  Vision and Dental Exams: Recommended annual ophthalmology exams for early detection of glaucoma and other disorders of the eye Recommended annual dental exams for proper oral hygiene  Diabetic Exams: Diabetic Eye Exam: last done 12/01/2017 Diabetic Foot Exam: last done 06/22/2018  Vaccinations: Influenza vaccine: last done 10/25/2012 Pneumococcal vaccine: up to date (10/25/2012, 08/21/2013) Tdap vaccine: last done 03/07/2010 Shingles vaccine: declined Covid vaccine: 02/28/2019, 03/21/2019   Advanced directives: Advance directives discussed with you today.  Please bring a copy of your POA (Power of [place]) and/or Living Will to your next appointment.  Goals:  Recommend to drink at least 6-8 8oz glasses of water per day.  Recommend to exercise for at least 150 minutes per week.  Recommend to remove any items from the home that may cause slips or trips.  Recommend to decrease portion sizes by eating 3 small healthy meals and at least 2 healthy snacks per day.  Recommend to begin DASH diet as directed below.  Recommend to continue efforts to reduce smoking habits until no longer smoking. Smoking Cessation literature is attached below.  Next appointment: Please schedule your Annual Wellness Visit with your Nurse Health Advisor in one year.  Preventive Care 24 Years and Older, Male Preventive care refers to lifestyle choices and visits with your health care provider that can promote health and wellness. What does preventive care include?  A yearly physical exam. This is also called an annual well check.  Dental exams once or twice a year.  Routine eye exams. Ask your  health care provider how often you should have your eyes checked.  Personal lifestyle choices, including:  Daily care of your teeth and gums.  Regular physical activity.  Eating a healthy diet.  Avoiding tobacco and drug use.  Limiting alcohol use.  Practicing safe sex.  Taking low doses of aspirin every day if recommended by your health care provider..  Taking vitamin and mineral supplements as recommended by your health care provider. What happens during an annual well check? The services and screenings done by your health care provider during your annual well check will depend on your age, overall health, lifestyle risk factors, and family history of disease. Counseling  Your health care provider may ask you questions about your:  Alcohol use.  Tobacco use.  Drug use.  Emotional well-being.  Home and relationship well-being.  Sexual activity.  Eating habits.  History of falls.  Memory and ability to understand (cognition).  Work and work Statistician. Screening  You may have the following tests or measurements:  Height, weight, and BMI.  Blood pressure.  Lipid and cholesterol levels. These may be checked every 5 years, or more frequently if you are over 74 years old.  Skin check.  Lung cancer screening. You may have this screening every year starting at age 19 if you have a 30-pack-year history of smoking and currently smoke or have quit within the past 15 years.  Fecal occult blood test (FOBT) of the stool. You may have this test every year starting at age 54.  Flexible sigmoidoscopy or colonoscopy. You may have a sigmoidoscopy every 5 years or a colonoscopy every 10 years starting at age 44.  Prostate  cancer screening. Recommendations will vary depending on your family history and other risks.  Hepatitis C blood test.  Hepatitis B blood test.  Sexually transmitted disease (STD) testing.  Diabetes screening. This is done by checking your blood  sugar (glucose) after you have not eaten for a while (fasting). You may have this done every 1-3 years.  Abdominal aortic aneurysm (AAA) screening. You may need this if you are a current or former smoker.  Osteoporosis. You may be screened starting at age 21 if you are at high risk. Talk with your health care provider about your test results, treatment options, and if necessary, the need for more tests. Vaccines  Your health care provider may recommend certain vaccines, such as:  Influenza vaccine. This is recommended every year.  Tetanus, diphtheria, and acellular pertussis (Tdap, Td) vaccine. You may need a Td booster every 10 years.  Zoster vaccine. You may need this after age 41.  Pneumococcal 13-valent conjugate (PCV13) vaccine. One dose is recommended after age 70.  Pneumococcal polysaccharide (PPSV23) vaccine. One dose is recommended after age 20. Talk to your health care provider about which screenings and vaccines you need and how often you need them. This information is not intended to replace advice given to you by your health care provider. Make sure you discuss any questions you have with your health care provider. Document Released: 03/01/2015 Document Revised: 10/23/2015 Document Reviewed: 12/04/2014 Elsevier Interactive Patient Education  2017 [place] Prevention in the Home Falls can cause injuries. They can happen to people of all ages. There are many things you can do to make your home safe and to help prevent falls. What can I do on the outside of my home?  Regularly fix the edges of walkways and driveways and fix any cracks.  Remove anything that might make you trip as you walk through a door, such as a raised step or threshold.  Trim any bushes or trees on the path to your home.  Use bright outdoor lighting.  Clear any walking paths of anything that might make someone trip, such as rocks or tools.  Regularly check to see if handrails are loose or  broken. Make sure that both sides of any steps have handrails.  Any raised decks and porches should have guardrails on the edges.  Have any leaves, snow, or ice cleared regularly.  Use sand or salt on walking paths during winter.  Clean up any spills in your garage right away. This includes oil or grease spills. What can I do in the bathroom?  Use night lights.  Install grab bars by the toilet and in the tub and shower. Do not use towel bars as grab bars.  Use non-skid mats or decals in the tub or shower.  If you need to sit down in the shower, use a plastic, non-slip stool.  Keep the floor dry. Clean up any water that spills on the floor as soon as it happens.  Remove soap buildup in the tub or shower regularly.  Attach bath mats securely with double-sided non-slip rug tape.  Do not have throw rugs and other things on the floor that can make you trip. What can I do in the bedroom?  Use night lights.  Make sure that you have a light by your bed that is easy to reach.  Do not use any sheets or blankets that are too big for your bed. They should not hang down onto the floor.  Have a  firm chair that has side arms. You can use this for support while you get dressed.  Do not have throw rugs and other things on the floor that can make you trip. What can I do in the kitchen?  Clean up any spills right away.  Avoid walking on wet floors.  Keep items that you use a lot in easy-to-reach places.  If you need to reach something above you, use a strong step stool that has a grab bar.  Keep electrical cords out of the way.  Do not use floor polish or wax that makes floors slippery. If you must use wax, use non-skid floor wax.  Do not have throw rugs and other things on the floor that can make you trip. What can I do with my stairs?  Do not leave any items on the stairs.  Make sure that there are handrails on both sides of the stairs and use them. Fix handrails that are  broken or loose. Make sure that handrails are as long as the stairways.  Check any carpeting to make sure that it is firmly attached to the stairs. Fix any carpet that is loose or worn.  Avoid having throw rugs at the top or bottom of the stairs. If you do have throw rugs, attach them to the floor with carpet tape.  Make sure that you have a light switch at the top of the stairs and the bottom of the stairs. If you do not have them, ask someone to add them for you. What else can I do to help prevent falls?  Wear shoes that:  Do not have high heels.  Have rubber bottoms.  Are comfortable and fit you well.  Are closed at the toe. Do not wear sandals.  If you use a stepladder:  Make sure that it is fully opened. Do not climb a closed stepladder.  Make sure that both sides of the stepladder are locked into place.  Ask someone to hold it for you, if possible.  Clearly mark and make sure that you can see:  Any grab bars or handrails.  First and last steps.  Where the edge of each step is.  Use tools that help you move around (mobility aids) if they are needed. These include:  Canes.  Walkers.  Scooters.  Crutches.  Turn on the lights when you go into a dark area. Replace any light bulbs as soon as they burn out.  Set up your furniture so you have a clear path. Avoid moving your furniture around.  If any of your floors are uneven, fix them.  If there are any pets around you, be aware of where they are.  Review your medicines with your doctor. Some medicines can make you feel dizzy. This can increase your chance of falling. Ask your doctor what other things that you can do to help prevent falls. This information is not intended to replace advice given to you by your health care provider. Make sure you discuss any questions you have with your health care provider. Document Released: 11/29/2008 Document Revised: 07/11/2015 Document Reviewed: 03/09/2014 Elsevier  Interactive Patient Education  2017 [employer].

## 2019-05-22 ENCOUNTER — Telehealth: Payer: Self-pay

## 2019-05-22 DIAGNOSIS — E118 Type 2 diabetes mellitus with unspecified complications: Secondary | ICD-10-CM

## 2019-05-22 NOTE — Telephone Encounter (Signed)
-----   Message from Sheral Flow, LPN sent at [id number] 10:35 AM EDT ----- Can you call patient and have him to schedule to be seen.  He is needing a refill on his metformin.  Thanks!!!

## 2019-05-22 NOTE — Telephone Encounter (Signed)
MEDICATION: metFORMIN (GLUCOPHAGE) 1000 MG tablet  pioglitazone (ACTOS) 15 MG tablet  PHARMACY:  [place], [place] A 90 DAY SUPPLY : yes   IS PATIENT OUT OF MEDICATION:   IF NOT; HOW MUCH IS LEFT:   LAST APPOINTMENT DATE: @4 /06/2019  NEXT APPOINTMENT DATE:@4 /08/2019  DO WE HAVE YOUR PERMISSION TO LEAVE A DETAILED MESSAGE:  OTHER COMMENTS:    **Let patient know to contact pharmacy at the end of the day to make sure medication is ready. **  ** Please notify patient to allow 48-72 hours to process**  **Encourage patient to contact the pharmacy for refills or they can request refills through [facility]**

## 2019-05-22 NOTE — Telephone Encounter (Signed)
Patient decided to wait till CPE appt to come to the office, sent a refill request to nurse. Done

## 2019-05-23 MED ORDER — PIOGLITAZONE HCL 15 MG PO TABS: 15.0000 mg | ORAL_TABLET | Freq: Every day | ORAL | 0 refills | Status: DC

## 2019-05-23 MED ORDER — METFORMIN HCL 1000 MG PO TABS: ORAL_TABLET | ORAL | 0 refills | Status: DC

## 2019-05-23 NOTE — Telephone Encounter (Signed)
Pt is schedule for annual appt 5/19. Sent refill to [facility].Marland KitchenJohny Chess

## 2019-05-24 ENCOUNTER — Ambulatory Visit: Payer: Medicare HMO | Admitting: Internal Medicine

## 2019-06-19 ENCOUNTER — Other Ambulatory Visit: Payer: Self-pay | Admitting: Physician Assistant

## 2019-06-26 ENCOUNTER — Encounter: Payer: Medicare HMO | Admitting: Internal Medicine

## 2019-07-04 ENCOUNTER — Ambulatory Visit (INDEPENDENT_AMBULATORY_CARE_PROVIDER_SITE_OTHER): Payer: Medicare HMO | Admitting: Internal Medicine

## 2019-07-04 ENCOUNTER — Encounter: Payer: Self-pay | Admitting: Internal Medicine

## 2019-07-04 ENCOUNTER — Other Ambulatory Visit: Payer: Self-pay

## 2019-07-04 VITALS — BP 122/68 | HR 66 | Temp 98.0°F | Resp 16 | Ht 71.0 in | Wt 190.0 lb

## 2019-07-04 DIAGNOSIS — Z0000 Encounter for general adult medical examination without abnormal findings: Secondary | ICD-10-CM

## 2019-07-04 DIAGNOSIS — G4762 Sleep related leg cramps: Secondary | ICD-10-CM | POA: Diagnosis not present

## 2019-07-04 DIAGNOSIS — E118 Type 2 diabetes mellitus with unspecified complications: Secondary | ICD-10-CM | POA: Diagnosis not present

## 2019-07-04 DIAGNOSIS — K7581 Nonalcoholic steatohepatitis (NASH): Secondary | ICD-10-CM

## 2019-07-04 DIAGNOSIS — E785 Hyperlipidemia, unspecified: Secondary | ICD-10-CM

## 2019-07-04 DIAGNOSIS — R160 Hepatomegaly, not elsewhere classified: Secondary | ICD-10-CM

## 2019-07-04 LAB — LIPID PANEL
Cholesterol: 110 mg/dL (ref 0–200)
HDL: 45.6 mg/dL (ref >39.00)
LDL Cholesterol: 54 mg/dL (ref 0–99)
NonHDL: 64
Total CHOL/HDL Ratio: 2
Triglycerides: 50 mg/dL (ref 0.0–149.0)
VLDL: 10 mg/dL (ref 0.0–40.0)

## 2019-07-04 LAB — PROTIME-INR
INR: 1 ratio (ref 0.8–1.0)
Prothrombin Time: 11.7 s (ref 9.6–13.1)

## 2019-07-04 LAB — CBC WITH DIFFERENTIAL/PLATELET
Basophils Absolute: 0.1 10*3/uL (ref 0.0–0.1)
Basophils Relative: 1 % (ref 0.0–3.0)
Eosinophils Absolute: 0.2 10*3/uL (ref 0.0–0.7)
Eosinophils Relative: 3.6 % (ref 0.0–5.0)
HCT: 40.3 % (ref 39.0–52.0)
Hemoglobin: 13.7 g/dL (ref 13.0–17.0)
Lymphocytes Relative: 33.8 % (ref 12.0–46.0)
Lymphs Abs: 2.1 10*3/uL (ref 0.7–4.0)
MCHC: 33.9 g/dL (ref 30.0–36.0)
MCV: 92 fl (ref 78.0–100.0)
Monocytes Absolute: 0.7 10*3/uL (ref 0.1–1.0)
Monocytes Relative: 11.1 % (ref 3.0–12.0)
Neutro Abs: 3.1 10*3/uL (ref 1.4–7.7)
Neutrophils Relative %: 50.5 % (ref 43.0–77.0)
Platelets: 155 10*3/uL (ref 150.0–400.0)
RBC: 4.38 Mil/uL (ref 4.22–5.81)
RDW: 13.8 % (ref 11.5–15.5)
WBC: 6.1 10*3/uL (ref 4.0–10.5)

## 2019-07-04 LAB — BASIC METABOLIC PANEL
BUN: 16 mg/dL (ref 6–23)
CO2: 30 mEq/L (ref 19–32)
Calcium: 9.4 mg/dL (ref 8.4–10.5)
Chloride: 104 mEq/L (ref 96–112)
Creatinine, Ser: 0.93 mg/dL (ref 0.40–1.50)
GFR: 78.96 mL/min (ref >60.00)
Glucose, Bld: 153 mg/dL — ABNORMAL HIGH (ref 70–99)
Potassium: 5.1 mEq/L (ref 3.5–5.1)
Sodium: 138 mEq/L (ref 135–145)

## 2019-07-04 LAB — URINALYSIS, ROUTINE W REFLEX MICROSCOPIC
Bilirubin Urine: NEGATIVE
Hgb urine dipstick: NEGATIVE
Ketones, ur: NEGATIVE
Leukocytes,Ua: NEGATIVE
Nitrite: NEGATIVE
RBC / HPF: NONE SEEN (ref 0–?)
Specific Gravity, Urine: 1.02 (ref 1.000–1.030)
Total Protein, Urine: NEGATIVE
Urine Glucose: NEGATIVE
Urobilinogen, UA: 1 (ref 0.0–1.0)
WBC, UA: NONE SEEN (ref 0–?)
pH: 7.5 (ref 5.0–8.0)

## 2019-07-04 LAB — CK: Total CK: 77 U/L (ref 7–232)

## 2019-07-04 LAB — TSH: TSH: 1.72 u[IU]/mL (ref 0.35–4.50)

## 2019-07-04 LAB — MICROALBUMIN / CREATININE URINE RATIO
Creatinine,U: 96.7 mg/dL
Microalb Creat Ratio: 0.7 mg/g (ref 0.0–30.0)
Microalb, Ur: <0.7 mg/dL (ref 0.0–1.9)

## 2019-07-04 LAB — HEPATIC FUNCTION PANEL
ALT: 31 U/L (ref 0–53)
AST: 28 U/L (ref 0–37)
Albumin: 4.5 g/dL (ref 3.5–5.2)
Alkaline Phosphatase: 90 U/L (ref 39–117)
Bilirubin, Direct: 0.2 mg/dL (ref 0.0–0.3)
Total Bilirubin: 0.7 mg/dL (ref 0.2–1.2)
Total Protein: 7 g/dL (ref 6.0–8.3)

## 2019-07-04 LAB — HEMOGLOBIN A1C: Hgb A1c MFr Bld: 7.3 % — ABNORMAL HIGH (ref 4.6–6.5)

## 2019-07-04 MED ORDER — MAGNESIUM OXIDE -MG SUPPLEMENT 200 MG PO TABS: 1.0000 | ORAL_TABLET | Freq: Every day | ORAL | 1 refills | Status: DC

## 2019-07-04 MED ORDER — MAGNESIUM OXIDE -MG SUPPLEMENT 250 MG PO TABS: 1.0000 | ORAL_TABLET | Freq: Every day | ORAL | 1 refills | Status: DC

## 2019-07-04 NOTE — Patient Instructions (Signed)

## 2019-07-04 NOTE — Progress Notes (Signed)
Subjective:  Patient ID: Angel Rodgers, male    DOB: [birth year]  Age: 74 y.o. MRN: [id number]  CC: Annual Exam, Hyperlipidemia, and Diabetes   This visit occurred during the SARS-CoV-2 public health emergency.  Safety protocols were in place, including screening questions prior to the visit, additional usage of staff PPE, and extensive cleaning of exam room while observing appropriate contact time as indicated for disinfecting solutions.    HPI Angel Rodgers presents for a CPX.  He complains of nocturnal leg cramps.  He is active and does not experience any cramping or claudication during the day.  He tells me his blood sugar is well controlled.  He denies polys.  He is active and denies any recent episodes of chest pain or shortness of breath.  Outpatient Medications Prior to Visit  Medication Sig Dispense Refill  . ACCU-CHEK AVIVA PLUS test strip CHECK BLOOD SUGAR TWICE DAILY 200 strip 2  . Alcohol Swabs (B-D SINGLE USE SWABS REGULAR) PADS Use as directed top check blood sugars dx E11.8 100 each 3  . Blood Glucose Calibration (ACCU-CHEK SMARTVIEW CONTROL) LIQD Use BID dx e11.8 3 each 3  . Blood Glucose Monitoring Suppl (ACCU-CHEK AVIVA PLUS) w/Device KIT USE AS DIRECTED 1 kit 2  . Lancets (ACCU-CHEK SOFT TOUCH) lancets Use to check blood sugar twice daily. DX: E11.9 200 each 3  . metFORMIN (GLUCOPHAGE) 1000 MG tablet TAKE 1 TABLET TWICE DAILY WITH A MEAL. Annual appt is due in must see provider for future refills 180 tablet 0  . pantoprazole (PROTONIX) 40 MG tablet TAKE 1 TABLET EVERY DAY 90 tablet 0  . pioglitazone (ACTOS) 15 MG tablet Take 1 tablet (15 mg total) by mouth daily. Annual appt is due in must see provider for future refills 90 tablet 0  . Probiotic Product (PROBIOTIC DAILY PO) Take by mouth.    . simvastatin (ZOCOR) 20 MG tablet TAKE 1 TABLET (20 MG TOTAL) BY MOUTH AT BEDTIME. 90 tablet 0  . aspirin EC 81 MG tablet Take 81 mg by mouth daily.     No facility-administered  medications prior to visit.    ROS Review of Systems  Constitutional: Negative.  Negative for appetite change, diaphoresis, fatigue and unexpected weight change.  HENT: Negative.   Eyes: Negative for visual disturbance.  Respiratory: Negative for cough, chest tightness, shortness of breath and wheezing.   Cardiovascular: Negative for chest pain, palpitations and leg swelling.  Gastrointestinal: Negative for abdominal pain, constipation, diarrhea, nausea and vomiting.  Endocrine: Negative for polydipsia, polyphagia and polyuria.  Genitourinary: Negative.  Negative for difficulty urinating, dysuria, hematuria and urgency.  Musculoskeletal: Positive for myalgias. Negative for arthralgias.  Skin: Negative.  Negative for color change.  Neurological: Negative for dizziness, weakness, light-headedness and headaches.  Hematological: Negative for adenopathy. Does not bruise/bleed easily.  Psychiatric/Behavioral: Negative.     Objective:  BP 122/68 (BP Location: Left Arm, Patient Position: Sitting, Cuff Size: Large)   Pulse 66   Temp 98 F (36.7 C) (Oral)   Resp 16   Ht _0  (1.803 m)   Wt 190 lb (86.2 kg)   SpO2 97%   BMI 26.50 kg/m   BP Readings from Last 3 Encounters:  07/04/19 122/68  05/18/19 120/78  07/05/18 (!) 107/53    Wt Readings from Last 3 Encounters:  07/04/19 190 lb (86.2 kg)  05/18/19 196 lb 9.6 oz (89.2 kg)  07/05/18 184 lb (83.5 kg)    Physical Exam Vitals reviewed.  Constitutional:  Appearance: Normal appearance.  HENT:     Nose: Nose normal.     Mouth/Throat:     Mouth: Mucous membranes are moist.  Eyes:     General: No scleral icterus.    Conjunctiva/sclera: Conjunctivae normal.  Cardiovascular:     Rate and Rhythm: Normal rate and regular rhythm.     Heart sounds: No murmur.  Pulmonary:     Effort: Pulmonary effort is normal.     Breath sounds: No stridor. No wheezing, rhonchi or rales.  Abdominal:     General: Abdomen is flat. Bowel  sounds are normal. There is no distension.     Palpations: Abdomen is soft. There is no hepatomegaly, splenomegaly or mass.     Tenderness: There is no abdominal tenderness.  Musculoskeletal:        General: Normal range of motion.     Cervical back: Neck supple.     Right lower leg: No edema.     Left lower leg: No edema.  Lymphadenopathy:     Cervical: No cervical adenopathy.  Skin:    General: Skin is warm and dry.     Coloration: Skin is not pale.  Neurological:     General: No focal deficit present.     Mental Status: He is alert and oriented to person, place, and time. Mental status is at baseline.  Psychiatric:        Mood and Affect: Mood normal.        Behavior: Behavior normal.     Lab Results  Component Value Date   WBC 6.1 07/04/2019   HGB 13.7 07/04/2019   HCT 40.3 07/04/2019   PLT 155.0 07/04/2019   GLUCOSE 153 (H) 07/04/2019   CHOL 110 07/04/2019   TRIG 50.0 07/04/2019   HDL 45.60 07/04/2019   LDLDIRECT 169.8 02/21/2010   LDLCALC 54 07/04/2019   ALT 31 07/04/2019   AST 28 07/04/2019   NA 138 07/04/2019   K 5.1 07/04/2019   CL 104 07/04/2019   CREATININE 0.93 07/04/2019   BUN 16 07/04/2019   CO2 30 07/04/2019   TSH 1.72 07/04/2019   PSA 0.46 06/22/2018   INR 1.0 07/04/2019   HGBA1C 7.3 (H) 07/04/2019   MICROALBUR <0.7 07/04/2019    VAS [place] AAA DUPLEX  Result Date: 04/20/2019 ABDOMINAL AORTA STUDY Indications: Follow up exam for known AAA. Risk Factors: Hyperlipidemia, Diabetes, past history of smoking. Limitations: Air/bowel gas.  Comparison Study: 03/26/2017 - [facility]                   Abdomen Complete Ultrasound: 3 cm Aorta noted. Performing Technologist: Caralee Ates BA, RVT, RDMS  Examination Guidelines: A complete evaluation includes B-mode imaging, spectral Doppler, color Doppler, and power Doppler as needed of all accessible portions of each vessel. Bilateral testing is considered an integral part of a  complete examination. Limited examinations for reoccurring indications may be performed as noted.  Abdominal Aorta Findings: +-----------+-------+----------+----------+--------+--------+--------+ Location   AP (cm)Trans (cm)PSV (cm/s)WaveformThrombusComments +-----------+-------+----------+----------+--------+--------+--------+ Supraceliac2.76   2.70      60        biphasic                 +-----------+-------+----------+----------+--------+--------+--------+ Proximal   2.73   2.76      50        biphasic                 +-----------+-------+----------+----------+--------+--------+--------+ Mid  2.38   2.63      79        biphasic                 +-----------+-------+----------+----------+--------+--------+--------+ Distal     3.04   3.31      50        biphasic                 +-----------+-------+----------+----------+--------+--------+--------+ RT CIA Prox1.4    1.5       77        biphasic                 +-----------+-------+----------+----------+--------+--------+--------+ RT CIA Mid 1.4    1.5       102       biphasic                 +-----------+-------+----------+----------+--------+--------+--------+ Visualization of the Super[place]iac artery and Proximal Abdominal Aorta was limited.  Summary: Abdominal Aorta: There is evidence of abnormal dilatation of the distal Abdominal aorta. The largest aortic measurement is 3.3 cm.  *See table(s) above for measurements and observations.  Electronically signed by Ruta Hinds MD on 04/20/2019 at 8:31:47 AM.   Final    IMPRESSION: 1. Diffuse hepatic steatosis. 2. 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 The only unusual feature of this lesion is the hypoechogenicity on ultrasound, but this might be explained given the degree of fatty infiltration. The lesion did have enhanced through transmission on ultrasound, which is characteristic of  hemangioma. This lesion requires no further workup. 3. Multiple tiny gallstones settle dependently in the gallbladder.   Electronically Signed   By: Van Clines M.D.   On: 04/10/2017 13:19   Assessment & Plan:   Bawi was seen today for annual exam, hyperlipidemia and diabetes.  Diagnoses and all orders for this visit:  Type 2 diabetes mellitus with manifestations ([place])- His A1c is down to 7.3%.  His blood sugar is adequately well controlled.  Will continue the current glycemic agents. -     CBC with Differential/Platelet; Future -     Basic metabolic panel; Future -     Hemoglobin A1c; Future -     Urinalysis, Routine w reflex microscopic; Future -     Microalbumin / creatinine urine ratio; Future -     Ambulatory referral to Ophthalmology -     HM Diabetes Foot Exam -     Microalbumin / creatinine urine ratio -     Urinalysis, Routine w reflex microscopic -     Basic metabolic panel -     Hemoglobin A1c -     CBC with Differential/Platelet  Hyperlipidemia with target LDL less than 70- He has achieved his LDL goal.  His CK level is normal.  I do not think his nocturnal leg cramps are related to the statin. -     Lipid panel; Future -     TSH; Future -     CK; Future -     CK -     TSH -     Lipid panel  Routine health maintenance- Exam completed, labs reviewed, vaccines reviewed and updated, no cancer screenings are indicated, patient education was given.  Liver mass, right lobe- This was seen on MRI 2 years ago.  It is consistent with a hemangioma.  He is asymptomatic and his liver enzymes are normal.  I do not think any  further imaging is needed at this time. -     Hepatic function panel; Future -     Protime-INR; Future -     Protime-INR -     Hepatic function panel  NASH (nonalcoholic steatohepatitis)- His LFTs have improved.  Will continue the current dose of pioglitazone. -     Hepatic function panel; Future -     Protime-INR; Future -      Protime-INR -     Hepatic function panel  Nocturnal leg cramps- I think he would benefit from taking a dose of magnesium at bedtime.  I also recommended that he consume a tablespoon of yellow mustard at bedtime. -     CK; Future -     Discontinue: Magnesium Oxide 200 MG TABS; Take 1 tablet (200 mg total) by mouth at bedtime. -     CK -     Magnesium Oxide -Mg Supplement ([employer] MAGNESIUM OXIDE) 250 MG TABS; Take 1 tablet (250 mg total) by mouth daily.   I have discontinued Effie Steinhoff's aspirin EC and Magnesium Oxide. I am also having him start on Magnesium Oxide -Mg Supplement. Additionally, I am having him maintain his B-D SINGLE USE SWABS REGULAR, Accu-Chek SmartView Control, accu-chek soft touch, Probiotic Product (PROBIOTIC DAILY PO), Accu-Chek Aviva Plus, simvastatin, Accu-Chek Aviva Plus, metFORMIN, pioglitazone, and pantoprazole.  Meds ordered this encounter  Medications  . DISCONTD: Magnesium Oxide 200 MG TABS    Sig: Take 1 tablet (200 mg total) by mouth at bedtime.    Dispense:  90 tablet    Refill:  1  . Magnesium Oxide -Mg Supplement ([employer] MAGNESIUM OXIDE) 250 MG TABS    Sig: Take 1 tablet (250 mg total) by mouth daily.    Dispense:  90 tablet    Refill:  1   In addition to time spent on CPE, I spent 60 minutes in preparing to see the patient by review of recent labs, imaging and procedures, obtaining and reviewing separately obtained history, communicating with the patient and family or caregiver, ordering medications, tests or procedures, and documenting clinical information in the EHR including the differential Dx, treatment, and any further evaluation and other management of 1. Type 2 diabetes mellitus with manifestations ([place]) 2. Hyperlipidemia with target LDL less than 70 3. Liver mass, right lobe 4. NASH (nonalcoholic steatohepatitis) 5. Nocturnal leg cramps    Follow-up: Return in about 6 months (around 01/04/2020).  Scarlette Calico, MD

## 2019-07-05 ENCOUNTER — Encounter: Payer: Self-pay | Admitting: Internal Medicine

## 2019-07-11 ENCOUNTER — Encounter: Payer: Self-pay | Admitting: Internal Medicine

## 2019-07-17 ENCOUNTER — Other Ambulatory Visit: Payer: Self-pay | Admitting: Internal Medicine

## 2019-07-17 DIAGNOSIS — E118 Type 2 diabetes mellitus with unspecified complications: Secondary | ICD-10-CM

## 2019-07-17 NOTE — Telephone Encounter (Signed)
Please refill as per office routine med refill policy (all routine meds refilled for 3 mo or monthly per pt preference up to one year from last visit, then month to month grace period for 3 mo, then further med refills will have to be denied)  

## 2019-07-24 ENCOUNTER — Other Ambulatory Visit: Payer: Self-pay

## 2019-07-24 ENCOUNTER — Ambulatory Visit (AMBULATORY_SURGERY_CENTER): Payer: Medicare HMO | Admitting: *Deleted

## 2019-07-24 VITALS — Ht 71.0 in | Wt 185.0 lb

## 2019-07-24 DIAGNOSIS — Z86010 Personal history of colonic polyps: Secondary | ICD-10-CM

## 2019-07-24 MED ORDER — NA SULFATE-K SULFATE-MG SULF 17.5-3.13-1.6 GM/177ML PO SOLN: ORAL | 0 refills | Status: DC

## 2019-07-24 NOTE — Progress Notes (Signed)
Patient's pre-visit was done today over the phone with the patient due to COVID-19 pandemic. Name,DOB and address verified. Insurance verified. Packet of Prep instructions mailed to patient including copy of a consent form and pre-procedure patient acknowledgement form-pt is aware. Patient understands to call us back with any questions or concerns. Pt completed both covid vaccines 03/23/2019. Pt is aware that care partner will wait in the car during procedure; if they feel like they will be too hot or cold to wait in the car; they may wait in the 4 th floor lobby. Patient is aware to bring only one care partner. We want them to wear a mask (we do not have any that we can provide them), practice social distancing, and we will check their temperatures when they get here.  I did remind the patient that their care partner needs to stay in the parking lot the entire time and have a cell phone available, we will call them when the pt is ready for discharge. Patient will wear mask into building.

## 2019-08-08 ENCOUNTER — Ambulatory Visit (AMBULATORY_SURGERY_CENTER): Payer: Medicare HMO | Admitting: Internal Medicine

## 2019-08-08 ENCOUNTER — Encounter: Payer: Self-pay | Admitting: Internal Medicine

## 2019-08-08 ENCOUNTER — Other Ambulatory Visit: Payer: Self-pay

## 2019-08-08 VITALS — BP 135/71 | HR 55 | Temp 96.2°F | Resp 13 | Ht 71.0 in | Wt 185.0 lb

## 2019-08-08 DIAGNOSIS — D122 Benign neoplasm of ascending colon: Secondary | ICD-10-CM

## 2019-08-08 DIAGNOSIS — Z86010 Personal history of colonic polyps: Secondary | ICD-10-CM

## 2019-08-08 DIAGNOSIS — E119 Type 2 diabetes mellitus without complications: Secondary | ICD-10-CM | POA: Diagnosis not present

## 2019-08-08 DIAGNOSIS — K219 Gastro-esophageal reflux disease without esophagitis: Secondary | ICD-10-CM | POA: Diagnosis not present

## 2019-08-08 LAB — HM COLONOSCOPY

## 2019-08-08 MED ORDER — SODIUM CHLORIDE 0.9 % IV SOLN: 500.0000 mL | Freq: Once | INTRAVENOUS | Status: DC

## 2019-08-08 NOTE — Progress Notes (Signed)
Pt's states no medical or surgical changes since previsit or office visit. 

## 2019-08-08 NOTE — Op Note (Signed)
[facility] Patient Name: Angel Rodgers Procedure Date: 08/08/2019 9:15 AM MRN: [id number] Endoscopist: Jerene Bears , MD Age: 74 Referring MD:  Date of Birth: April 18, 1945 Gender: Male Account #: [id number] Procedure:                Colonoscopy Indications:              High risk colon cancer surveillance: Personal                            history of non-advanced adenoma, Last colonoscopy:                            May 2016 Medicines:                Monitored Anesthesia Care Procedure:                Pre-Anesthesia Assessment:                           - Prior to the procedure, a History and Physical                            was performed, and patient medications and                            allergies were reviewed. The patient's tolerance of                            previous anesthesia was also reviewed. The risks                            and benefits of the procedure and the sedation                            options and risks were discussed with the patient.                            All questions were answered, and informed consent                            was obtained. Prior Anticoagulants: The patient has                            taken no previous anticoagulant or antiplatelet                            agents. ASA Grade Assessment: II - A patient with                            mild systemic disease. After reviewing the risks                            and benefits, the patient was deemed in  satisfactory condition to undergo the procedure.                           After obtaining informed consent, the colonoscope                            was passed under direct vision. Throughout the                            procedure, the patient's blood pressure, pulse, and                            oxygen saturations were monitored continuously. The                            Colonoscope was introduced through the anus and                             advanced to the cecum, identified by appendiceal                            orifice and ileocecal valve. The colonoscopy was                            performed without difficulty. The patient tolerated                            the procedure well. The quality of the bowel                            preparation was good. The ileocecal valve,                            appendiceal orifice, and rectum were photographed. Scope In: 9:31:27 AM Scope Out: 9:57:14 AM Scope Withdrawal Time: 0 hours 20 minutes 7 seconds  Total Procedure Duration: 0 hours 25 minutes 47 seconds  Findings:                 The digital rectal exam was normal.                           A 5 mm polyp was found in the distal ascending                            colon. The polyp was sessile. The polyp was removed                            with a cold snare. Resection and retrieval were                            complete.                           A 9 mm polyp was found in the distal ascending  colon. The polyp was sessile. The polyp was removed                            with a cold snare. Resection and retrieval were                            complete. To stop active bleeding, one hemostatic                            clip was successfully placed. There was no bleeding                            at the end of the maneuver.                           Internal hemorrhoids were found during                            retroflexion. The hemorrhoids were small. Complications:            No immediate complications. Estimated Blood Loss:     Estimated blood loss was minimal. Impression:               - One 5 mm polyp in the distal ascending colon,                            removed with a cold snare. Resected and retrieved.                           - One 9 mm polyp in the distal ascending colon,                            removed with a cold snare. Resected and retrieved.                             Clip was placed.                           - Small internal hemorrhoids. Recommendation:           - Patient has a contact number available for                            emergencies. The signs and symptoms of potential                            delayed complications were discussed with the                            patient. Return to normal activities tomorrow.                            Written discharge instructions were provided to the  patient.                           - Resume previous diet.                           - Continue present medications.                           - Await pathology results.                           - Repeat colonoscopy may be recommended for                            surveillance. The colonoscopy date will be                            determined after pathology results from today's                            exam become available for review. Jerene Bears, MD 08/08/2019 10:02:25 AM This report has been signed electronically.

## 2019-08-08 NOTE — Progress Notes (Signed)
Called to room to assist during endoscopic procedure.  Patient ID and intended procedure confirmed with present staff. Received instructions for my participation in the procedure from the performing physician.  

## 2019-08-08 NOTE — Patient Instructions (Signed)
You do have a clip in your colon. No MRI's.  You do have a card in your discharge instructions.  Keep the card in your pocket.  Read all of the handouts given to you by your recovery room nurse.  Thank-you for choosing [place] for your healthcare needs today.  YOU HAD AN ENDOSCOPIC PROCEDURE TODAY AT [facility]:   Refer to the procedure report that was given to you for any specific questions about what was found during the examination.  If the procedure report does not answer your questions, please call your gastroenterologist to clarify.  If you requested that your care partner not be given the details of your procedure findings, then the procedure report has been included in a sealed envelope for you to review at your convenience later.  YOU SHOULD EXPECT: Some feelings of bloating in the abdomen. Passage of more gas than usual.  Walking can help get rid of the air that was put into your GI tract during the procedure and reduce the bloating. If you had a lower endoscopy (such as a colonoscopy or flexible sigmoidoscopy) you may notice spotting of blood in your stool or on the toilet paper. If you underwent a bowel prep for your procedure, you may not have a normal bowel movement for a few days.  Please Note:  You might notice some irritation and congestion in your nose or some drainage.  This is from the oxygen used during your procedure.  There is no need for concern and it should clear up in a day or so.  SYMPTOMS TO REPORT IMMEDIATELY:   Following lower endoscopy (colonoscopy or flexible sigmoidoscopy):  Excessive amounts of blood in the stool  Significant tenderness or worsening of abdominal pains  Swelling of the abdomen that is new, acute  Fever of 100F or higher   For urgent or emergent issues, a gastroenterologist can be reached at any hour by calling [phone]. Do not use MyChart messaging for urgent concerns.    DIET:  We do recommend a small meal at first, but  then you may proceed to your regular diet.  Drink plenty of fluids but you should avoid alcoholic beverages for 24 hours.  ACTIVITY:  You should plan to take it easy for the rest of today and you should NOT DRIVE or use heavy machinery until tomorrow (because of the sedation medicines used during the test).    FOLLOW UP: Our staff will call the number listed on your records 48-72 hours following your procedure to check on you and address any questions or concerns that you may have regarding the information given to you following your procedure. If we do not reach you, we will leave a message.  We will attempt to reach you two times.  During this call, we will ask if you have developed any symptoms of COVID 19. If you develop any symptoms (ie: fever, flu-like symptoms, shortness of breath, cough etc.) before then, please call [phone].  If you test positive for Covid 19 in the 2 weeks post procedure, please call and report this information to [place].    If any biopsies were taken you will be contacted by phone or by letter within the next 1-3 weeks.  Please call us at [phone] if you have not heard about the biopsies in 3 weeks.    SIGNATURES/CONFIDENTIALITY: You and/or your care partner have signed paperwork which will be entered into your electronic medical record.  These signatures attest to  the fact that that the information above on your After Visit Summary has been reviewed and is understood.  Full responsibility of the confidentiality of this discharge information lies with you and/or your care-partner.

## 2019-08-08 NOTE — Progress Notes (Signed)
Report to PACU, RN, vss, BBS= Clear.  

## 2019-08-10 ENCOUNTER — Telehealth: Payer: Self-pay | Admitting: *Deleted

## 2019-08-10 NOTE — Telephone Encounter (Signed)
  Follow up Call-  Call back number 08/08/2019 07/05/2018  Post procedure Call Back phone  # [phone]  Permission to leave phone message Yes Yes  Some recent data might be hidden     Patient questions:  Do you have a fever, pain , or abdominal swelling? No. Pain Score  0 *  Have you tolerated food without any problems? Yes.    Have you been able to return to your normal activities? Yes.    Do you have any questions about your discharge instructions: Diet   No. Medications  No. Follow up visit  No.  Do you have questions or concerns about your Care? No.  Actions: * If pain score is 4 or above: No action needed, pain <4.  1. Have you developed a fever since your procedure? no  2.   Have you had an respiratory symptoms (SOB or cough) since your procedure? no  3.   Have you tested positive for COVID 19 since your procedure no  4.   Have you had any family members/close contacts diagnosed with the COVID 19 since your procedure?  no   If yes to any of these questions please route to Joylene John, RN and Erenest Rasher, RN

## 2019-08-11 ENCOUNTER — Encounter: Payer: Self-pay | Admitting: Internal Medicine

## 2019-08-25 ENCOUNTER — Other Ambulatory Visit: Payer: Self-pay | Admitting: Physician Assistant

## 2019-12-04 ENCOUNTER — Other Ambulatory Visit: Payer: Self-pay | Admitting: Internal Medicine

## 2019-12-27 ENCOUNTER — Other Ambulatory Visit: Payer: Self-pay | Admitting: Internal Medicine

## 2019-12-27 DIAGNOSIS — E118 Type 2 diabetes mellitus with unspecified complications: Secondary | ICD-10-CM

## 2020-01-12 ENCOUNTER — Other Ambulatory Visit: Payer: Self-pay | Admitting: Internal Medicine

## 2020-01-23 ENCOUNTER — Telehealth: Payer: Self-pay | Admitting: Internal Medicine

## 2020-01-23 NOTE — Progress Notes (Signed)
  Chronic Care Management   Note  01/23/2020 Name: Angel Rodgers MRN: [id number] DOB: 1945-08-05  Angel Rodgers is a 74 y.o. year old male who is a primary care patient of Janith Lima, MD. I reached out to Honor Junes by phone today in response to a referral sent by Angel Rodgers PCP, Janith Lima, MD.   Angel Rodgers was given information about Chronic Care Management services today including:  1. CCM service includes personalized support from designated clinical staff supervised by his physician, including individualized plan of care and coordination with other care providers 2. 24/7 contact phone numbers for assistance for urgent and routine care needs. 3. Service will only be billed when office clinical staff spend 20 minutes or more in a month to coordinate care. 4. Only one practitioner may furnish and bill the service in a calendar month. 5. The patient may stop CCM services at any time (effective at the end of the month) by phone call to the office staff.   Patient wishes to consider information provided and/or speak with a member of the care team before deciding about enrollment in care management services.   Follow up plan:   Carley Perdue [employer] Scheduler

## 2020-02-05 ENCOUNTER — Telehealth: Payer: Self-pay | Admitting: Internal Medicine

## 2020-02-05 NOTE — Progress Notes (Signed)
  Chronic Care Management   Note  02/05/2020 Name: Angel Rodgers MRN: [id number] DOB: [birth year]  [name] is a 74 y.o. year old male who is a primary care patient of Janith Lima, MD. I reached out to Honor Junes by phone today in response to a referral sent by [name] PCP, Janith Lima, MD.   [name] was given information about Chronic Care Management services today including:  1. CCM service includes personalized support from designated clinical staff supervised by his physician, including individualized plan of care and coordination with other care providers 2. 24/7 contact phone numbers for assistance for urgent and routine care needs. 3. Service will only be billed when office clinical staff spend 20 minutes or more in a month to coordinate care. 4. Only one practitioner may furnish and bill the service in a calendar month. 5. The patient may stop CCM services at any time (effective at the end of the month) by phone call to the office staff.   Patient wishes to consider information provided and/or speak with a member of the care team before deciding about enrollment in care management services.   Follow up plan:   Carley Perdue [employer] Scheduler

## 2020-02-11 DIAGNOSIS — K529 Noninfective gastroenteritis and colitis, unspecified: Secondary | ICD-10-CM | POA: Diagnosis not present

## 2020-02-11 DIAGNOSIS — Z20828 Contact with and (suspected) exposure to other viral communicable diseases: Secondary | ICD-10-CM | POA: Diagnosis not present

## 2020-02-12 IMAGING — MR MR ABDOMEN WO/W CM
11 of 17 series · 28 of 48 positions shown · IV contrast (multihance)
Comparison: 03/26/2017

CLINICAL DATA: Elevated liver enzymes. Hypoechoic right hepatic
lobe mass on ultrasound with accentuated through transmission.

EXAM:
MRI ABDOMEN WITHOUT AND WITH CONTRAST
TECHNIQUE: Multiplanar multisequence MR imaging of the abdomen was performed
both before and after the administration of intravenous contrast.
CONTRAST:  18mL MULTIHANCE GADOBENATE DIMEGLUMINE 529 MG/ML IV SOLN

[Series 3: cor haste · coronal · 5.0mm · 0.74mm/px · 2 of 36 slices shown]
[im 1/36]
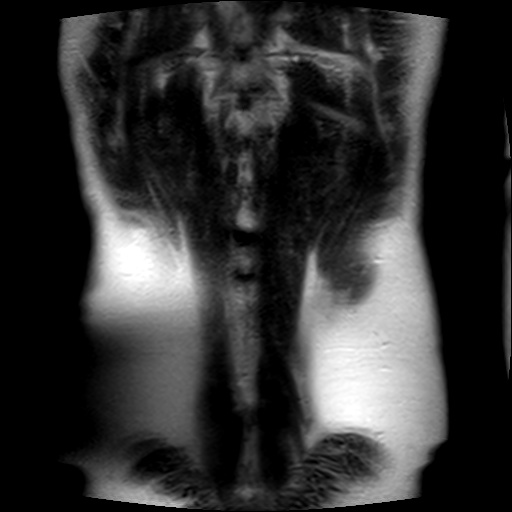
[im 36/36]
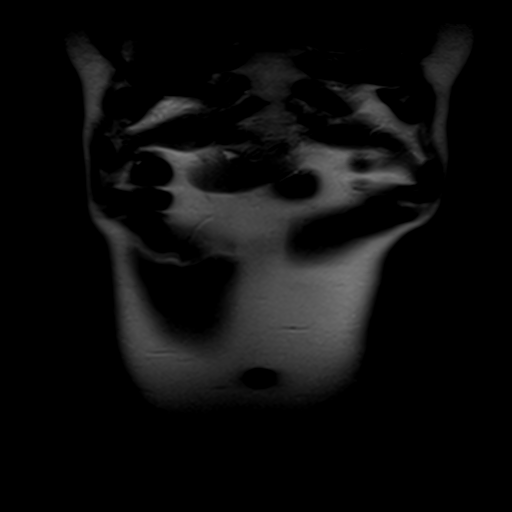

[Series 4: T1 · axial · 6.0mm · 0.74mm/px · z∈[-56,+168]mm · 4 of 70 slices shown]
[im 1/70]
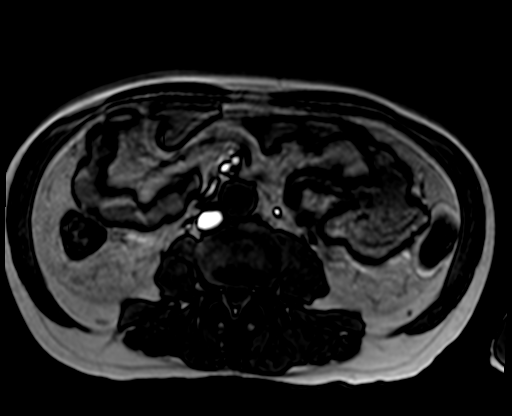
[im 24/70]
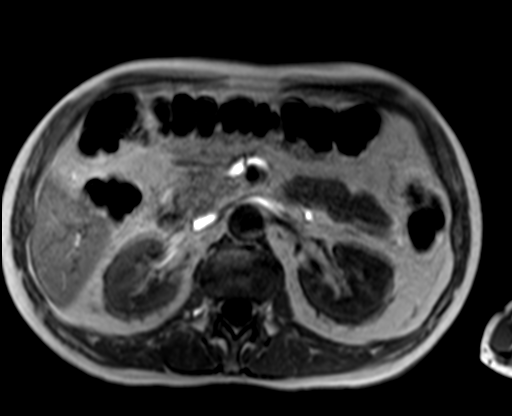
[im 47/70]
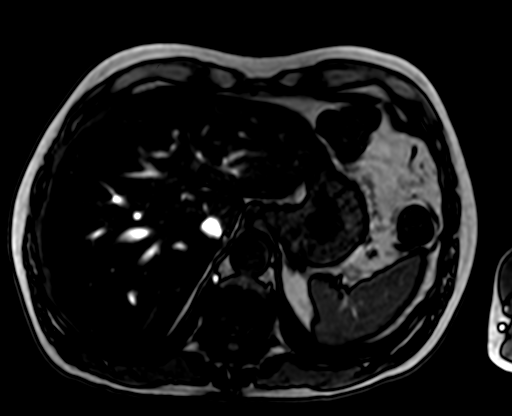
[im 70/70]
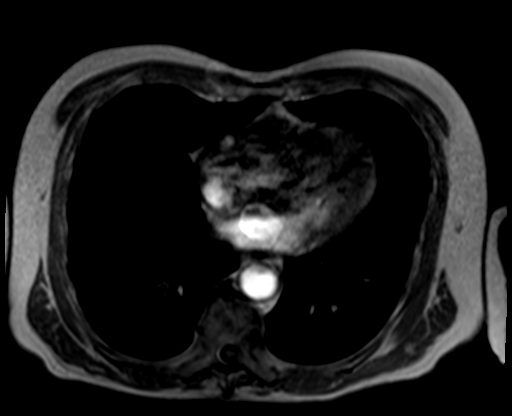

[Series 5: axial haste · axial · 6.0mm · 0.74mm/px · z∈[-63,+162]mm · 2 of 35 slices shown]
[im 1/35]
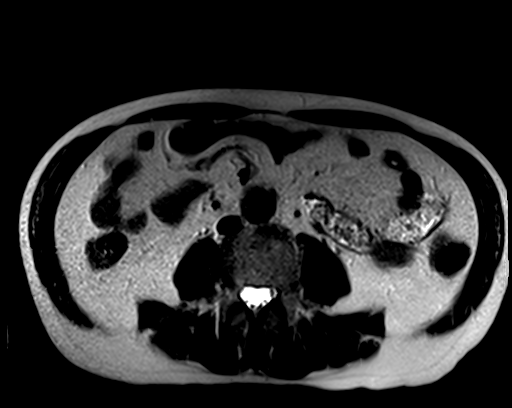
[im 35/35]
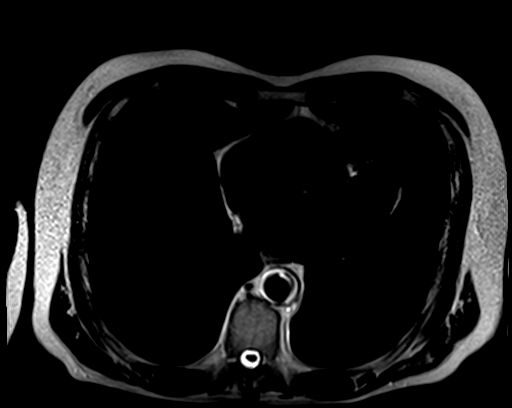

[Series 6: T2 · axial · 6.0mm · 1.12mm/px · z∈[-42,+195]mm · 2 of 34 slices shown]
[im 1/34]
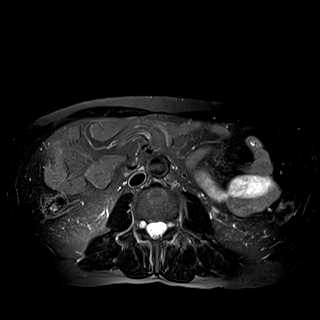
[im 34/34]
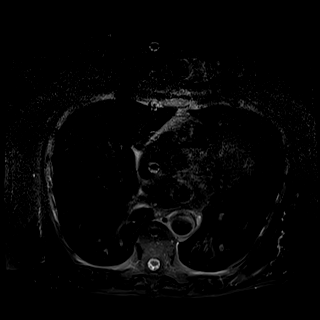

[Series 7: ep2d_diff_b50_500_800_p2_trig · axial · 6.0mm · 1.98mm/px · z∈[-47,+197]mm · 5 of 105 slices shown]
[im 1/105]
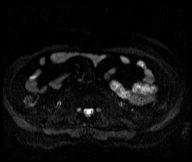
[im 27/105]
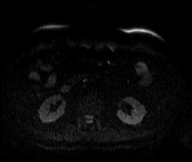
[im 53/105]
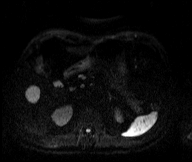
[im 79/105]
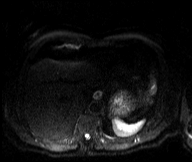
[im 105/105]
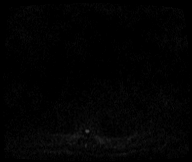

[Series 8: ep2d_[id number]_500_800_p2_trig_adc · axial · 6.0mm · 1.98mm/px · 1 of 35 slices shown]
[im 1/35]
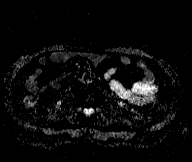

[Series 9: bSSFP · axial · 4.0mm · 0.74mm/px · z∈[-22,+194]mm · 2 of 55 slices shown]
[im 1/55]
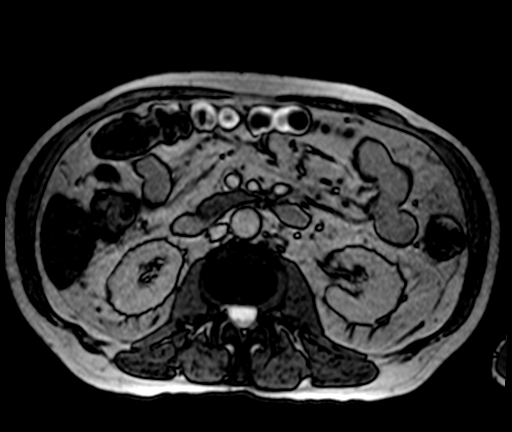
[im 55/55]
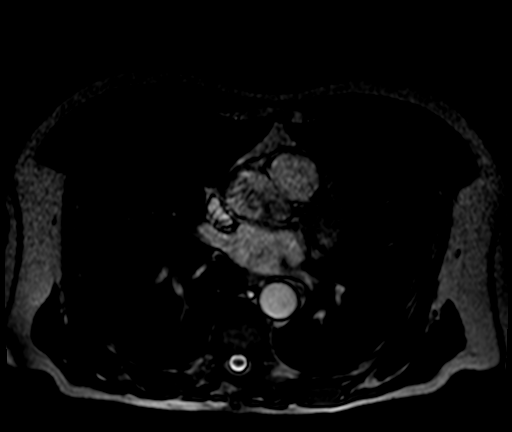

[Series 10: T1 dynamic · axial · non-contrast · 2.5mm · 0.74mm/px · z∈[-69,+169]mm · 3 of 96 slices shown]
[im 1/96]
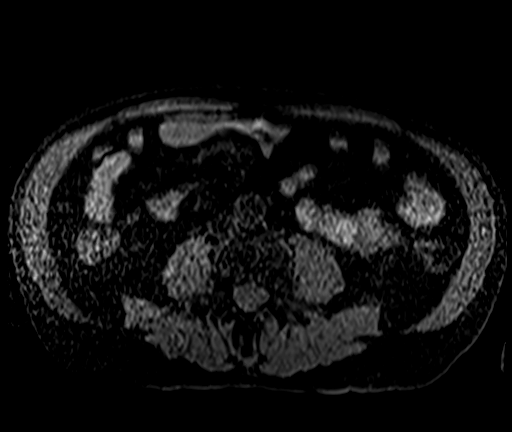
[im 48/96]
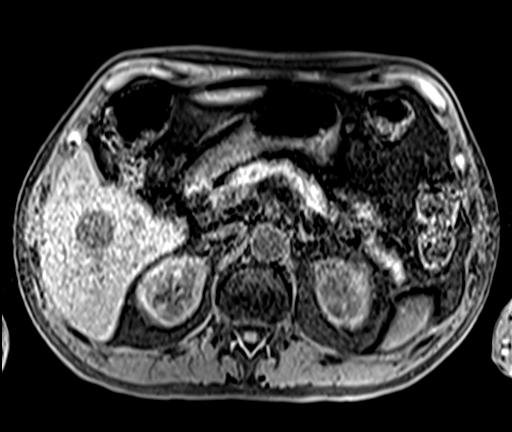
[im 96/96]
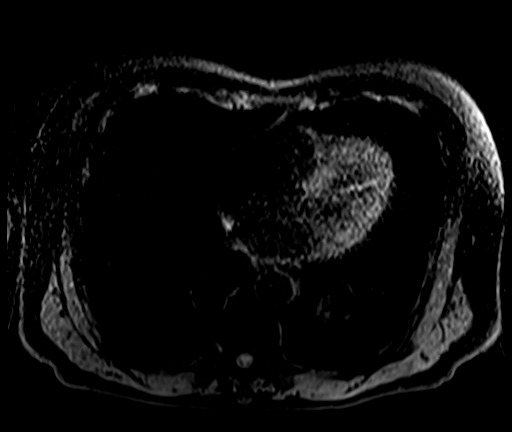

[Series 11: T1 dynamic post-contrast · axial · 2.5mm · 0.74mm/px · z∈[-69,+169]mm · 3 of 96 slices shown (1 of 3)]
[im 1/96]
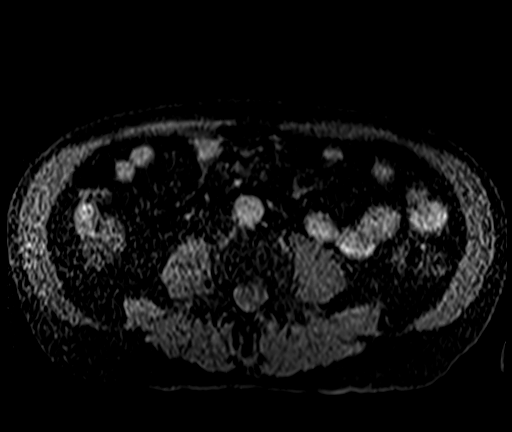
[im 48/96]
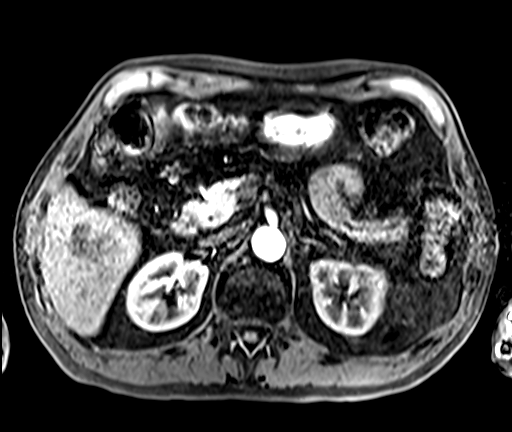
[im 96/96]
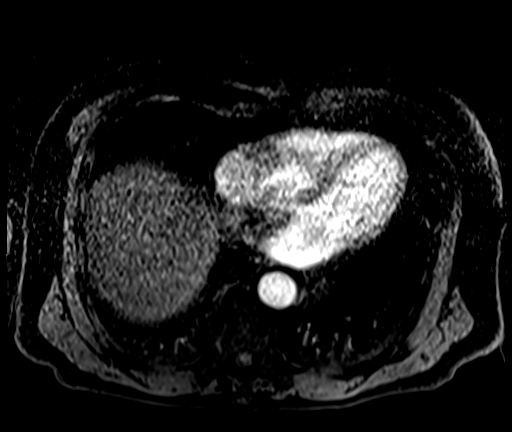

[Series 12: T1 dynamic post-contrast · axial · 2.5mm · 0.74mm/px · z∈[-69,+169]mm · 3 of 96 slices shown (2 of 3)]
[im 1/96]
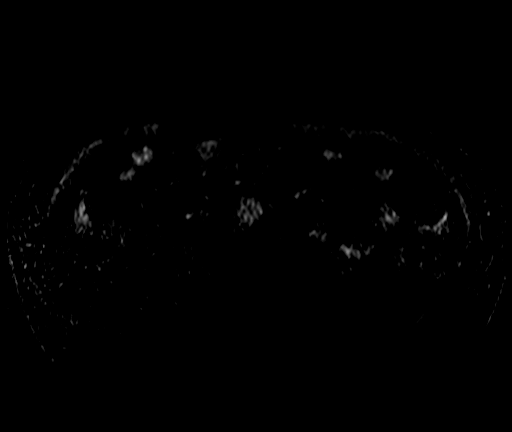
[im 48/96]
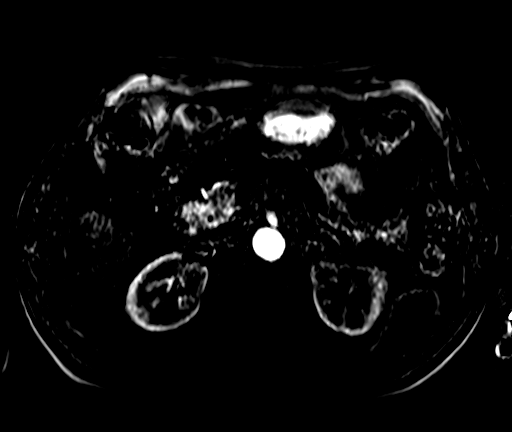
[im 96/96]
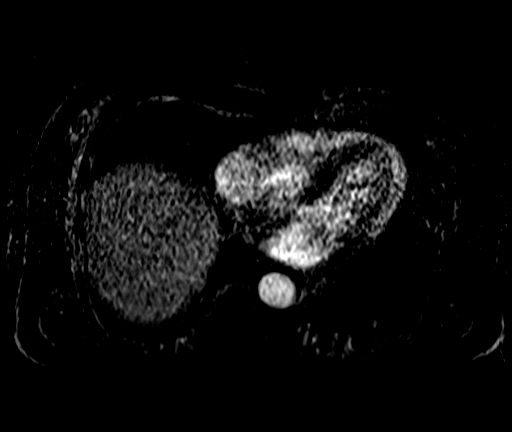

[Series 13: T1 dynamic post-contrast · axial · 2.5mm · 0.74mm/px · 1 of 96 slices shown (3 of 3)]
[im 1/96]
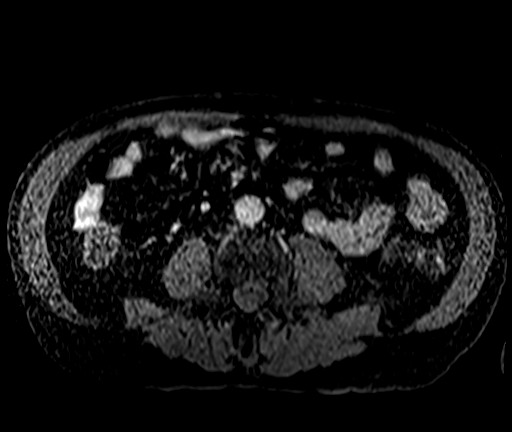

[28 of 48 positions shown; findings below may reference images not displayed]

FINDINGS: Lower chest: Unremarkable

Hepatobiliary: Diffuse hepatic steatosis.

Multiple small gallstones are subtle dependently in the gallbladder.
No biliary dilatation.

Corresponding to the sonographic abnormality there is a 3.6 by
cm sharply defined T2 hyperintense, T1 hypointense lesion in the
right hepatic lobe demonstrating peripheral nodular discontinuous
centripetal enhancement highly characteristic of hepatic hemangioma.
Less than half of this lesion is filled in on the 3 minutes images.

Pancreas:  Unremarkable

Spleen:  Unremarkable

Adrenals/Urinary Tract: Several tiny nonenhancing fluid signal
intensity renal lesions at or below 5 mm in diameter are
statistically likely to be cysts but technically nonspecific due to
small size.

Stomach/Bowel: Unremarkable

Vascular/Lymphatic:  Unremarkable

Other:  No supplemental non-categorized findings.

Musculoskeletal: Mild lumbar degenerative disc disease.
IMPRESSION: 1. Diffuse hepatic steatosis.
2. The 3.6 cm lesion in the right hepatic lobe has classic MRI
findings for a benign hepatic hemangioma. The only unusual feature
of this lesion is the hypoechogenicity on ultrasound, but this might
be explained given the degree of fatty infiltration. The lesion did
have enhanced through transmission on ultrasound, which is
characteristic of hemangioma. This lesion requires no further
workup.
3. Multiple tiny gallstones settle dependently in the gallbladder.

## 2020-03-07 ENCOUNTER — Telehealth: Payer: Self-pay | Admitting: Internal Medicine

## 2020-03-07 NOTE — Progress Notes (Signed)
  Chronic Care Management   Note  03/07/2020 Name: Angel Rodgers MRN: [id number] DOB: Jan 28, 1946  Angel Rodgers is a 75 y.o. year old male who is a primary care patient of Janith Lima, MD. I reached out to Honor Junes by phone today in response to a referral sent by Mr. Geraldine Tesar PCP, Janith Lima, MD.   Mr. Siek was given information about Chronic Care Management services today including:  1. CCM service includes personalized support from designated clinical staff supervised by his physician, including individualized plan of care and coordination with other care providers 2. 24/7 contact phone numbers for assistance for urgent and routine care needs. 3. Service will only be billed when office clinical staff spend 20 minutes or more in a month to coordinate care. 4. Only one practitioner may furnish and bill the service in a calendar month. 5. The patient may stop CCM services at any time (effective at the end of the month) by phone call to the office staff.   Patient wishes to consider information provided and/or speak with a member of the care team before deciding about enrollment in care management services.   Follow up plan:   Carley Perdue [employer] Scheduler

## 2020-05-13 ENCOUNTER — Other Ambulatory Visit: Payer: Self-pay | Admitting: Internal Medicine

## 2020-05-13 DIAGNOSIS — E118 Type 2 diabetes mellitus with unspecified complications: Secondary | ICD-10-CM

## 2020-05-13 NOTE — Telephone Encounter (Signed)
Please refill as per office routine med refill policy (all routine meds refilled for 3 mo or monthly per pt preference up to one year from last visit, then month to month grace period for 3 mo, then further med refills will have to be denied)  

## 2020-05-14 DIAGNOSIS — H5203 Hypermetropia, bilateral: Secondary | ICD-10-CM | POA: Diagnosis not present

## 2020-05-22 ENCOUNTER — Telehealth: Payer: Self-pay | Admitting: Internal Medicine

## 2020-05-22 NOTE — Telephone Encounter (Signed)
I have attempted to call pt to get him scheduled for a follow up visit since he has not been seen since 07/04/19. At that last OV the plan was to follow up in 6 months.  "Follow-up: Return in about 6 months (around 01/04/2020)."  No answer and I left a message to call back.   PLAN: If he does please get pt scheduled for a follow up appt.   Pt is requesting med refills. RX request sent to provider for determination.

## 2020-05-23 NOTE — Telephone Encounter (Signed)
   Appointment scheduled for 5/23 Requesting refill/ short supply

## 2020-05-30 DIAGNOSIS — H35373 Puckering of macula, bilateral: Secondary | ICD-10-CM | POA: Diagnosis not present

## 2020-05-30 DIAGNOSIS — E119 Type 2 diabetes mellitus without complications: Secondary | ICD-10-CM | POA: Diagnosis not present

## 2020-05-30 LAB — HM DIABETES EYE EXAM

## 2020-06-05 ENCOUNTER — Other Ambulatory Visit: Payer: Self-pay | Admitting: Internal Medicine

## 2020-06-05 DIAGNOSIS — E118 Type 2 diabetes mellitus with unspecified complications: Secondary | ICD-10-CM

## 2020-06-05 NOTE — Telephone Encounter (Signed)
Ok to [employer]

## 2020-07-03 ENCOUNTER — Other Ambulatory Visit: Payer: Self-pay

## 2020-07-03 ENCOUNTER — Ambulatory Visit (INDEPENDENT_AMBULATORY_CARE_PROVIDER_SITE_OTHER): Payer: Medicare HMO

## 2020-07-03 VITALS — BP 122/60 | HR 65 | Temp 98.0°F | Ht 71.0 in | Wt 189.2 lb

## 2020-07-03 DIAGNOSIS — Z0000 Encounter for general adult medical examination without abnormal findings: Secondary | ICD-10-CM | POA: Diagnosis not present

## 2020-07-03 NOTE — Patient Instructions (Addendum)
Angel Rodgers , Thank you for taking time to come for your Medicare Wellness Visit. I appreciate your ongoing commitment to your health goals. Please review the following plan we discussed and let me know if I can assist you in the future.   Screening recommendations/referrals: Colonoscopy: 08/08/2019; no repeat due to age Recommended yearly ophthalmology/optometry visit for glaucoma screening and checkup Recommended yearly dental visit for hygiene and checkup  Vaccinations: Influenza vaccine: declined Pneumococcal vaccine: 10/25/2012, 08/21/2013 Tdap vaccine: 03/07/2010 (overdue) Shingles vaccine: Please call your insurance company to determine your out of pocket expense for the Shingrix vaccine. You may receive this vaccine at your local pharmacy.   Covid-19: 02/28/2019, 03/31/2019, 11/29/2020  Advanced directives: Please bring a copy of your health care power of attorney and living will to the office at your convenience.  Conditions/risks identified: Yes; Reviewed health maintenance screenings with patient today and relevant education, vaccines, and/or referrals were provided. Please continue to do your personal lifestyle choices by: daily care of teeth and gums, regular physical activity (goal should be 5 days a week for 30 minutes), eat a healthy diet, avoid tobacco and drug use, limiting any alcohol intake, taking a low-dose aspirin (if not allergic or have been advised by your provider otherwise) and taking vitamins and minerals as recommended by your provider. Continue doing brain stimulating activities (puzzles, reading, adult coloring books, staying active) to keep memory sharp. Continue to eat heart healthy diet (full of fruits, vegetables, whole grains, lean protein, water--limit salt, fat, and sugar intake) and increase physical activity as tolerated.  Next appointment: Please schedule your next Medicare Wellness Visit with your Nurse Health Advisor in 1 year by calling  [phone].  Preventive Care 77 Years and Older, Male Preventive care refers to lifestyle choices and visits with your health care provider that can promote health and wellness. What does preventive care include?  A yearly physical exam. This is also called an annual well check.  Dental exams once or twice a year.  Routine eye exams. Ask your health care provider how often you should have your eyes checked.  Personal lifestyle choices, including:  Daily care of your teeth and gums.  Regular physical activity.  Eating a healthy diet.  Avoiding tobacco and drug use.  Limiting alcohol use.  Practicing safe sex.  Taking low doses of aspirin every day.  Taking vitamin and mineral supplements as recommended by your health care provider. What happens during an annual well check? The services and screenings done by your health care provider during your annual well check will depend on your age, overall health, lifestyle risk factors, and family history of disease. Counseling  Your health care provider may ask you questions about your:  Alcohol use.  Tobacco use.  Drug use.  Emotional well-being.  Home and relationship well-being.  Sexual activity.  Eating habits.  History of falls.  Memory and ability to understand (cognition).  Work and work Statistician. Screening  You may have the following tests or measurements:  Height, weight, and BMI.  Blood pressure.  Lipid and cholesterol levels. These may be checked every 5 years, or more frequently if you are over 46 years old.  Skin check.  Lung cancer screening. You may have this screening every year starting at age 42 if you have a 30-pack-year history of smoking and currently smoke or have quit within the past 15 years.  Fecal occult blood test (FOBT) of the stool. You may have this test every year starting at age  50.  Flexible sigmoidoscopy or colonoscopy. You may have a sigmoidoscopy every 5 years or a  colonoscopy every 10 years starting at age 41.  Prostate cancer screening. Recommendations will vary depending on your family history and other risks.  Hepatitis C blood test.  Hepatitis B blood test.  Sexually transmitted disease (STD) testing.  Diabetes screening. This is done by checking your blood sugar (glucose) after you have not eaten for a while (fasting). You may have this done every 1-3 years.  Abdominal aortic aneurysm (AAA) screening. You may need this if you are a current or former smoker.  Osteoporosis. You may be screened starting at age 60 if you are at high risk. Talk with your health care provider about your test results, treatment options, and if necessary, the need for more tests. Vaccines  Your health care provider may recommend certain vaccines, such as:  Influenza vaccine. This is recommended every year.  Tetanus, diphtheria, and acellular pertussis (Tdap, Td) vaccine. You may need a Td booster every 10 years.  Zoster vaccine. You may need this after age 60.  Pneumococcal 13-valent conjugate (PCV13) vaccine. One dose is recommended after age 42.  Pneumococcal polysaccharide (PPSV23) vaccine. One dose is recommended after age 61. Talk to your health care provider about which screenings and vaccines you need and how often you need them. This information is not intended to replace advice given to you by your health care provider. Make sure you discuss any questions you have with your health care provider. Document Released: 03/01/2015 Document Revised: 10/23/2015 Document Reviewed: 12/04/2014 Elsevier Interactive Patient Education  2017 [employer] Prevention in the Home Falls can cause injuries. They can happen to people of all ages. There are many things you can do to make your home safe and to help prevent falls. What can I do on the outside of my home?  Regularly fix the edges of walkways and driveways and fix any cracks.  Remove anything that  might make you trip as you walk through a door, such as a raised step or threshold.  Trim any bushes or trees on the path to your home.  Use bright outdoor lighting.  Clear any walking paths of anything that might make someone trip, such as rocks or tools.  Regularly check to see if handrails are loose or broken. Make sure that both sides of any steps have handrails.  Any raised decks and porches should have guardrails on the edges.  Have any leaves, snow, or ice cleared regularly.  Use sand or salt on walking paths during winter.  Clean up any spills in your garage right away. This includes oil or grease spills. What can I do in the bathroom?  Use night lights.  Install grab bars by the toilet and in the tub and shower. Do not use towel bars as grab bars.  Use non-skid mats or decals in the tub or shower.  If you need to sit down in the shower, use a plastic, non-slip stool.  Keep the floor dry. Clean up any water that spills on the floor as soon as it happens.  Remove soap buildup in the tub or shower regularly.  Attach bath mats securely with double-sided non-slip rug tape.  Do not have throw rugs and other things on the floor that can make you trip. What can I do in the bedroom?  Use night lights.  Make sure that you have a light by your bed that is easy to reach.  Do not use any sheets or blankets that are too big for your bed. They should not hang down onto the floor.  Have a firm chair that has side arms. You can use this for support while you get dressed.  Do not have throw rugs and other things on the floor that can make you trip. What can I do in the kitchen?  Clean up any spills right away.  Avoid walking on wet floors.  Keep items that you use a lot in easy-to-reach places.  If you need to reach something above you, use a strong step stool that has a grab bar.  Keep electrical cords out of the way.  Do not use floor polish or wax that makes floors  slippery. If you must use wax, use non-skid floor wax.  Do not have throw rugs and other things on the floor that can make you trip. What can I do with my stairs?  Do not leave any items on the stairs.  Make sure that there are handrails on both sides of the stairs and use them. Fix handrails that are broken or loose. Make sure that handrails are as long as the stairways.  Check any carpeting to make sure that it is firmly attached to the stairs. Fix any carpet that is loose or worn.  Avoid having throw rugs at the top or bottom of the stairs. If you do have throw rugs, attach them to the floor with carpet tape.  Make sure that you have a light switch at the top of the stairs and the bottom of the stairs. If you do not have them, ask someone to add them for you. What else can I do to help prevent falls?  Wear shoes that:  Do not have high heels.  Have rubber bottoms.  Are comfortable and fit you well.  Are closed at the toe. Do not wear sandals.  If you use a stepladder:  Make sure that it is fully opened. Do not climb a closed stepladder.  Make sure that both sides of the stepladder are locked into place.  Ask someone to hold it for you, if possible.  Clearly mark and make sure that you can see:  Any grab bars or handrails.  First and last steps.  Where the edge of each step is.  Use tools that help you move around (mobility aids) if they are needed. These include:  Canes.  Walkers.  Scooters.  Crutches.  Turn on the lights when you go into a dark area. Replace any light bulbs as soon as they burn out.  Set up your furniture so you have a clear path. Avoid moving your furniture around.  If any of your floors are uneven, fix them.  If there are any pets around you, be aware of where they are.  Review your medicines with your doctor. Some medicines can make you feel dizzy. This can increase your chance of falling. Ask your doctor what other things that you  can do to help prevent falls. This information is not intended to replace advice given to you by your health care provider. Make sure you discuss any questions you have with your health care provider. Document Released: 11/29/2008 Document Revised: 07/11/2015 Document Reviewed: 03/09/2014 Elsevier Interactive Patient Education  2017 [employer].

## 2020-07-03 NOTE — Progress Notes (Signed)
Subjective:   Angel Rodgers is a 75 y.o. male who presents for Medicare Annual/Subsequent preventive examination.  Review of Systems    No ROS. Medicare Wellness Visit. Additional risk factors are reflected in social history. Cardiac Risk Factors include: advanced age (>18mn, >>38women);diabetes mellitus;dyslipidemia;family history of premature cardiovascular disease;male gender     Objective:    Today's Vitals   07/03/20 1030 07/03/20 1031  BP: 122/60   Pulse: 65   Temp: 98 F (36.7 C)   SpO2: 99%   Weight: 189 lb 3.2 oz (85.8 kg)   Height: 5' 11"  (1.803 m)   PainSc: 0-No pain 0-No pain   Body mass index is 26.39 kg/m.  Advanced Directives 07/03/2020 08/08/2019 05/18/2019 04/18/2018 02/22/2017 03/31/2016 11/30/2015  Does Patient Have a Medical Advance Directive? Yes Yes Yes Yes Yes No No  Type of AParamedicof AM[place]Living will [place]Living will HRockville CentreLiving will [place]Living will [place]Living will - -  Does patient want to make changes to medical advance directive? No - Patient declined No - Patient declined No - Patient declined - - - -  Copy of [place] Cityin Chart? No - copy requested No - copy requested No - copy requested No - copy requested No - copy requested - -  Would patient like information on creating a medical advance directive? - - - - - - No - patient declined information    Current Medications (verified) Outpatient Encounter Medications as of 07/03/2020  Medication Sig  . ACCU-CHEK AVIVA PLUS test strip CHECK BLOOD SUGAR TWICE DAILY  . Alcohol Swabs (B-D SINGLE USE SWABS REGULAR) PADS Use as directed top check blood sugars dx E11.8  . Blood Glucose Calibration (ACCU-CHEK SMARTVIEW CONTROL) LIQD Use BID dx e11.8  . Blood Glucose Monitoring Suppl (ACCU-CHEK AVIVA PLUS) w/Device KIT USE AS DIRECTED  . Lancets (ACCU-CHEK SOFT TOUCH)  lancets Use to check blood sugar twice daily. DX: E11.9  . Magnesium Oxide -Mg Supplement ([employer] MAGNESIUM OXIDE) 250 MG TABS Take 1 tablet (250 mg total) by mouth daily.  . metFORMIN (GLUCOPHAGE) 1000 MG tablet TAKE 1 TABLET TWICE DAILY WITH A MEAL  . pantoprazole (PROTONIX) 40 MG tablet TAKE 1 TABLET EVERY DAY  . pioglitazone (ACTOS) 15 MG tablet TAKE 1 TABLET EVERY DAY  . simvastatin (ZOCOR) 20 MG tablet TAKE 1 TABLET (20 MG TOTAL) BY MOUTH AT BEDTIME.   No facility-administered encounter medications on file as of 07/03/2020.    Allergies (verified) Patient has no known allergies.   History: Past Medical History:  Diagnosis Date  . Diverticulosis   . DM (diabetes mellitus) ([place]   . Endocarditis, valve unspecified, unspecified cause   . Gallstones   . GERD (gastroesophageal reflux disease)   . Hepatic steatosis   . History of rheumatic fever   . HLD (hyperlipidemia)   . Liver mass   . Syncope and collapse   . Tubular adenoma of colon    Past Surgical History:  Procedure Laterality Date  . COLONOSCOPY  07/05/2014  . ESOPHAGOGASTRODUODENOSCOPY    . FINGER SURGERY Right    cyst removed middle finger  . MOUTH SURGERY     Family History  Problem Relation Age of Onset  . Coronary artery disease Mother        AICD/PACER  . Hypertension Mother   . Diabetes Mother   . Hyperlipidemia Mother   . Colon polyps Mother   .  Coronary artery disease Father        AICD Pacer  . Cancer Father 58       ANAL  . Colon cancer Maternal Uncle   . Stomach cancer Other        materal great uncle and great aunt  . Esophageal cancer Sister        1/2 sister  . Prostate cancer Neg Hx   . Liver disease Neg Hx   . Rectal cancer Neg Hx    Social History   Socioeconomic History  . Marital status: Married    Spouse name: Not on file  . Number of children: 2  . Years of education: 31  . Highest education level: Not on file  Occupational History  . Occupation: Education administrator: RETIRED   Tobacco Use  . Smoking status: Former Smoker    Packs/day: 0.50    Years: 20.00    Pack years: 10.00    Types: Cigarettes, Pipe    Quit date: 02/17/1983    Years since quitting: 37.4  . Smokeless tobacco: Never Used  . Tobacco comment: Smoked until 83/ smoked pipe x 5 years  Vaping Use  . Vaping Use: Never used  Substance and Sexual Activity  . Alcohol use: Yes    Alcohol/week: 2.0 standard drinks    Types: 2 Cans of beer per week    Comment: occasional beer   . Drug use: No  . Sexual activity: Yes  Other Topics Concern  . Not on file  Social History Narrative   HSG, community college 2 years. [employer]-3 years, E5 at discharge. Married  '65. 1 son '70, 1 dtr '69; 2 grandchildren. 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 Difficulty of Paying Living Expenses: Not hard at all  Food Insecurity: No Food Insecurity  . Worried About Charity fundraiser in the Last Year: Never true  . Ran Out of Food in the Last Year: Never true  Transportation Needs: No Transportation Needs  . Lack of Transportation (Medical): No  . Lack of Transportation (Non-Medical): No  Physical Activity: Sufficiently Active  . Days of Exercise per Week: 5 days  . Minutes of Exercise per Session: 30 min  Stress: No Stress Concern Present  . Feeling of Stress : Not at all  Social Connections: Socially Integrated  . Frequency of Communication with Friends and Family: More than three times a week  . Frequency of Social Gatherings with Friends and Family: More than three times a week  . Attends Religious Services: More than 4 times per year  . Active Member of Clubs or Organizations: Yes  . Attends Archivist Meetings: More than 4 times per year  . Marital Status: Married    Tobacco Counseling Counseling given: Not Answered Comment: Smoked until 83/ smoked pipe x 5 years   Clinical Intake:  Pre-visit preparation completed:  Yes  Pain : No/denies pain Pain Score: 0-No pain     BMI - recorded: 26.39 Nutritional Status: BMI 25 -29 Overweight Nutritional Risks: None Diabetes: Yes CBG done?: Yes (140) CBG resulted in Enter/ Edit results?: Yes Did pt. bring in CBG monitor from home?: No  How often do you need to have someone help you when you read instructions, pamphlets, or other written materials from your doctor or pharmacy?: 1 - Never What is the last grade level you completed in school?: HSG;  2 years of community college; Retired Corporate treasurer  Diabetic? yes  Interpreter Needed?: No  Information entered by :: Lisette Abu, LPN   Activities of Daily Living In your present state of health, do you have any difficulty performing the following activities: 07/03/2020  Hearing? N  Vision? N  Difficulty concentrating or making decisions? N  Walking or climbing stairs? N  Dressing or bathing? N  Doing errands, shopping? N  Preparing Food and eating ? N  Using the Toilet? N  In the past six months, have you accidently leaked urine? N  Do you have problems with loss of bowel control? N  Managing your Medications? N  Managing your Finances? N  Housekeeping or managing your Housekeeping? N  Some recent data might be hidden    Patient Care Team: Janith Lima, MD as PCP - General (Internal Medicine) Rhoton, Shelda Altes., MD (Gastroenterology)  Indicate any recent Medical Services you may have received from other than [facility] providers in the past year (date may be approximate).     Assessment:   This is a routine wellness examination for Tosh.  Hearing/Vision screen No exam data present  Dietary issues and exercise activities discussed: Current Exercise Habits: Home exercise routine, Time (Minutes): 30, Frequency (Times/Week): 5, Weekly Exercise (Minutes/Week): 150, Intensity: Moderate, Exercise limited by: None identified  Goals Addressed   None    Depression Screen PHQ 2/9 Scores 07/03/2020  05/18/2019 04/18/2018 03/03/2017 11/30/2015 11/05/2015 10/31/2014  PHQ - 2 Score 0 0 0 0 0 0 0    Fall Risk Fall Risk  07/03/2020 05/18/2019 04/18/2018 03/03/2017 11/30/2015  Falls in the past year? 0 0 0 No No  Number falls in past yr: 0 0 - - -  Injury with Fall? 0 0 - - -  Risk for fall due to : No Fall Risks No Fall Risks - - -  Follow up - Falls evaluation completed;Education provided;Falls prevention discussed - - -    FALL RISK PREVENTION PERTAINING TO THE HOME:  Any stairs in or around the home? No  If so, are there any without handrails? No  Home free of loose throw rugs in walkways, pet beds, electrical cords, etc? Yes  Adequate lighting in your home to reduce risk of falls? Yes   ASSISTIVE DEVICES UTILIZED TO PREVENT FALLS:  Life alert? No  Use of a cane, walker or w/c? No  Grab bars in the bathroom? Yes  Shower chair or bench in shower? Yes  Elevated toilet seat or a handicapped toilet? Yes   TIMED UP AND GO:  Was the test performed? No .  Length of time to ambulate 10 feet: 0 sec.   Gait steady and fast without use of assistive device  Cognitive Function: Normal cognitive status assessed by direct observation by this Nurse Health Advisor. No abnormalities found.   MMSE - Mini Mental State Exam 02/22/2017 11/05/2015 10/25/2014  Not completed: - (No Data) (No Data)  Orientation to time 5 - -  Orientation to Place 5 - -  Registration 3 - -  Attention/ Calculation 5 - -  Recall 2 - -  Language- name 2 objects 2 - -  Language- repeat 1 - -  Language- follow 3 step command 3 - -  Language- read & follow direction 1 - -  Write a sentence 1 - -  Copy design 1 - -  Total score 29 - -     6CIT Screen 05/18/2019  What Year? 0 points  What month? 0 points  What time? 0 points  Count back from 20 0 points  Months in reverse 0 points  Repeat phrase 0 points  Total Score 0    Immunizations Immunization History  Administered Date(s) Administered  . PFIZER(Purple  Top)SARS-COV-2 Vaccination 02/28/2019, 03/31/2019  . Pneumococcal Conjugate-13 08/21/2013  . Pneumococcal Polysaccharide-23 10/25/2012  . Td 03/07/2010    TDAP status: Due, Education has been provided regarding the importance of this vaccine. Advised may receive this vaccine at local pharmacy or Health Dept. Aware to provide a copy of the vaccination record if obtained from local pharmacy or Health Dept. Verbalized acceptance and understanding.  Flu Vaccine status: Declined, Education has been provided regarding the importance of this vaccine but patient still declined. Advised may receive this vaccine at local pharmacy or Health Dept. Aware to provide a copy of the vaccination record if obtained from local pharmacy or Health Dept. Verbalized acceptance and understanding.  Pneumococcal vaccine status: Up to date  Covid-19 vaccine status: Completed vaccines  Qualifies for Shingles Vaccine? Yes   Zostavax completed No   Shingrix Completed?: No.    Education has been provided regarding the importance of this vaccine. Patient has been advised to call insurance company to determine out of pocket expense if they have not yet received this vaccine. Advised may also receive vaccine at local pharmacy or Health Dept. Verbalized acceptance and understanding.  Screening Tests Health Maintenance  Topic Date Due  . OPHTHALMOLOGY EXAM  12/02/2018  . COVID-19 Vaccine (3 - Booster for Pfizer series) 08/28/2019  . HEMOGLOBIN A1C  01/04/2020  . TETANUS/TDAP  03/07/2020  . FOOT EXAM  07/03/2020  . URINE MICROALBUMIN  07/03/2020  . INFLUENZA VACCINE  09/16/2020  . Hepatitis C Screening  Completed  . PNA vac Low Risk Adult  Completed  . HPV VACCINES  Aged Out    Health Maintenance  Health Maintenance Due  Topic Date Due  . OPHTHALMOLOGY EXAM  12/02/2018  . COVID-19 Vaccine (3 - Booster for Pfizer series) 08/28/2019  . HEMOGLOBIN A1C  01/04/2020  . TETANUS/TDAP  03/07/2020  . FOOT EXAM  07/03/2020   . URINE MICROALBUMIN  07/03/2020    Colorectal cancer screening: Type of screening: Colonoscopy. Completed 08/08/2019. Repeat every 0 years  Lung Cancer Screening: (Low Dose CT Chest recommended if Age 84-80 years, 30 pack-year currently smoking OR have quit w/in 15years.) does not qualify.   Lung Cancer Screening Referral: no  Additional Screening:  Hepatitis C Screening: does qualify; Completed yes  Vision Screening: Recommended annual ophthalmology exams for early detection of glaucoma and other disorders of the eye. Is the patient up to date with their annual eye exam?  Yes  Who is the provider or what is the name of the office in which the patient attends annual eye exams? [facility] If pt is not established with a provider, would they like to be referred to a provider to establish care? No .   Dental Screening: Recommended annual dental exams for proper oral hygiene  Community Resource Referral / Chronic Care Management: CRR required this visit?  No   CCM required this visit?  No      Plan:     I have personally reviewed and noted the following in the patient's chart:   . Medical and social history . Use of alcohol, tobacco or illicit drugs  . Current medications and supplements including opioid prescriptions. Patient is not currently taking opioid prescriptions. . Functional ability  and status . Nutritional status . Physical activity . Advanced directives . List of other physicians . Hospitalizations, surgeries, and ER visits in previous 12 months . Vitals . Screenings to include cognitive, depression, and falls . Referrals and appointments  In addition, I have reviewed and discussed with patient certain preventive protocols, quality metrics, and best practice recommendations. A written personalized care plan for preventive services as well as general preventive health recommendations were provided to patient.     Sheral Flow,  LPN   [id number]   Nurse Notes: n/a

## 2020-07-08 ENCOUNTER — Encounter: Payer: Self-pay | Admitting: Internal Medicine

## 2020-07-08 ENCOUNTER — Ambulatory Visit (INDEPENDENT_AMBULATORY_CARE_PROVIDER_SITE_OTHER): Payer: Medicare HMO | Admitting: Internal Medicine

## 2020-07-08 ENCOUNTER — Other Ambulatory Visit: Payer: Self-pay

## 2020-07-08 VITALS — BP 124/76 | HR 87 | Temp 98.1°F | Resp 16 | Ht 71.0 in | Wt 187.0 lb

## 2020-07-08 DIAGNOSIS — Z0000 Encounter for general adult medical examination without abnormal findings: Secondary | ICD-10-CM | POA: Diagnosis not present

## 2020-07-08 DIAGNOSIS — K7581 Nonalcoholic steatohepatitis (NASH): Secondary | ICD-10-CM

## 2020-07-08 DIAGNOSIS — L989 Disorder of the skin and subcutaneous tissue, unspecified: Secondary | ICD-10-CM | POA: Diagnosis not present

## 2020-07-08 DIAGNOSIS — R160 Hepatomegaly, not elsewhere classified: Secondary | ICD-10-CM | POA: Diagnosis not present

## 2020-07-08 DIAGNOSIS — E785 Hyperlipidemia, unspecified: Secondary | ICD-10-CM | POA: Diagnosis not present

## 2020-07-08 DIAGNOSIS — E118 Type 2 diabetes mellitus with unspecified complications: Secondary | ICD-10-CM

## 2020-07-08 DIAGNOSIS — Z23 Encounter for immunization: Secondary | ICD-10-CM

## 2020-07-08 LAB — CBC WITH DIFFERENTIAL/PLATELET
Basophils Absolute: 0.1 10*3/uL (ref 0.0–0.1)
Basophils Relative: 1 % (ref 0.0–3.0)
Eosinophils Absolute: 0.2 10*3/uL (ref 0.0–0.7)
Eosinophils Relative: 2.9 % (ref 0.0–5.0)
HCT: 41.6 % (ref 39.0–52.0)
Hemoglobin: 13.9 g/dL (ref 13.0–17.0)
Lymphocytes Relative: 33.2 % (ref 12.0–46.0)
Lymphs Abs: 2.4 10*3/uL (ref 0.7–4.0)
MCHC: 33.3 g/dL (ref 30.0–36.0)
MCV: 90.5 fl (ref 78.0–100.0)
Monocytes Absolute: 0.7 10*3/uL (ref 0.1–1.0)
Monocytes Relative: 9.2 % (ref 3.0–12.0)
Neutro Abs: 3.8 10*3/uL (ref 1.4–7.7)
Neutrophils Relative %: 53.7 % (ref 43.0–77.0)
Platelets: 154 10*3/uL (ref 150.0–400.0)
RBC: 4.6 Mil/uL (ref 4.22–5.81)
RDW: 14.1 % (ref 11.5–15.5)
WBC: 7.1 10*3/uL (ref 4.0–10.5)

## 2020-07-08 LAB — PROTIME-INR
INR: 1.1 ratio — ABNORMAL HIGH (ref 0.8–1.0)
Prothrombin Time: 11.8 s (ref 9.6–13.1)

## 2020-07-08 LAB — TSH: TSH: 2.38 u[IU]/mL (ref 0.35–4.50)

## 2020-07-08 LAB — HEMOGLOBIN A1C: Hgb A1c MFr Bld: 7.6 % — ABNORMAL HIGH (ref 4.6–6.5)

## 2020-07-08 NOTE — Progress Notes (Signed)
Subjective:  Patient ID: Angel Rodgers, male    DOB: Mar 05, 1945  Age: 75 y.o. MRN: [id number]  CC: Annual Exam, Hyperlipidemia, and Diabetes  This visit occurred during the SARS-CoV-2 public health emergency.  Safety protocols were in place, including screening questions prior to the visit, additional usage of staff PPE, and extensive cleaning of exam room while observing appropriate contact time as indicated for disinfecting solutions.    HPI Angel Rodgers presents for a CPX and f/up -  He is very active and denies any recent episodes of chest pain, shortness of breath, palpitations, edema, or fatigue.  He complains about 2 lesions on his face.  One is in front of the left ear - This has been slowly growing for 4 years.  The other one is more recent and behind his right ear.  It intermittently bleeds.  Outpatient Medications Prior to Visit  Medication Sig Dispense Refill  . ACCU-CHEK AVIVA PLUS test strip CHECK BLOOD SUGAR TWICE DAILY 200 strip 2  . Alcohol Swabs (B-D SINGLE USE SWABS REGULAR) PADS Use as directed top check blood sugars dx E11.8 100 each 3  . Blood Glucose Calibration (ACCU-CHEK SMARTVIEW CONTROL) LIQD Use BID dx e11.8 3 each 3  . Blood Glucose Monitoring Suppl (ACCU-CHEK AVIVA PLUS) w/Device KIT USE AS DIRECTED 1 kit 2  . Lancets (ACCU-CHEK SOFT TOUCH) lancets Use to check blood sugar twice daily. DX: E11.9 200 each 3  . pantoprazole (PROTONIX) 40 MG tablet TAKE 1 TABLET EVERY DAY 90 tablet 2  . Magnesium Oxide -Mg Supplement ([employer]S MAGNESIUM OXIDE) 250 MG TABS Take 1 tablet (250 mg total) by mouth daily. 90 tablet 1  . metFORMIN (GLUCOPHAGE) 1000 MG tablet TAKE 1 TABLET TWICE DAILY WITH A MEAL 180 tablet 1  . pioglitazone (ACTOS) 15 MG tablet TAKE 1 TABLET EVERY DAY 90 tablet 1  . simvastatin (ZOCOR) 20 MG tablet TAKE 1 TABLET (20 MG TOTAL) BY MOUTH AT BEDTIME. 90 tablet 0   No facility-administered medications prior to visit.    ROS Review of Systems   Constitutional: Negative for appetite change, diaphoresis and fatigue.  HENT: Negative.   Eyes: Negative.   Respiratory: Negative.  Negative for cough, chest tightness, shortness of breath and wheezing.   Cardiovascular: Negative for chest pain, palpitations and leg swelling.  Gastrointestinal: Negative for abdominal pain, constipation, diarrhea and vomiting.  Endocrine: Negative.  Negative for polydipsia, polyphagia and polyuria.  Genitourinary: Negative.   Musculoskeletal: Negative.  Negative for arthralgias, joint swelling and myalgias.  Skin: Negative.   Neurological: Negative.  Negative for dizziness, weakness, light-headedness and headaches.  Hematological: Negative for adenopathy. Does not bruise/bleed easily.  Psychiatric/Behavioral: Negative.     Objective:  BP 124/76 (BP Location: Left Arm, Patient Position: Sitting, Cuff Size: Large)   Pulse 87   Temp 98.1 F (36.7 C) (Oral)   Resp 16   Ht _0  (1.803 m)   Wt 187 lb (84.8 kg)   SpO2 96%   BMI 26.08 kg/m   BP Readings from Last 3 Encounters:  07/08/20 124/76  07/03/20 122/60  08/08/19 135/71    Wt Readings from Last 3 Encounters:  07/08/20 187 lb (84.8 kg)  07/03/20 189 lb 3.2 oz (85.8 kg)  08/08/19 185 lb (83.9 kg)    Physical Exam Vitals reviewed.  Constitutional:      Appearance: Normal appearance.  HENT:     Nose: Nose normal.     Mouth/Throat:  Mouth: Mucous membranes are moist.  Eyes:     General: No scleral icterus.    Conjunctiva/sclera: Conjunctivae normal.  Cardiovascular:     Rate and Rhythm: Normal rate and regular rhythm.     Heart sounds: No murmur heard.   Pulmonary:     Effort: Pulmonary effort is normal.     Breath sounds: No stridor. No wheezing, rhonchi or rales.  Abdominal:     General: Abdomen is flat. Bowel sounds are normal. There is no distension.     Palpations: Abdomen is soft. There is no hepatomegaly, splenomegaly or mass.     Tenderness: There is no abdominal  tenderness. There is no guarding or rebound.     Hernia: No hernia is present.  Musculoskeletal:        General: Normal range of motion.     Cervical back: Neck supple.     Right lower leg: No edema.     Left lower leg: No edema.  Lymphadenopathy:     Cervical: No cervical adenopathy.  Skin:    General: Skin is warm and dry.     Coloration: Skin is not pale.  Neurological:     General: No focal deficit present.     Mental Status: He is alert.  Psychiatric:        Mood and Affect: Mood normal.        Behavior: Behavior normal.     Lab Results  Component Value Date   WBC 7.1 07/08/2020   HGB 13.9 07/08/2020   HCT 41.6 07/08/2020   PLT 154.0 07/08/2020   GLUCOSE 90 07/08/2020   CHOL 124 07/08/2020   TRIG 62.0 07/08/2020   HDL 49.10 07/08/2020   LDLDIRECT 169.8 02/21/2010   LDLCALC 62 07/08/2020   ALT 38 07/08/2020   AST 29 07/08/2020   NA 138 07/08/2020   K 4.6 07/08/2020   CL 103 07/08/2020   CREATININE 0.86 07/08/2020   BUN 19 07/08/2020   CO2 25 07/08/2020   TSH 2.38 07/08/2020   PSA 0.46 06/22/2018   INR 1.1 (H) 07/08/2020   HGBA1C 7.6 (H) 07/08/2020   MICROALBUR <0.7 07/04/2019    VAS [place] AAA DUPLEX  Result Date: 04/20/2019 ABDOMINAL AORTA STUDY Indications: Follow up exam for known AAA. Risk Factors: Hyperlipidemia, Diabetes, past history of smoking. Limitations: Air/bowel gas.  Comparison Study: 03/26/2017 - [facility]                   Abdomen Complete Ultrasound: 3 cm Aorta noted. Performing Technologist: Caralee Ates BA, RVT, RDMS  Examination Guidelines: A complete evaluation includes B-mode imaging, spectral Doppler, color Doppler, and power Doppler as needed of all accessible portions of each vessel. Bilateral testing is considered an integral part of a complete examination. Limited examinations for reoccurring indications may be performed as noted.  Abdominal Aorta Findings:  +-----------+-------+----------+----------+--------+--------+--------+ Location   AP (cm)Trans (cm)PSV (cm/s)WaveformThrombusComments +-----------+-------+----------+----------+--------+--------+--------+ Supraceliac2.76   2.70      60        biphasic                 +-----------+-------+----------+----------+--------+--------+--------+ Proximal   2.73   2.76      50        biphasic                 +-----------+-------+----------+----------+--------+--------+--------+ Mid        2.38   2.63      79  biphasic                 +-----------+-------+----------+----------+--------+--------+--------+ Distal     3.04   3.31      50        biphasic                 +-----------+-------+----------+----------+--------+--------+--------+ RT CIA Prox1.4    1.5       77        biphasic                 +-----------+-------+----------+----------+--------+--------+--------+ RT CIA Mid 1.4    1.5       102       biphasic                 +-----------+-------+----------+----------+--------+--------+--------+ Visualization of the [place] artery and Proximal Abdominal Aorta was limited.  Summary: Abdominal Aorta: There is evidence of abnormal dilatation of the distal Abdominal aorta. The largest aortic measurement is 3.3 cm.  *See table(s) above for measurements and observations.  Electronically signed by Ruta Hinds MD on 04/20/2019 at 8:31:47 AM.   Final     Assessment & Plan:   Angel Rodgers was seen today for annual exam, hyperlipidemia and diabetes.  Diagnoses and all orders for this visit:  Type 2 diabetes mellitus with manifestations ([place])- His blood sugar is adequately well controlled.  Will continue the current doses of metformin and pioglitazone. -     CBC with Differential/Platelet; Future -     Basic metabolic panel; Future -     Hemoglobin A1c; Future -     Hemoglobin A1c -     Basic metabolic panel -     CBC with Differential/Platelet -     HM  Diabetes Foot Exam -     metFORMIN (GLUCOPHAGE) 1000 MG tablet; Take 1 tablet (1,000 mg total) by mouth 2 (two) times daily with a meal. -     pioglitazone (ACTOS) 15 MG tablet; Take 1 tablet (15 mg total) by mouth daily.  Hyperlipidemia with target LDL less than 70- He has achieved his LDL goal and is doing well on the statin. -     CBC with Differential/Platelet; Future -     Lipid panel; Future -     TSH; Future -     TSH -     Lipid panel -     CBC with Differential/Platelet -     simvastatin (ZOCOR) 20 MG tablet; Take 1 tablet (20 mg total) by mouth at bedtime.  Liver mass, right lobe- His LFTs are normal now. -     CBC with Differential/Platelet; Future -     Hepatic function panel; Future -     Protime-INR; Future -     Protime-INR -     Hepatic function panel -     CBC with Differential/Platelet  Routine health maintenance- Exam completed, labs reviewed, vaccines reviewed and updated, no cancer screenings are indicated, patient education was given.  NASH (nonalcoholic steatohepatitis)- His LFTs are normal.  Will continue the current dose of pioglitazone. -     CBC with Differential/Platelet; Future -     Hepatic function panel; Future -     Protime-INR; Future -     Protime-INR -     Hepatic function panel -     CBC with Differential/Platelet -     pioglitazone (ACTOS) 15 MG tablet; Take 1 tablet (15 mg total) by mouth daily.  Skin lesion of face -  Ambulatory referral to Dermatology  Other orders -     Tdap vaccine greater than or equal to 7yo IM -     Pneumococcal polysaccharide vaccine 23-valent greater than or equal to 2yo subcutaneous/IM   I have discontinued Sherren Mocha Magnesium Oxide -Mg Supplement. I have also changed his metFORMIN and pioglitazone. Additionally, I am having him maintain his B-D SINGLE USE SWABS REGULAR, Accu-Chek SmartView Control, accu-chek soft touch, Accu-Chek Aviva Plus, Accu-Chek Aviva Plus, pantoprazole, and simvastatin.  Meds  ordered this encounter  Medications  . metFORMIN (GLUCOPHAGE) 1000 MG tablet    Sig: Take 1 tablet (1,000 mg total) by mouth 2 (two) times daily with a meal.    Dispense:  180 tablet    Refill:  1  . simvastatin (ZOCOR) 20 MG tablet    Sig: Take 1 tablet (20 mg total) by mouth at bedtime.    Dispense:  90 tablet    Refill:  1  . pioglitazone (ACTOS) 15 MG tablet    Sig: Take 1 tablet (15 mg total) by mouth daily.    Dispense:  90 tablet    Refill:  1     Follow-up: Return in about 6 months (around 01/08/2021).  Scarlette Calico, MD

## 2020-07-08 NOTE — Patient Instructions (Addendum)

## 2020-07-08 NOTE — Progress Notes (Signed)
Pt has been informed that the recommended vaccine TDAP, may not be covered under their current Medicare insurance. Discussed that they will possibly incur a bill for the TDAP vaccine & administration fee.  Pt understands that if they want the TDAP vaccine, Medicare will be billed for an official decision on payment, which will be sent to them in a Medicare Summary Notice (MSN). They understand that if Medicare does not pay, they are responsible for payment.

## 2020-07-09 ENCOUNTER — Encounter: Payer: Self-pay | Admitting: Internal Medicine

## 2020-07-09 LAB — LIPID PANEL
Cholesterol: 124 mg/dL (ref 0–200)
HDL: 49.1 mg/dL (ref >39.00)
LDL Cholesterol: 62 mg/dL (ref 0–99)
NonHDL: 74.63
Total CHOL/HDL Ratio: 3
Triglycerides: 62 mg/dL (ref 0.0–149.0)
VLDL: 12.4 mg/dL (ref 0.0–40.0)

## 2020-07-09 LAB — BASIC METABOLIC PANEL
BUN: 19 mg/dL (ref 6–23)
CO2: 25 mEq/L (ref 19–32)
Calcium: 9.6 mg/dL (ref 8.4–10.5)
Chloride: 103 mEq/L (ref 96–112)
Creatinine, Ser: 0.86 mg/dL (ref 0.40–1.50)
GFR: 83.71 mL/min (ref >60.00)
Glucose, Bld: 90 mg/dL (ref 70–99)
Potassium: 4.6 mEq/L (ref 3.5–5.1)
Sodium: 138 mEq/L (ref 135–145)

## 2020-07-09 LAB — HEPATIC FUNCTION PANEL
ALT: 38 U/L (ref 0–53)
AST: 29 U/L (ref 0–37)
Albumin: 4.5 g/dL (ref 3.5–5.2)
Alkaline Phosphatase: 88 U/L (ref 39–117)
Bilirubin, Direct: 0.1 mg/dL (ref 0.0–0.3)
Total Bilirubin: 0.5 mg/dL (ref 0.2–1.2)
Total Protein: 7.3 g/dL (ref 6.0–8.3)

## 2020-07-09 MED ORDER — METFORMIN HCL 1000 MG PO TABS: 1000.0000 mg | ORAL_TABLET | Freq: Two times a day (BID) | ORAL | 1 refills | Status: DC

## 2020-07-09 MED ORDER — PIOGLITAZONE HCL 15 MG PO TABS: 15.0000 mg | ORAL_TABLET | Freq: Every day | ORAL | 1 refills | Status: DC

## 2020-07-09 MED ORDER — SIMVASTATIN 20 MG PO TABS: 20.0000 mg | ORAL_TABLET | Freq: Every day | ORAL | 1 refills | Status: DC

## 2020-08-28 ENCOUNTER — Other Ambulatory Visit: Payer: Self-pay | Admitting: Internal Medicine

## 2020-12-26 ENCOUNTER — Other Ambulatory Visit: Payer: Self-pay | Admitting: Internal Medicine

## 2020-12-26 DIAGNOSIS — E118 Type 2 diabetes mellitus with unspecified complications: Secondary | ICD-10-CM

## 2020-12-26 DIAGNOSIS — K7581 Nonalcoholic steatohepatitis (NASH): Secondary | ICD-10-CM

## 2020-12-26 DIAGNOSIS — E785 Hyperlipidemia, unspecified: Secondary | ICD-10-CM

## 2020-12-30 ENCOUNTER — Other Ambulatory Visit: Payer: Self-pay | Admitting: Internal Medicine

## 2020-12-30 DIAGNOSIS — E118 Type 2 diabetes mellitus with unspecified complications: Secondary | ICD-10-CM

## 2021-01-01 ENCOUNTER — Other Ambulatory Visit: Payer: Self-pay

## 2021-01-01 ENCOUNTER — Encounter: Payer: Self-pay | Admitting: Physician Assistant

## 2021-01-01 ENCOUNTER — Ambulatory Visit: Payer: Medicare HMO | Admitting: Physician Assistant

## 2021-01-01 DIAGNOSIS — L821 Other seborrheic keratosis: Secondary | ICD-10-CM | POA: Diagnosis not present

## 2021-01-01 NOTE — Progress Notes (Signed)
   New Patient   Subjective  Angel Rodgers is a 75 y.o. male who presents for the following: New Patient (Initial Visit) (Patient here today for lesion on his left sideburn x 2-3 years that does flake off, no bleeding, no pain. Per patient he also has a lesion behind his right ear x 1 year that was bleeding and per his wife it's dark. No personal history or family history of atypical moles, melanoma or non mole skin cancer. ).   The following portions of the chart were reviewed this encounter and updated as appropriate:  Tobacco  Allergies  Meds  Problems  Med Hx  Surg Hx  Fam Hx      Objective  Well appearing patient in no apparent distress; mood and affect are within normal limits.  All skin waist up examined.  Left Zygomatic Area, Mid Parietal Scalp, Right Forehead, Right Postauricular Sulcus Stuck-on, dark, crusty plaques.    Assessment & Plan  Seborrheic keratosis (4) Right Postauricular Sulcus; Mid Parietal Scalp; Left Zygomatic Area; Right Forehead  observe     I, Sher Shampine, PA-C, have reviewed all documentation's for this visit.  The documentation on 01/01/21 for the exam, diagnosis, procedures and orders are all accurate and complete.

## 2021-01-02 NOTE — Progress Notes (Signed)
Subjective:    Patient ID: Angel Rodgers, male    DOB: 1945/08/12, 75 y.o.   MRN: [id number]  This visit occurred during the SARS-CoV-2 public health emergency.  Safety protocols were in place, including screening questions prior to the visit, additional usage of staff PPE, and extensive cleaning of exam room while observing appropriate contact time as indicated for disinfecting solutions.     HPI The patient is here for follow up of their chronic medical problems, including DM, hld  He is taking all of his medications as prescribed.   Sugars at home 126-150.  He is active with his grandkids.      Medications and allergies reviewed with patient and updated if appropriate.  Patient Active Problem List   Diagnosis Date Noted   Skin lesion of face 07/08/2020   Nocturnal leg cramps 07/04/2019   Abdominal aortic aneurysm (AAA) 3.0 cm to 5.5 cm in diameter in male 04/03/2019   Benign prostatic hyperplasia without lower urinary tract symptoms 06/22/2018   Liver mass, right lobe 03/26/2017   NASH (nonalcoholic steatohepatitis) 03/03/2017   Dysphagia 11/28/2015   Routine health maintenance 08/03/2011   Type 2 diabetes mellitus with manifestations ([place]) 02/21/2010   Hyperlipidemia with target LDL less than 70 02/21/2010   GERD 02/21/2010    Current Outpatient Medications on File Prior to Visit  Medication Sig Dispense Refill   ACCU-CHEK AVIVA PLUS test strip CHECK BLOOD SUGAR TWICE DAILY 200 strip 2   Alcohol Swabs (B-D SINGLE USE SWABS REGULAR) PADS Use as directed top check blood sugars dx E11.8 100 each 3   Blood Glucose Calibration (ACCU-CHEK SMARTVIEW CONTROL) LIQD Use BID dx e11.8 3 each 3   Blood Glucose Monitoring Suppl (ACCU-CHEK AVIVA PLUS) w/Device KIT USE AS DIRECTED 1 kit 2   Lancets (ACCU-CHEK SOFT TOUCH) lancets Use to check blood sugar twice daily. DX: E11.9 200 each 3   pantoprazole (PROTONIX) 40 MG tablet TAKE 1 TABLET EVERY DAY 90 tablet 1   No current  facility-administered medications on file prior to visit.    Past Medical History:  Diagnosis Date   Diverticulosis    DM (diabetes mellitus) ([place])    Endocarditis, valve unspecified, unspecified cause    Gallstones    GERD (gastroesophageal reflux disease)    Hepatic steatosis    History of rheumatic fever    HLD (hyperlipidemia)    Liver mass    Syncope and collapse    Tubular adenoma of colon     Past Surgical History:  Procedure Laterality Date   COLONOSCOPY  07/05/2014   ESOPHAGOGASTRODUODENOSCOPY     FINGER SURGERY Right    cyst removed middle finger   MOUTH SURGERY      Social History   Socioeconomic History   Marital status: Married    Spouse name: Not on file   Number of children: 2   Years of education: 12   Highest education level: Not on file  Occupational History   Occupation: Education administrator: RETIRED  Tobacco Use   Smoking status: Former    Packs/day: 0.50    Years: 20.00    Pack years: 10.00    Types: Cigarettes, Pipe    Quit date: 02/17/1983    Years since quitting: 37.9   Smokeless tobacco: Never   Tobacco comments:    Smoked until 83/ smoked pipe x 5 years  Vaping Use   Vaping Use: Never used  Substance and Sexual Activity  Alcohol use: Yes    Alcohol/week: 2.0 standard drinks    Types: 2 Cans of beer per week    Comment: occasional beer    Drug use: No   Sexual activity: Yes  Other Topics Concern   Not on file  Social History Narrative   HSG, community college 2 years. [employer]-3 years, E5 at discharge. Married  '65. 1 son '70, 1 dtr '69; 2 grandchildren. Work- drove Theatre manager now retired   Investment banker, operational of Radio broadcast assistant Strain: Low Risk    Difficulty of Paying Living Expenses: Not hard at [employer] Insecurity: No Food Insecurity   Worried About Charity fundraiser in the Last Year: Never true   Arboriculturist in the Last Year: Never true  Transportation Needs: No Arboriculturist (Medical): No   Lack of Transportation (Non-Medical): No  Physical Activity: Sufficiently Active   Days of Exercise per Week: 5 days   Minutes of Exercise per Session: 30 min  Stress: No Stress Concern Present   Feeling of Stress : Not at all  Social Connections: Socially Integrated   Frequency of Communication with Friends and Family: More than three times a week   Frequency of Social Gatherings with Friends and Family: More than three times a week   Attends Religious Services: More than 4 times per year   Active Member of [employer] or Organizations: Yes   Attends Music therapist: More than 4 times per year   Marital Status: Married    Family History  Problem Relation Age of Onset   Coronary artery disease Mother        AICD/PACER   Hypertension Mother    Diabetes Mother    Hyperlipidemia Mother    Colon polyps Mother    Coronary artery disease Father        AICD Pacer   Cancer Father 45       ANAL   Colon cancer Maternal Uncle    Stomach cancer Other        materal great uncle and great aunt   Esophageal cancer Sister        1/2 sister   Prostate cancer Neg Hx    Liver disease Neg Hx    Rectal cancer Neg Hx     Review of Systems  Constitutional:  Negative for chills and fever.  Respiratory:  Negative for cough, shortness of breath and wheezing.   Cardiovascular:  Negative for chest pain, palpitations and leg swelling.  Neurological:  Positive for numbness (little in feet at times). Negative for light-headedness and headaches.      Objective:   Vitals:   01/03/21 0926  BP: 112/78  Pulse: 65  Temp: 98 F (36.7 C)  SpO2: 99%   BP Readings from Last 3 Encounters:  01/03/21 112/78  07/08/20 124/76  07/03/20 122/60   Wt Readings from Last 3 Encounters:  01/03/21 192 lb (87.1 kg)  07/08/20 187 lb (84.8 kg)  07/03/20 189 lb 3.2 oz (85.8 kg)   Body mass index is 26.78 kg/m.   Physical Exam    Constitutional: Appears  well-developed and well-nourished. No distress.  HENT:  Head: Normocephalic and atraumatic.  Neck: Neck supple. No tracheal deviation present. No thyromegaly present.  No cervical lymphadenopathy Cardiovascular: Normal rate, regular rhythm and normal heart sounds.   No murmur heard. No carotid bruit .  No edema Pulmonary/Chest: Effort normal and breath sounds  normal. No respiratory distress. No has no wheezes. No rales.  Skin: Skin is warm and dry. Not diaphoretic.  Psychiatric: Normal mood and affect. Behavior is normal.      Assessment & Plan:    Diabetes, type II: Chronic  Lab Results  Component Value Date   HGBA1C 7.6 (H) 07/08/2020   A1c 6 months ago higher than ideal Sugars controlled at home Continue metformin 1000 mg twice daily, Actos 15 mg daily A1c, urine microalbumin today Will titrate medication as needed  Hyperlipidemia: Chronic Regular exercise and healthy diet encouraged Check lipid panel, CMP Continue simvastatin 20 mg daily   Medications refilled. Will set up CPE with PCP for 6 months

## 2021-01-02 NOTE — Patient Instructions (Addendum)
    Blood work was ordered.     Medications changes include :  none   Your prescription(s) have been submitted to your pharmacy. Please take as directed and contact our office if you believe you are having problem(s) with the medication(s).   Please followup in 6 months

## 2021-01-03 ENCOUNTER — Other Ambulatory Visit: Payer: Self-pay

## 2021-01-03 ENCOUNTER — Ambulatory Visit (INDEPENDENT_AMBULATORY_CARE_PROVIDER_SITE_OTHER): Payer: Medicare HMO | Admitting: Internal Medicine

## 2021-01-03 ENCOUNTER — Encounter: Payer: Self-pay | Admitting: Internal Medicine

## 2021-01-03 VITALS — BP 112/78 | HR 65 | Temp 98.0°F | Ht 71.0 in | Wt 192.0 lb

## 2021-01-03 DIAGNOSIS — E118 Type 2 diabetes mellitus with unspecified complications: Secondary | ICD-10-CM | POA: Diagnosis not present

## 2021-01-03 DIAGNOSIS — E785 Hyperlipidemia, unspecified: Secondary | ICD-10-CM

## 2021-01-03 DIAGNOSIS — K7581 Nonalcoholic steatohepatitis (NASH): Secondary | ICD-10-CM

## 2021-01-03 LAB — LIPID PANEL
Cholesterol: 125 mg/dL (ref 0–200)
HDL: 56.2 mg/dL (ref >39.00)
LDL Cholesterol: 58 mg/dL (ref 0–99)
NonHDL: 68.79
Total CHOL/HDL Ratio: 2
Triglycerides: 55 mg/dL (ref 0.0–149.0)
VLDL: 11 mg/dL (ref 0.0–40.0)

## 2021-01-03 LAB — COMPREHENSIVE METABOLIC PANEL
ALT: 43 U/L (ref 0–53)
AST: 36 U/L (ref 0–37)
Albumin: 4.5 g/dL (ref 3.5–5.2)
Alkaline Phosphatase: 79 U/L (ref 39–117)
BUN: 15 mg/dL (ref 6–23)
CO2: 28 mEq/L (ref 19–32)
Calcium: 9.4 mg/dL (ref 8.4–10.5)
Chloride: 101 mEq/L (ref 96–112)
Creatinine, Ser: 0.9 mg/dL (ref 0.40–1.50)
GFR: 82.29 mL/min (ref >60.00)
Glucose, Bld: 139 mg/dL — ABNORMAL HIGH (ref 70–99)
Potassium: 4.5 mEq/L (ref 3.5–5.1)
Sodium: 136 mEq/L (ref 135–145)
Total Bilirubin: 0.5 mg/dL (ref 0.2–1.2)
Total Protein: 7.3 g/dL (ref 6.0–8.3)

## 2021-01-03 LAB — HEMOGLOBIN A1C: Hgb A1c MFr Bld: 8 % — ABNORMAL HIGH (ref 4.6–6.5)

## 2021-01-03 LAB — MICROALBUMIN / CREATININE URINE RATIO
Creatinine,U: 61 mg/dL
Microalb Creat Ratio: 1.1 mg/g (ref 0.0–30.0)
Microalb, Ur: <0.7 mg/dL (ref 0.0–1.9)

## 2021-01-03 MED ORDER — PIOGLITAZONE HCL 15 MG PO TABS: 15.0000 mg | ORAL_TABLET | Freq: Every day | ORAL | 1 refills | Status: DC

## 2021-01-03 MED ORDER — SIMVASTATIN 20 MG PO TABS: 20.0000 mg | ORAL_TABLET | Freq: Every day | ORAL | 1 refills | Status: DC

## 2021-01-03 MED ORDER — METFORMIN HCL 1000 MG PO TABS: 1000.0000 mg | ORAL_TABLET | Freq: Two times a day (BID) | ORAL | 1 refills | Status: DC

## 2021-01-05 MED ORDER — PIOGLITAZONE HCL 30 MG PO TABS: 30.0000 mg | ORAL_TABLET | Freq: Every day | ORAL | 1 refills | Status: DC

## 2021-01-05 NOTE — Addendum Note (Signed)
Addended by: Binnie Rail on: 01/05/2021 04:31 PM   Modules accepted: Orders

## 2021-04-15 NOTE — Telephone Encounter (Signed)
Meds  were ordered

## 2021-04-17 ENCOUNTER — Other Ambulatory Visit: Payer: Self-pay | Admitting: Internal Medicine

## 2021-06-04 ENCOUNTER — Other Ambulatory Visit: Payer: Self-pay | Admitting: Internal Medicine

## 2021-06-04 DIAGNOSIS — E118 Type 2 diabetes mellitus with unspecified complications: Secondary | ICD-10-CM

## 2021-06-04 DIAGNOSIS — E785 Hyperlipidemia, unspecified: Secondary | ICD-10-CM

## 2021-06-04 MED ORDER — METFORMIN HCL 1000 MG PO TABS: 1000.0000 mg | ORAL_TABLET | Freq: Two times a day (BID) | ORAL | 0 refills | Status: DC

## 2021-06-04 MED ORDER — SIMVASTATIN 20 MG PO TABS: 20.0000 mg | ORAL_TABLET | Freq: Every day | ORAL | 0 refills | Status: DC

## 2021-06-04 MED ORDER — PIOGLITAZONE HCL 30 MG PO TABS: 30.0000 mg | ORAL_TABLET | Freq: Every day | ORAL | 0 refills | Status: DC

## 2021-07-08 ENCOUNTER — Ambulatory Visit (INDEPENDENT_AMBULATORY_CARE_PROVIDER_SITE_OTHER): Payer: Medicare HMO

## 2021-07-08 VITALS — BP 110/78 | HR 59 | Temp 97.6°F | Resp 16 | Ht 71.0 in | Wt 190.0 lb

## 2021-07-08 DIAGNOSIS — Z0000 Encounter for general adult medical examination without abnormal findings: Secondary | ICD-10-CM

## 2021-07-08 NOTE — Patient Instructions (Signed)
Angel Rodgers , Thank you for taking time to come for your Medicare Wellness Visit. I appreciate your ongoing commitment to your health goals. Please review the following plan we discussed and let me know if I can assist you in the future.   Screening recommendations/referrals: Colonoscopy: Discontinued due to age Recommended yearly ophthalmology/optometry visit for glaucoma screening and checkup Recommended yearly dental visit for hygiene and checkup  Vaccinations: Influenza vaccine: declined Pneumococcal vaccine: 08/21/2013, 07/08/2020 Tdap vaccine: 07/08/2020; due every 10 years Shingles vaccine: declined   Covid-19: 02/28/2019, 03/31/2019  Advanced directives: Yes; in the process of updating  Conditions/risks identified: Yes; Type II Diabetes Mellitus  Next appointment: Please schedule your next Medicare Wellness Visit with your Nurse Health Advisor in 1 year by calling [phone].  Preventive Care 2 Years and Older, Male Preventive care refers to lifestyle choices and visits with your health care provider that can promote health and wellness. What does preventive care include? A yearly physical exam. This is also called an annual well check. Dental exams once or twice a year. Routine eye exams. Ask your health care provider how often you should have your eyes checked. Personal lifestyle choices, including: Daily care of your teeth and gums. Regular physical activity. Eating a healthy diet. Avoiding tobacco and drug use. Limiting alcohol use. Practicing safe sex. Taking low doses of aspirin every day. Taking vitamin and mineral supplements as recommended by your health care provider. What happens during an annual well check? The services and screenings done by your health care provider during your annual well check will depend on your age, overall health, lifestyle risk factors, and family history of disease. Counseling  Your health care provider may ask you questions about  your: Alcohol use. Tobacco use. Drug use. Emotional well-being. Home and relationship well-being. Sexual activity. Eating habits. History of falls. Memory and ability to understand (cognition). Work and work Statistician. Screening  You may have the following tests or measurements: Height, weight, and BMI. Blood pressure. Lipid and cholesterol levels. These may be checked every 5 years, or more frequently if you are over 67 years old. Skin check. Lung cancer screening. You may have this screening every year starting at age 77 if you have a 30-pack-year history of smoking and currently smoke or have quit within the past 15 years. Fecal occult blood test (FOBT) of the stool. You may have this test every year starting at age 7. Flexible sigmoidoscopy or colonoscopy. You may have a sigmoidoscopy every 5 years or a colonoscopy every 10 years starting at age 96. Prostate cancer screening. Recommendations will vary depending on your family history and other risks. Hepatitis C blood test. Hepatitis B blood test. Sexually transmitted disease (STD) testing. Diabetes screening. This is done by checking your blood sugar (glucose) after you have not eaten for a while (fasting). You may have this done every 1-3 years. Abdominal aortic aneurysm (AAA) screening. You may need this if you are a current or former smoker. Osteoporosis. You may be screened starting at age 47 if you are at high risk. Talk with your health care provider about your test results, treatment options, and if necessary, the need for more tests. Vaccines  Your health care provider may recommend certain vaccines, such as: Influenza vaccine. This is recommended every year. Tetanus, diphtheria, and acellular pertussis (Tdap, Td) vaccine. You may need a Td booster every 10 years. Zoster vaccine. You may need this after age 68. Pneumococcal 13-valent conjugate (PCV13) vaccine. One dose is recommended after  age 20. Pneumococcal  polysaccharide (PPSV23) vaccine. One dose is recommended after age 7. Talk to your health care provider about which screenings and vaccines you need and how often you need them. This information is not intended to replace advice given to you by your health care provider. Make sure you discuss any questions you have with your health care provider. Document Released: 03/01/2015 Document Revised: 10/23/2015 Document Reviewed: 12/04/2014 Elsevier Interactive Patient Education  2017 [employer] Prevention in the Home Falls can cause injuries. They can happen to people of all ages. There are many things you can do to make your home safe and to help prevent falls. What can I do on the outside of my home? Regularly fix the edges of walkways and driveways and fix any cracks. Remove anything that might make you trip as you walk through a door, such as a raised step or threshold. Trim any bushes or trees on the path to your home. Use bright outdoor lighting. Clear any walking paths of anything that might make someone trip, such as rocks or tools. Regularly check to see if handrails are loose or broken. Make sure that both sides of any steps have handrails. Any raised decks and porches should have guardrails on the edges. Have any leaves, snow, or ice cleared regularly. Use sand or salt on walking paths during winter. Clean up any spills in your garage right away. This includes oil or grease spills. What can I do in the bathroom? Use night lights. Install grab bars by the toilet and in the tub and shower. Do not use towel bars as grab bars. Use non-skid mats or decals in the tub or shower. If you need to sit down in the shower, use a plastic, non-slip stool. Keep the floor dry. Clean up any water that spills on the floor as soon as it happens. Remove soap buildup in the tub or shower regularly. Attach bath mats securely with double-sided non-slip rug tape. Do not have throw rugs and other  things on the floor that can make you trip. What can I do in the bedroom? Use night lights. Make sure that you have a light by your bed that is easy to reach. Do not use any sheets or blankets that are too big for your bed. They should not hang down onto the floor. Have a firm chair that has side arms. You can use this for support while you get dressed. Do not have throw rugs and other things on the floor that can make you trip. What can I do in the kitchen? Clean up any spills right away. Avoid walking on wet floors. Keep items that you use a lot in easy-to-reach places. If you need to reach something above you, use a strong step stool that has a grab bar. Keep electrical cords out of the way. Do not use floor polish or wax that makes floors slippery. If you must use wax, use non-skid floor wax. Do not have throw rugs and other things on the floor that can make you trip. What can I do with my stairs? Do not leave any items on the stairs. Make sure that there are handrails on both sides of the stairs and use them. Fix handrails that are broken or loose. Make sure that handrails are as long as the stairways. Check any carpeting to make sure that it is firmly attached to the stairs. Fix any carpet that is loose or worn. Avoid having throw rugs  at the top or bottom of the stairs. If you do have throw rugs, attach them to the floor with carpet tape. Make sure that you have a light switch at the top of the stairs and the bottom of the stairs. If you do not have them, ask someone to add them for you. What else can I do to help prevent falls? Wear shoes that: Do not have high heels. Have rubber bottoms. Are comfortable and fit you well. Are closed at the toe. Do not wear sandals. If you use a stepladder: Make sure that it is fully opened. Do not climb a closed stepladder. Make sure that both sides of the stepladder are locked into place. Ask someone to hold it for you, if possible. Clearly  mark and make sure that you can see: Any grab bars or handrails. First and last steps. Where the edge of each step is. Use tools that help you move around (mobility aids) if they are needed. These include: Canes. Walkers. Scooters. Crutches. Turn on the lights when you go into a dark area. Replace any light bulbs as soon as they burn out. Set up your furniture so you have a clear path. Avoid moving your furniture around. If any of your floors are uneven, fix them. If there are any pets around you, be aware of where they are. Review your medicines with your doctor. Some medicines can make you feel dizzy. This can increase your chance of falling. Ask your doctor what other things that you can do to help prevent falls. This information is not intended to replace advice given to you by your health care provider. Make sure you discuss any questions you have with your health care provider. Document Released: 11/29/2008 Document Revised: 07/11/2015 Document Reviewed: 03/09/2014 Elsevier Interactive Patient Education  2017 [employer].

## 2021-07-08 NOTE — Progress Notes (Addendum)
Subjective:   Angel Rodgers is a 76 y.o. male who presents for Medicare Annual/Subsequent preventive examination.  Review of Systems     Cardiac Risk Factors include: advanced age (>75mn, >>95women);diabetes mellitus;dyslipidemia;family history of premature cardiovascular disease;male gender     Objective:    Today's Vitals   07/08/21 1201  BP: 110/78  Pulse: (!) 59  Resp: 16  Temp: 97.6 F (36.4 C)  SpO2: 98%  Weight: 190 lb (86.2 kg)  Height: 5' 11" (1.803 m)  PainSc: 0-No pain   Body mass index is 26.5 kg/m.     07/08/2021   12:34 PM 07/03/2020   11:44 AM 08/08/2019    9:02 AM 05/18/2019   10:18 AM 04/18/2018    9:27 AM 02/22/2017   11:05 AM 03/31/2016    1:17 PM  Advanced Directives  Does Patient Have a Medical Advance Directive? _0  Yes No  Type of AParamedicof AGilboaLiving will [place]Living will [place] BeachLiving will HW[place]Living will [place]illwynLiving will [place]if[place]Living will   Does patient want to make changes to medical advance directive? No - Patient declined No - Patient declined No - Patient declined No - Patient declined     Copy of HCenterburgin Chart? No - copy requested No - copy requested No - copy requested No - copy requested No - copy requested No - copy requested     Current Medications (verified) Outpatient Encounter Medications as of 07/08/2021  Medication Sig   ACCU-CHEK AVIVA PLUS test strip CHECK BLOOD SUGAR TWICE DAILY   Alcohol Swabs (B-D SINGLE USE SWABS REGULAR) PADS Use as directed top check blood sugars dx E11.8   Blood Glucose Calibration (ACCU-CHEK SMARTVIEW CONTROL) LIQD Use BID dx e11.8   Blood Glucose Monitoring Suppl (ACCU-CHEK AVIVA PLUS) w/Device KIT USE AS DIRECTED   Lancets (ACCU-CHEK SOFT TOUCH) lancets Use to check blood sugar twice daily. DX: E11.9   metFORMIN  (GLUCOPHAGE) 1000 MG tablet Take 1 tablet (1,000 mg total) by mouth 2 (two) times daily with a meal.   pantoprazole (PROTONIX) 40 MG tablet Take 1 tablet (40 mg total) by mouth daily. **CONTACT OFFICE TO SCHEDULE FOLLOW UP   pioglitazone (ACTOS) 30 MG tablet Take 1 tablet (30 mg total) by mouth daily.   simvastatin (ZOCOR) 20 MG tablet Take 1 tablet (20 mg total) by mouth at bedtime.   No facility-administered encounter medications on file as of 07/08/2021.    Allergies (verified) Patient has no known allergies.   History: Past Medical History:  Diagnosis Date   Diverticulosis    DM (diabetes mellitus) ([place]    Endocarditis, valve unspecified, unspecified cause    Gallstones    GERD (gastroesophageal reflux disease)    Hepatic steatosis    History of rheumatic fever    HLD (hyperlipidemia)    Liver mass    Syncope and collapse    Tubular adenoma of colon    Past Surgical History:  Procedure Laterality Date   COLONOSCOPY  07/05/2014   ESOPHAGOGASTRODUODENOSCOPY     FINGER SURGERY Right    cyst removed middle finger   MOUTH SURGERY     Family History  Problem Relation Age of Onset   Coronary artery disease Mother        AICD/PACER   Hypertension Mother    Diabetes Mother    Hyperlipidemia Mother    Colon  polyps Mother    Coronary artery disease Father        AICD Pacer   Cancer Father 29       ANAL   Colon cancer Maternal Uncle    Stomach cancer Other        materal great uncle and great aunt   Esophageal cancer Sister        1/2 sister   Prostate cancer Neg Hx    Liver disease Neg Hx    Rectal cancer Neg Hx    Social History   Socioeconomic History   Marital status: Married    Spouse name: Not on file   Number of children: 2   Years of education: 12   Highest education level: Not on file  Occupational History   Occupation: Education administrator: RETIRED  Tobacco Use   Smoking status: Former    Packs/day: 0.50    Years: 20.00    Pack years: 10.00     Types: Cigarettes, Pipe    Quit date: 02/17/1983    Years since quitting: 38.4   Smokeless tobacco: Never   Tobacco comments:    Smoked until 83/ smoked pipe x 5 years  Vaping Use   Vaping Use: Never used  Substance and Sexual Activity   Alcohol use: Yes    Alcohol/week: 2.0 standard drinks    Types: 2 Cans of beer per week    Comment: occasional beer    Drug use: No   Sexual activity: Yes  Other Topics Concern   Not on file  Social History Narrative   HSG, community college 2 years. [employer]-3 years, E5 at discharge. Married  '65. 1 son '70, 1 dtr '69; 2 grandchildren. Work- drove Theatre manager now retired   Investment banker, operational of Radio broadcast assistant Strain: Low Risk    Difficulty of Paying Living Expenses: Not hard at [employer] Insecurity: No Food Insecurity   Worried About Charity fundraiser in the Last Year: Never true   Arboriculturist in the Last Year: Never true  Transportation Needs: No Data processing manager (Medical): No   Lack of Transportation (Non-Medical): No  Physical Activity: Sufficiently Active   Days of Exercise per Week: 5 days   Minutes of Exercise per Session: 30 min  Stress: No Stress Concern Present   Feeling of Stress : Not at all  Social Connections: Socially Integrated   Frequency of Communication with Friends and Family: More than three times a week   Frequency of Social Gatherings with Friends and Family: More than three times a week   Attends Religious Services: More than 4 times per year   Active Member of [employer] or Organizations: Yes   Attends Music therapist: More than 4 times per year   Marital Status: Married    Tobacco Counseling Counseling given: Not Answered Tobacco comments: Smoked until 83/ smoked pipe x 5 years   Clinical Intake:  Pre-visit preparation completed: Yes  Pain : No/denies pain Pain Score: 0-No pain     BMI - recorded: 26.5 Nutritional Risks: None Diabetes: Yes (CBG:  179) CBG done?: No Did pt. bring in CBG monitor from home?: No  How often do you need to have someone help you when you read instructions, pamphlets, or other written materials from your doctor or pharmacy?: 1 - Never What is the last grade level you completed in school?: HSG; [employer] for 3 years  Diabetic? yes  Interpreter Needed?: No  Information entered by :: Lisette Abu, LPN.   Activities of Daily Living    07/08/2021   12:36 PM  In your present state of health, do you have any difficulty performing the following activities:  Hearing? 0  Vision? 0  Difficulty concentrating or making decisions? 0  Walking or climbing stairs? 0  Dressing or bathing? 0  Doing errands, shopping? 0  Preparing Food and eating ? N  Using the Toilet? N  In the past six months, have you accidently leaked urine? N  Do you have problems with loss of bowel control? N  Managing your Medications? N  Managing your Finances? N  Housekeeping or managing your Housekeeping? N    Patient Care Team: Janith Lima, MD as PCP - General (Internal Medicine) Rhoton, Shelda Altes., MD (Gastroenterology) [place], Ritesh, OD (Optometry)  Indicate any recent Medical Services you may have received from other than [facility] providers in the past year (date may be approximate).     Assessment:   This is a routine wellness examination for Hudson.  Hearing/Vision screen Hearing Screening - Comments:: Patient denied any hearing difficulties. No hearing aids. Vision Screening - Comments:: Patient wears corrective lenses.  Dietary issues and exercise activities discussed: Current Exercise Habits: Home exercise routine, Type of exercise: walking, Time (Minutes): 30, Frequency (Times/Week): 5, Weekly Exercise (Minutes/Week): 150, Intensity: Moderate, Exercise limited by: None identified   Goals Addressed             This Visit's Progress    My goal is to stay well and healthy.        Depression Screen     07/08/2021   12:32 PM 07/03/2020   11:43 AM 05/18/2019   10:19 AM 04/18/2018    9:37 AM 03/03/2017    1:01 PM 11/30/2015    5:07 PM 11/05/2015    1:29 PM  PHQ 2/9 Scores  PHQ - 2 Score 0 0 0 0 0 0 0    Fall Risk    07/08/2021   12:36 PM 07/03/2020   11:45 AM 05/18/2019   10:19 AM 04/18/2018    9:37 AM 03/03/2017    1:01 PM  [place] in the past year? 0 0 0 0 No  Number falls in past yr: 0 0 0    Injury with Fall? 0 0 0    Risk for fall due to : No Fall Risks No Fall Risks No Fall Risks    Follow up Falls evaluation completed  Falls evaluation completed;Education provided;Falls prevention discussed      FALL RISK PREVENTION PERTAINING TO THE HOME:  Any stairs in or around the home? No  If so, are there any without handrails? No  Home free of loose throw rugs in walkways, pet beds, electrical cords, etc? Yes  Adequate lighting in your home to reduce risk of falls? Yes   ASSISTIVE DEVICES UTILIZED TO PREVENT FALLS:  Life alert? No  Use of a cane, walker or w/c? No  Grab bars in the bathroom? Yes  Shower chair or bench in shower? Yes  Elevated toilet seat or a handicapped toilet? Yes   TIMED UP AND GO:  Was the test performed? Yes .  Length of time to ambulate 10 feet: 6 sec.   Gait steady and fast without use of assistive device  Cognitive Function:    02/22/2017   11:11 AM  MMSE - Mini Mental State Exam  Orientation  to time 5  Orientation to Place 5  Registration 3  Attention/ Calculation 5  Recall 2  Language- name 2 objects 2  Language- repeat 1  Language- follow 3 step command 3  Language- read & follow direction 1  Write a sentence 1  Copy design 1  Total score 29        07/08/2021   12:37 PM 05/18/2019   10:23 AM  6CIT Screen  What Year? 0 points 0 points  What month? 0 points 0 points  What time? 0 points 0 points  Count back from 20 0 points 0 points  Months in reverse 0 points 0 points  Repeat phrase 0 points 0 points  Total Score 0 points 0  points    Immunizations Immunization History  Administered Date(s) Administered   PFIZER(Purple Top)SARS-COV-2 Vaccination 02/28/2019, 03/31/2019   Pneumococcal Conjugate-13 08/21/2013   Pneumococcal Polysaccharide-23 10/25/2012, 07/08/2020   Td 03/07/2010   Tdap 07/08/2020    TDAP status: Up to date  Flu Vaccine status: Declined, Education has been provided regarding the importance of this vaccine but patient still declined. Advised may receive this vaccine at local pharmacy or Health Dept. Aware to provide a copy of the vaccination record if obtained from local pharmacy or Health Dept. Verbalized acceptance and understanding.  Pneumococcal vaccine status: Up to date  Covid-19 vaccine status: Completed vaccines  Qualifies for Shingles Vaccine? Yes   Zostavax completed No   Shingrix Completed?: No.    Education has been provided regarding the importance of this vaccine. Patient has been advised to call insurance company to determine out of pocket expense if they have not yet received this vaccine. Advised may also receive vaccine at local pharmacy or Health Dept. Verbalized acceptance and understanding.  Screening Tests Health Maintenance  Topic Date Due   Zoster Vaccines- Shingrix (1 of 2) Never done   COVID-19 Vaccine (3 - Pfizer risk series) 04/28/2019   OPHTHALMOLOGY EXAM  05/30/2021   HEMOGLOBIN A1C  07/03/2021   FOOT EXAM  07/08/2021   INFLUENZA VACCINE  09/16/2021   URINE MICROALBUMIN  01/03/2022   TETANUS/TDAP  07/09/2030   Pneumonia Vaccine 73+ Years old  Completed   Hepatitis C Screening  Completed   HPV VACCINES  Aged Out   COLONOSCOPY (Pts 45-44yr Insurance coverage will need to be confirmed)  Discontinued    Health Maintenance  Health Maintenance Due  Topic Date Due   Zoster Vaccines- Shingrix (1 of 2) Never done   COVID-19 Vaccine (3 - Pfizer risk series) 04/28/2019   OPHTHALMOLOGY EXAM  05/30/2021   HEMOGLOBIN A1C  07/03/2021   FOOT EXAM  07/08/2021     Colorectal cancer screening: No longer required.   Lung Cancer Screening: (Low Dose CT Chest recommended if Age 78-80years, 30 pack-year currently smoking OR have quit w/in 15years.) does not qualify.   Lung Cancer Screening Referral: no  Additional Screening:  Hepatitis C Screening: does qualify; Completed 04/29/2017  Vision Screening: Recommended annual ophthalmology exams for early detection of glaucoma and other disorders of the eye. Is the patient up to date with their annual eye exam?  Yes  Who is the provider or what is the name of the office in which the patient attends annual eye exams? Ritesh Poudyal, OD.  If pt is not established with a provider, would they like to be referred to a provider to establish care? No .   Dental Screening: Recommended annual dental exams for proper oral hygiene  Community  Resource Referral / Chronic Care Management: CRR required this visit?  No   CCM required this visit?  No      Plan:     I have personally reviewed and noted the following in the patient's chart:   Medical and social history Use of alcohol, tobacco or illicit drugs  Current medications and supplements including opioid prescriptions. Patient is not currently taking opioid prescriptions. Functional ability and status Nutritional status Physical activity Advanced directives List of other physicians Hospitalizations, surgeries, and ER visits in previous 12 months Vitals Screenings to include cognitive, depression, and falls Referrals and appointments  In addition, I have reviewed and discussed with patient certain preventive protocols, quality metrics, and best practice recommendations. A written personalized care plan for preventive services as well as general preventive health recommendations were provided to patient.     Sheral Flow, LPN   08/14/6379   Nurse Notes:  Hearing Screening - Comments:: Patient denied any hearing difficulties. No hearing  aids. Vision Screening - Comments:: Patient wears corrective lenses.

## 2021-07-16 ENCOUNTER — Ambulatory Visit (INDEPENDENT_AMBULATORY_CARE_PROVIDER_SITE_OTHER): Payer: Medicare HMO | Admitting: Internal Medicine

## 2021-07-16 ENCOUNTER — Encounter: Payer: Self-pay | Admitting: Internal Medicine

## 2021-07-16 VITALS — BP 118/70 | HR 69 | Temp 98.2°F | Ht 71.0 in | Wt 190.0 lb

## 2021-07-16 DIAGNOSIS — Z0001 Encounter for general adult medical examination with abnormal findings: Secondary | ICD-10-CM | POA: Diagnosis not present

## 2021-07-16 DIAGNOSIS — Z23 Encounter for immunization: Secondary | ICD-10-CM | POA: Insufficient documentation

## 2021-07-16 DIAGNOSIS — K219 Gastro-esophageal reflux disease without esophagitis: Secondary | ICD-10-CM | POA: Diagnosis not present

## 2021-07-16 DIAGNOSIS — E118 Type 2 diabetes mellitus with unspecified complications: Secondary | ICD-10-CM

## 2021-07-16 DIAGNOSIS — K7581 Nonalcoholic steatohepatitis (NASH): Secondary | ICD-10-CM

## 2021-07-16 LAB — HEPATIC FUNCTION PANEL
ALT: 44 U/L (ref 0–53)
AST: 33 U/L (ref 0–37)
Albumin: 4.2 g/dL (ref 3.5–5.2)
Alkaline Phosphatase: 79 U/L (ref 39–117)
Bilirubin, Direct: 0.1 mg/dL (ref 0.0–0.3)
Total Bilirubin: 0.7 mg/dL (ref 0.2–1.2)
Total Protein: 7.1 g/dL (ref 6.0–8.3)

## 2021-07-16 LAB — BASIC METABOLIC PANEL
BUN: 20 mg/dL (ref 6–23)
CO2: 26 mEq/L (ref 19–32)
Calcium: 9.6 mg/dL (ref 8.4–10.5)
Chloride: 103 mEq/L (ref 96–112)
Creatinine, Ser: 0.96 mg/dL (ref 0.40–1.50)
GFR: 75.87 mL/min (ref >60.00)
Glucose, Bld: 148 mg/dL — ABNORMAL HIGH (ref 70–99)
Potassium: 4.7 mEq/L (ref 3.5–5.1)
Sodium: 137 mEq/L (ref 135–145)

## 2021-07-16 LAB — CBC WITH DIFFERENTIAL/PLATELET
Basophils Absolute: 0.1 10*3/uL (ref 0.0–0.1)
Basophils Relative: 0.9 % (ref 0.0–3.0)
Eosinophils Absolute: 0.3 10*3/uL (ref 0.0–0.7)
Eosinophils Relative: 4.2 % (ref 0.0–5.0)
HCT: 40.3 % (ref 39.0–52.0)
Hemoglobin: 13.3 g/dL (ref 13.0–17.0)
Lymphocytes Relative: 31.2 % (ref 12.0–46.0)
Lymphs Abs: 2 10*3/uL (ref 0.7–4.0)
MCHC: 33 g/dL (ref 30.0–36.0)
MCV: 91.3 fl (ref 78.0–100.0)
Monocytes Absolute: 0.6 10*3/uL (ref 0.1–1.0)
Monocytes Relative: 9.3 % (ref 3.0–12.0)
Neutro Abs: 3.5 10*3/uL (ref 1.4–7.7)
Neutrophils Relative %: 54.4 % (ref 43.0–77.0)
Platelets: 148 10*3/uL — ABNORMAL LOW (ref 150.0–400.0)
RBC: 4.42 Mil/uL (ref 4.22–5.81)
RDW: 14.2 % (ref 11.5–15.5)
WBC: 6.5 10*3/uL (ref 4.0–10.5)

## 2021-07-16 LAB — HEMOGLOBIN A1C: Hgb A1c MFr Bld: 8.1 % — ABNORMAL HIGH (ref 4.6–6.5)

## 2021-07-16 MED ORDER — SHINGRIX 50 MCG/0.5ML IM SUSR: 0.5000 mL | Freq: Once | INTRAMUSCULAR | 1 refills | Status: AC

## 2021-07-16 MED ORDER — DAPAGLIFLOZIN PROPANEDIOL 10 MG PO TABS: 10.0000 mg | ORAL_TABLET | Freq: Every day | ORAL | 1 refills | Status: DC

## 2021-07-16 NOTE — Patient Instructions (Signed)

## 2021-07-16 NOTE — Progress Notes (Unsigned)
Subjective:  Patient ID: Angel Rodgers, male    DOB: 1945-04-29  Age: 76 y.o. MRN: [id number]  CC: Diabetes, Hyperlipidemia, and Annual Exam   HPI Valor Quaintance presents for f/up -   He is active and denies DOE, SOB, CP, edema.  Outpatient Medications Prior to Visit  Medication Sig Dispense Refill   ACCU-CHEK AVIVA PLUS test strip CHECK BLOOD SUGAR TWICE DAILY 200 strip 2   Alcohol Swabs (B-D SINGLE USE SWABS REGULAR) PADS Use as directed top check blood sugars dx E11.8 100 each 3   Blood Glucose Calibration (ACCU-CHEK SMARTVIEW CONTROL) LIQD Use BID dx e11.8 3 each 3   Blood Glucose Monitoring Suppl (ACCU-CHEK AVIVA PLUS) w/Device KIT USE AS DIRECTED 1 kit 2   Lancets (ACCU-CHEK SOFT TOUCH) lancets Use to check blood sugar twice daily. DX: E11.9 200 each 3   metFORMIN (GLUCOPHAGE) 1000 MG tablet Take 1 tablet (1,000 mg total) by mouth 2 (two) times daily with a meal. 180 tablet 0   pantoprazole (PROTONIX) 40 MG tablet Take 1 tablet (40 mg total) by mouth daily. **CONTACT OFFICE TO SCHEDULE FOLLOW UP 90 tablet 0   pioglitazone (ACTOS) 30 MG tablet Take 1 tablet (30 mg total) by mouth daily. 90 tablet 0   simvastatin (ZOCOR) 20 MG tablet Take 1 tablet (20 mg total) by mouth at bedtime. 90 tablet 0   No facility-administered medications prior to visit.    ROS Review of Systems  Constitutional:  Negative for diaphoresis, fatigue and unexpected weight change.  HENT: Negative.    Eyes: Negative.   Respiratory:  Negative for cough, chest tightness, shortness of breath and wheezing.   Cardiovascular:  Negative for chest pain, palpitations and leg swelling.  Gastrointestinal:  Negative for abdominal pain, constipation, diarrhea, nausea and vomiting.  Genitourinary: Negative.  Negative for difficulty urinating.  Musculoskeletal: Negative.   Skin: Negative.   Neurological:  Negative for dizziness, weakness and light-headedness.  Hematological:  Negative for adenopathy. Does not  bruise/bleed easily.  Psychiatric/Behavioral: Negative.     Objective:  BP 118/70 (BP Location: Right Arm, Patient Position: Sitting, Cuff Size: Large)   Pulse 69   Temp 98.2 F (36.8 C) (Oral)   Ht 5' 11"  (1.803 m)   Wt 190 lb (86.2 kg)   SpO2 95%   BMI 26.50 kg/m   BP Readings from Last 3 Encounters:  07/16/21 118/70  07/08/21 110/78  01/03/21 112/78    Wt Readings from Last 3 Encounters:  07/16/21 190 lb (86.2 kg)  07/08/21 190 lb (86.2 kg)  01/03/21 192 lb (87.1 kg)    Physical Exam Vitals reviewed.  Constitutional:      Appearance: Normal appearance.  HENT:     Nose: Nose normal.     Mouth/Throat:     Mouth: Mucous membranes are moist.  Eyes:     General: No scleral icterus.    Conjunctiva/sclera: Conjunctivae normal.  Cardiovascular:     Rate and Rhythm: Normal rate and regular rhythm.     Heart sounds: No murmur heard. Pulmonary:     Effort: Pulmonary effort is normal.     Breath sounds: No stridor. No wheezing or rhonchi.  Abdominal:     General: Abdomen is flat.     Palpations: There is no mass.     Tenderness: There is no abdominal tenderness. There is no guarding.     Hernia: No hernia is present.  Musculoskeletal:        General: Normal range of motion.  Cervical back: Neck supple.     Right lower leg: No edema.     Left lower leg: No edema.  Lymphadenopathy:     Cervical: No cervical adenopathy.  Skin:    General: Skin is warm and dry.  Neurological:     General: No focal deficit present.     Mental Status: He is alert.  Psychiatric:        Mood and Affect: Mood normal.        Behavior: Behavior normal.    Lab Results  Component Value Date   WBC 6.5 07/16/2021   HGB 13.3 07/16/2021   HCT 40.3 07/16/2021   PLT 148.0 (L) 07/16/2021   GLUCOSE 148 (H) 07/16/2021   CHOL 125 01/03/2021   TRIG 55.0 01/03/2021   HDL 56.20 01/03/2021   LDLDIRECT 169.8 02/21/2010   LDLCALC 58 01/03/2021   ALT 44 07/16/2021   AST 33 07/16/2021   NA  137 07/16/2021   K 4.7 07/16/2021   CL 103 07/16/2021   CREATININE 0.96 07/16/2021   BUN 20 07/16/2021   CO2 26 07/16/2021   TSH 2.38 07/08/2020   PSA 0.46 06/22/2018   INR 1.1 (H) 07/08/2020   HGBA1C 8.1 (H) 07/16/2021   MICROALBUR <0.7 01/03/2021    VAS [place] AAA DUPLEX  Result Date: 04/20/2019 ABDOMINAL AORTA STUDY Indications: Follow up exam for known AAA. Risk Factors: Hyperlipidemia, Diabetes, past history of smoking. Limitations: Air/bowel gas.  Comparison Study: 03/26/2017 - [facility]                   Abdomen Complete Ultrasound: 3 cm Aorta noted. Performing Technologist: Caralee Ates BA, RVT, RDMS  Examination Guidelines: A complete evaluation includes B-mode imaging, spectral Doppler, color Doppler, and power Doppler as needed of all accessible portions of each vessel. Bilateral testing is considered an integral part of a complete examination. Limited examinations for reoccurring indications may be performed as noted.  Abdominal Aorta Findings: +-----------+-------+----------+----------+--------+--------+--------+ Location   AP (cm)Trans (cm)PSV (cm/s)WaveformThrombusComments +-----------+-------+----------+----------+--------+--------+--------+ Supraceliac2.76   2.70      60        biphasic                 +-----------+-------+----------+----------+--------+--------+--------+ Proximal   2.73   2.76      50        biphasic                 +-----------+-------+----------+----------+--------+--------+--------+ Mid        2.38   2.63      79        biphasic                 +-----------+-------+----------+----------+--------+--------+--------+ Distal     3.04   3.31      50        biphasic                 +-----------+-------+----------+----------+--------+--------+--------+ RT CIA Prox1.4    1.5       77        biphasic                 +-----------+-------+----------+----------+--------+--------+--------+ RT CIA  Mid 1.4    1.5       102       biphasic                 +-----------+-------+----------+----------+--------+--------+--------+ Visualization of the Super[place]iac artery and Proximal Abdominal Aorta was limited.  Summary: Abdominal Aorta:  There is evidence of abnormal dilatation of the distal Abdominal aorta. The largest aortic measurement is 3.3 cm.  *See table(s) above for measurements and observations.  Electronically signed by Ruta Hinds MD on 04/20/2019 at 8:31:47 AM.   Final     Assessment & Plan:   Jamaris was seen today for diabetes, hyperlipidemia and annual exam.  Diagnoses and all orders for this visit:  Type 2 diabetes mellitus with manifestations ([place])- His A1c is up to 8.1% and he has very mild renal insufficiency.  I recommend that he start taking Farxiga for renal protection. -     Basic metabolic panel; Future -     Hemoglobin A1c; Future -     HM Diabetes Foot Exam -     Ambulatory referral to Ophthalmology -     Hemoglobin A1c -     Basic metabolic panel -     dapagliflozin propanediol (FARXIGA) 10 MG TABS tablet; Take 1 tablet (10 mg total) by mouth daily before breakfast.  Gastroesophageal reflux disease without esophagitis- Symptoms are well controlled with the PPI. -     CBC with Differential/Platelet; Future -     CBC with Differential/Platelet  NASH (nonalcoholic steatohepatitis)- His LFTs are not concerning. -     Hepatic function panel; Future -     Hepatic function panel  Need for varicella vaccine -     Zoster Vaccine Adjuvanted [facility]) injection; Inject 0.5 mLs into the muscle once for 1 dose.  Encounter for general adult medical examination with abnormal findings- Exam completed, labs reviewed, vaccines reviewed and updated, cancer screenings are up-to-date, patient education was given.   I am having Honor Junes start on Shingrix and dapagliflozin propanediol. I am also having him maintain his B-D SINGLE USE SWABS REGULAR, Accu-Chek SmartView  Control, accu-chek soft touch, Accu-Chek Aviva Plus, Accu-Chek Aviva Plus, pantoprazole, pioglitazone, metFORMIN, and simvastatin.  Meds ordered this encounter  Medications   Zoster Vaccine Adjuvanted [facility]) injection    Sig: Inject 0.5 mLs into the muscle once for 1 dose.    Dispense:  0.5 mL    Refill:  1   dapagliflozin propanediol (FARXIGA) 10 MG TABS tablet    Sig: Take 1 tablet (10 mg total) by mouth daily before breakfast.    Dispense:  100 tablet    Refill:  1     Follow-up: Return in about 6 months (around 01/15/2022).  Scarlette Calico, MD

## 2021-09-03 ENCOUNTER — Other Ambulatory Visit: Payer: Self-pay | Admitting: Internal Medicine

## 2021-09-03 DIAGNOSIS — E118 Type 2 diabetes mellitus with unspecified complications: Secondary | ICD-10-CM

## 2021-09-03 DIAGNOSIS — E785 Hyperlipidemia, unspecified: Secondary | ICD-10-CM

## 2021-09-22 ENCOUNTER — Other Ambulatory Visit: Payer: Self-pay | Admitting: Physician Assistant

## 2021-11-13 ENCOUNTER — Other Ambulatory Visit: Payer: Self-pay

## 2021-11-13 ENCOUNTER — Encounter (HOSPITAL_COMMUNITY): Payer: Self-pay

## 2021-11-13 ENCOUNTER — Emergency Department (HOSPITAL_COMMUNITY): Payer: Medicare HMO

## 2021-11-13 ENCOUNTER — Emergency Department (HOSPITAL_COMMUNITY)
Admission: EM | Admit: 2021-11-13 | Discharge: 2021-11-14 | Disposition: A | Discharge status: Home / Self Care | Payer: Medicare HMO | Attending: Emergency Medicine | Admitting: Emergency Medicine

## 2021-11-13 DIAGNOSIS — Z20822 Contact with and (suspected) exposure to covid-19: Secondary | ICD-10-CM | POA: Insufficient documentation

## 2021-11-13 DIAGNOSIS — R000 Tachycardia, unspecified: Secondary | ICD-10-CM | POA: Insufficient documentation

## 2021-11-13 DIAGNOSIS — K760 Fatty (change of) liver, not elsewhere classified: Secondary | ICD-10-CM | POA: Diagnosis not present

## 2021-11-13 DIAGNOSIS — R197 Diarrhea, unspecified: Secondary | ICD-10-CM | POA: Diagnosis not present

## 2021-11-13 DIAGNOSIS — Z794 Long term (current) use of insulin: Secondary | ICD-10-CM | POA: Diagnosis not present

## 2021-11-13 DIAGNOSIS — Z7984 Long term (current) use of oral hypoglycemic drugs: Secondary | ICD-10-CM | POA: Diagnosis not present

## 2021-11-13 DIAGNOSIS — R109 Unspecified abdominal pain: Secondary | ICD-10-CM | POA: Diagnosis not present

## 2021-11-13 DIAGNOSIS — K591 Functional diarrhea: Secondary | ICD-10-CM | POA: Diagnosis not present

## 2021-11-13 DIAGNOSIS — E119 Type 2 diabetes mellitus without complications: Secondary | ICD-10-CM | POA: Insufficient documentation

## 2021-11-13 DIAGNOSIS — R6883 Chills (without fever): Secondary | ICD-10-CM | POA: Diagnosis not present

## 2021-11-13 DIAGNOSIS — E860 Dehydration: Secondary | ICD-10-CM | POA: Diagnosis not present

## 2021-11-13 DIAGNOSIS — D72829 Elevated white blood cell count, unspecified: Secondary | ICD-10-CM | POA: Insufficient documentation

## 2021-11-13 DIAGNOSIS — R509 Fever, unspecified: Secondary | ICD-10-CM | POA: Diagnosis not present

## 2021-11-13 DIAGNOSIS — K8020 Calculus of gallbladder without cholecystitis without obstruction: Secondary | ICD-10-CM

## 2021-11-13 LAB — CBC WITH DIFFERENTIAL/PLATELET
Abs Immature Granulocytes: 0.04 10*3/uL (ref 0.00–0.07)
Basophils Absolute: 0 10*3/uL (ref 0.0–0.1)
Basophils Relative: 0 %
Eosinophils Absolute: 0 10*3/uL (ref 0.0–0.5)
Eosinophils Relative: 0 %
HCT: 42.2 % (ref 39.0–52.0)
Hemoglobin: 14.2 g/dL (ref 13.0–17.0)
Immature Granulocytes: 0 %
Lymphocytes Relative: 10 %
Lymphs Abs: 1.1 10*3/uL (ref 0.7–4.0)
MCH: 30.7 pg (ref 26.0–34.0)
MCHC: 33.6 g/dL (ref 30.0–36.0)
MCV: 91.1 fL (ref 80.0–100.0)
Monocytes Absolute: 1.2 10*3/uL — ABNORMAL HIGH (ref 0.1–1.0)
Monocytes Relative: 11 %
Neutro Abs: 9.3 10*3/uL — ABNORMAL HIGH (ref 1.7–7.7)
Neutrophils Relative %: 79 %
Platelets: 134 10*3/uL — ABNORMAL LOW (ref 150–400)
RBC: 4.63 MIL/uL (ref 4.22–5.81)
RDW: 13.3 % (ref 11.5–15.5)
WBC: 11.7 10*3/uL — ABNORMAL HIGH (ref 4.0–10.5)
nRBC: 0 % (ref 0.0–0.2)

## 2021-11-13 LAB — COMPREHENSIVE METABOLIC PANEL
ALT: 38 U/L (ref 0–44)
AST: 25 U/L (ref 15–41)
Albumin: 3.9 g/dL (ref 3.5–5.0)
Alkaline Phosphatase: 80 U/L (ref 38–126)
Anion gap: 12 (ref 5–15)
BUN: 14 mg/dL (ref 8–23)
CO2: 22 mmol/L (ref 22–32)
Calcium: 9.2 mg/dL (ref 8.9–10.3)
Chloride: 99 mmol/L (ref 98–111)
Creatinine, Ser: 1.04 mg/dL (ref 0.61–1.24)
GFR, Estimated: >60 mL/min (ref >60)
Glucose, Bld: 187 mg/dL — ABNORMAL HIGH (ref 70–99)
Potassium: 3.9 mmol/L (ref 3.5–5.1)
Sodium: 133 mmol/L — ABNORMAL LOW (ref 135–145)
Total Bilirubin: 0.9 mg/dL (ref 0.3–1.2)
Total Protein: 7.4 g/dL (ref 6.5–8.1)

## 2021-11-13 LAB — URINALYSIS, ROUTINE W REFLEX MICROSCOPIC
Bacteria, UA: NONE SEEN
Bilirubin Urine: NEGATIVE
Glucose, UA: >=500 mg/dL — AB
Hgb urine dipstick: NEGATIVE
Ketones, ur: 5 mg/dL — AB
Leukocytes,Ua: NEGATIVE
Nitrite: NEGATIVE
Protein, ur: NEGATIVE mg/dL
Specific Gravity, Urine: 1.02 (ref 1.005–1.030)
pH: 5 (ref 5.0–8.0)

## 2021-11-13 LAB — RESP PANEL BY RT-PCR (FLU A&B, COVID) ARPGX2
Influenza A by PCR: NEGATIVE
Influenza B by PCR: NEGATIVE
SARS Coronavirus 2 by RT PCR: NEGATIVE

## 2021-11-13 LAB — LACTIC ACID, PLASMA: Lactic Acid, Venous: 2.1 mmol/L (ref 0.5–1.9)

## 2021-11-13 LAB — PROTIME-INR
INR: 1.2 (ref 0.8–1.2)
Prothrombin Time: 14.9 seconds (ref 11.4–15.2)

## 2021-11-13 LAB — MAGNESIUM: Magnesium: 1.5 mg/dL — ABNORMAL LOW (ref 1.7–2.4)

## 2021-11-13 MED ORDER — MAGNESIUM SULFATE 2 GM/50ML IV SOLN
2.0000 g | Freq: Once | INTRAVENOUS | Status: AC
  Administered 2021-11-13: 2 g via INTRAVENOUS
  Filled 2021-11-13: qty 50

## 2021-11-13 MED ORDER — ONDANSETRON 4 MG PO TBDP: 4.0000 mg | ORAL_TABLET | Freq: Once | ORAL | Status: AC

## 2021-11-13 MED ORDER — SODIUM CHLORIDE 0.9 % IV BOLUS
1000.0000 mL | Freq: Once | INTRAVENOUS | Status: AC
  Administered 2021-11-13: 1000 mL via INTRAVENOUS

## 2021-11-13 MED ORDER — SODIUM CHLORIDE 0.9 % IV BOLUS
1000.0000 mL | Freq: Once | INTRAVENOUS | Status: AC
  Administered 2021-11-14: 1000 mL via INTRAVENOUS

## 2021-11-13 MED ORDER — IOHEXOL 350 MG/ML SOLN
75.0000 mL | Freq: Once | INTRAVENOUS | Status: AC | PRN
  Administered 2021-11-13: 75 mL via INTRAVENOUS

## 2021-11-13 MED ORDER — ONDANSETRON 4 MG PO TBDP
ORAL_TABLET | ORAL | Status: AC
  Administered 2021-11-13: 4 mg via ORAL
  Filled 2021-11-13: qty 1

## 2021-11-13 MED ORDER — ACETAMINOPHEN 500 MG PO TABS
1000.0000 mg | ORAL_TABLET | Freq: Four times a day (QID) | ORAL | Status: DC | PRN
  Administered 2021-11-13: 1000 mg via ORAL
  Filled 2021-11-13: qty 2

## 2021-11-13 NOTE — ED Provider Triage Note (Signed)
Emergency Medicine Provider Triage Evaluation Note  Fergus Throne , a 76 y.o. male  was evaluated in triage.  Pt complains of diarrhea since Monday, fever, chills, and generalized weakness.  He had a syncopal episode today while he was in the shower.  He was sent here by Urgent care for concern for sepsis.   He denies cough, chest pain, shortness of breath, nausea, vomiting.  No recent travel.  No recent antibiotic use.   Review of Systems  Positive:  Negative:   Physical Exam  BP 131/83 (BP Location: Right Arm)   Pulse (!) 116   Temp (!) 102.2 F (39 C) (Oral)   Resp 18   Ht 5' 11"  (1.803 m)   Wt 86.2 kg   SpO2 94%   BMI 26.50 kg/m  Gen:   Awake, no distress   Resp:  Normal effort  MSK:   Moves extremities without difficulty  Other:    Medical Decision Making  Medically screening exam initiated at 1:01 PM.  Appropriate orders placed.  Ashaz Robling was informed that the remainder of the evaluation will be completed by another provider, this initial triage assessment does not replace that evaluation, and the importance of remaining in the ED until their evaluation is complete.  Code Sepsis initiated by triage RN. Patient will need room as soon as one is available.    Adolphus Birchwood, [place] 11/13/21 1303

## 2021-11-13 NOTE — ED Provider Notes (Signed)
[facility] EMERGENCY DEPARTMENT Provider Note   CSN: [id number] Arrival date & time: 11/13/21  1142     History  Chief Complaint  Patient presents with   Code Sepsis    Angel Rodgers is a 76 y.o. male.  HPI  The patient and the patient's wife, patient is felt generally unwell over the past 4 days.  Patient reportedly had been having persistent diarrhea in the past 4 days.  He also endorses that he had a subjective fever last night as he had chills and "was pouring sweat".  Patient denies any significant abdominal pain, nausea, vomiting, hematochezia, melena, dysuria, increased urinary frequency, chest pain, shortness of breath.  Patient states that he is in generally good health.  Patient's wife notes that he occasionally has episodes of diarrhea as he is on metformin but outside of that does not typically have any issues.  They have no recent exposure to C. difficile or recent travel.    Home Medications Prior to Admission medications   Medication Sig Start Date End Date Taking? Authorizing Provider  ACCU-CHEK AVIVA PLUS test strip CHECK BLOOD SUGAR TWICE DAILY 12/04/19   Janith Lima, MD  Alcohol Swabs (B-D SINGLE USE SWABS REGULAR) PADS Use as directed top check blood sugars dx E11.8 10/31/14   Janith Lima, MD  Blood Glucose Calibration (ACCU-CHEK SMARTVIEW CONTROL) LIQD Use BID dx e11.8 10/31/14   Janith Lima, MD  Blood Glucose Monitoring Suppl (ACCU-CHEK AVIVA PLUS) w/Device KIT USE AS DIRECTED 07/07/18   Janith Lima, MD  dapagliflozin propanediol (FARXIGA) 10 MG TABS tablet Take 1 tablet (10 mg total) by mouth daily before breakfast. 07/16/21   Janith Lima, MD  Lancets (ACCU-CHEK SOFT TOUCH) lancets Use to check blood sugar twice daily. DX: E11.9 04/30/17   Janith Lima, MD  metFORMIN (GLUCOPHAGE) 1000 MG tablet TAKE 1 TABLET TWICE DAILY WITH MEALS 09/03/21   Janith Lima, MD  pantoprazole (PROTONIX) 40 MG tablet Take 1 tablet (40 mg total)  by mouth daily. **CONTACT OFFICE TO SCHEDULE FOLLOW UP 04/17/21   Esterwood, Amy S, PA-C  pioglitazone (ACTOS) 30 MG tablet TAKE 1 TABLET EVERY DAY 09/03/21   Janith Lima, MD  simvastatin (ZOCOR) 20 MG tablet TAKE 1 TABLET AT BEDTIME 09/03/21   Janith Lima, MD      Allergies    Patient has no known allergies.    Review of Systems   Review of Systems  Physical Exam Updated Vital Signs BP 127/66   Pulse 81   Temp 100.2 F (37.9 C) (Oral)   Resp (!) 24   Ht 5' 11"  (1.803 m)   Wt 86.2 kg   SpO2 97%   BMI 26.50 kg/m  Physical Exam Vitals and nursing note reviewed.  Constitutional:      General: He is not in acute distress.    Appearance: He is well-developed.  HENT:     Head: Normocephalic and atraumatic.  Eyes:     Conjunctiva/sclera: Conjunctivae normal.  Cardiovascular:     Rate and Rhythm: Regular rhythm. Tachycardia present.     Heart sounds: No murmur heard. Pulmonary:     Effort: Pulmonary effort is normal. No respiratory distress.     Breath sounds: Normal breath sounds.  Abdominal:     Palpations: Abdomen is soft.     Tenderness: There is no abdominal tenderness.  Musculoskeletal:        General: No swelling.     Cervical  back: Neck supple.  Skin:    General: Skin is warm and dry.     Capillary Refill: Capillary refill takes 2 to 3 seconds.  Neurological:     Mental Status: He is alert.  Psychiatric:        Mood and Affect: Mood normal.     ED Results / Procedures / Treatments   Labs (all labs ordered are listed, but only abnormal results are displayed) Labs Reviewed  COMPREHENSIVE METABOLIC PANEL - Abnormal; Notable for the following components:      Result Value   Sodium 133 (*)    Glucose, Bld 187 (*)    All other components within normal limits  LACTIC ACID, PLASMA - Abnormal; Notable for the following components:   Lactic Acid, Venous 2.1 (*)    All other components within normal limits  CBC WITH DIFFERENTIAL/PLATELET - Abnormal; Notable  for the following components:   WBC 11.7 (*)    Platelets 134 (*)    Neutro Abs 9.3 (*)    Monocytes Absolute 1.2 (*)    All other components within normal limits  URINALYSIS, ROUTINE W REFLEX MICROSCOPIC - Abnormal; Notable for the following components:   Glucose, UA >=500 (*)    Ketones, ur 5 (*)    All other components within normal limits  MAGNESIUM - Abnormal; Notable for the following components:   Magnesium 1.5 (*)    All other components within normal limits  RESP PANEL BY RT-PCR (FLU A&B, COVID) ARPGX2  CULTURE, BLOOD (ROUTINE X 2)  CULTURE, BLOOD (ROUTINE X 2)  LACTIC ACID, PLASMA  PROTIME-INR    EKG None  Radiology US Abdomen Limited RUQ (LIVER/GB)  Result Date: 11/14/2021 CLINICAL DATA:  Abdominal pain EXAM: ULTRASOUND ABDOMEN LIMITED RIGHT UPPER QUADRANT COMPARISON:  None Available. FINDINGS: Gallbladder: A cluster of stones is seen near the gallbladder neck, although evaluation is somewhat limited by overlying bowel gas. No wall thickening or pericholecystic fluid. No sonographic Murphy sign noted by sonographer. Common bile duct: Diameter: 3 mm, within normal limits. No intrahepatic biliary ductal dilatation. Liver: No focal lesion identified. Within normal limits in parenchymal echogenicity. Portal vein is patent on color Doppler imaging with normal direction of blood flow towards the liver. Other: None. IMPRESSION: Cholelithiasis without evidence of acute cholecystitis. Electronically Signed   By: Merilyn Baba M.D.   On: 11/14/2021 00:47   CT ABDOMEN PELVIS W CONTRAST  Result Date: 11/13/2021 CLINICAL DATA:  Acute nonlocalized abdominal pain EXAM: CT ABDOMEN AND PELVIS WITH CONTRAST TECHNIQUE: Multidetector CT imaging of the abdomen and pelvis was performed using the standard protocol following bolus administration of intravenous contrast. RADIATION DOSE REDUCTION: This exam was performed according to the departmental dose-optimization program which includes automated  exposure control, adjustment of the mA and/or kV according to patient size and/or use of iterative reconstruction technique. CONTRAST:  5m OMNIPAQUE IOHEXOL 350 MG/ML SOLN COMPARISON:  MRI abdomen 04/10/2017 FINDINGS: Lower chest: Scarring/atelectasis in the lower lobes. No acute abnormality. Hepatobiliary: Hepatic steatosis. Benign hemangioma in the right hepatic lobe. Cholelithiasis. No evidence of cholecystitis. No biliary dilation. Pancreas: Unremarkable. No pancreatic ductal dilatation or surrounding inflammatory changes. Spleen: Normal in size without focal abnormality. Adrenals/Urinary Tract: Unremarkable adrenal glands. No suspicious renal lesion. No urinary calculi or hydronephrosis. Stomach/Bowel: Stomach is within normal limits. Appendix appears normal. No evidence of bowel wall thickening, distention, or inflammatory changes. There are few colonic diverticula without diverticulitis. Vascular/Lymphatic: Aortic atherosclerosis. No enlarged abdominal or pelvic lymph nodes. Reproductive: Prostate is unremarkable.  Other: No free intraperitoneal fluid or air. Musculoskeletal: No acute osseous abnormality. IMPRESSION: No acute abnormality in the abdomen or pelvis. Hepatic steatosis. Cholelithiasis. Electronically Signed   By: Placido Sou M.D.   On: 11/13/2021 23:47   DG Chest 2 View  Result Date: 11/13/2021 CLINICAL DATA:  sweats.  Chills.  Rule out sepsis.  Diabetes. EXAM: CHEST - 2 VIEW COMPARISON:  None Available. FINDINGS: Midline trachea. Normal heart size. Atherosclerosis in the transverse aorta. No pleural effusion or pneumothorax. Left base scarring or subsegmental atelectasis. No lobar consolidation. IMPRESSION: No acute cardiopulmonary disease. Left base scarring or subsegmental atelectasis. Electronically Signed   By: Abigail Miyamoto M.D.   On: 11/13/2021 13:36    Procedures Procedures    Medications Ordered in ED Medications  acetaminophen (TYLENOL) tablet 1,000 mg (1,000 mg Oral  Given 11/13/21 1407)  ondansetron (ZOFRAN-ODT) disintegrating tablet 4 mg (4 mg Oral Given 11/13/21 1407)  sodium chloride 0.9 % bolus 1,000 mL (0 mLs Intravenous Stopped 11/13/21 2330)  magnesium sulfate IVPB 2 g 50 mL (0 g Intravenous Stopped 11/13/21 2336)  iohexol (OMNIPAQUE) 350 MG/ML injection 75 mL (75 mLs Intravenous Contrast Given 11/13/21 2337)  sodium chloride 0.9 % bolus 1,000 mL (1,000 mLs Intravenous New Bag/Given 11/14/21 0004)    ED Course/ Medical Decision Making/ A&P                           Medical Decision Making Amount and/or Complexity of Data Reviewed Labs: ordered. Decision-making details documented in ED Course. Radiology: ordered. Decision-making details documented in ED Course.  Risk Prescription drug management.   Medical Decision Making  This patient is Presenting for Evaluation of diarrhea, which does require a range of treatment options, and is a complaint that involves a high risk of morbidity and mortality.  Arrived in ED by: POV  History obtained from: The patient, the patient's wife Limitations in history: none    At this time I am most concerned for viral gastroenteritis. Also considering bacterial gastroenteritis, intra-abdominal pathology, diverticulitis, diverticulosis, metabolic abnormality, dehydration, urinary tract. Plan for emergency work-up    Laboratory work-up significant for:  -Patient's CBC revealed a mild leukocytosis with a WBC of 11.7, mild left shift.  CMP revealed no significant metabolic abnormality.  Magnesium 1.5, repleted.  Urinalysis with no significant evidence of urinary tract infection.  Initial lactic acid 2.1.  COVID/influenza obtained   Radiologic work-up was significant for: -CT abdomen and pelvis unremarkable outside of evidence of cholelithiasis without evidence of cholecystitis.   Interventions and Interval History: -On arrival to the emergency department, patient was febrile with a temperature of 102.2 and was  tachycardic.  Patient was not hypotensive and was otherwise vitally stable.  Patient was treated symptomatically from triage for Zofran and Tylenol.  Patient had improvement of his heart rate with resolution of his fever.  On my initial exam, patient was generally well-appearing however he did appear dehydrated.  Due to the patient's low magnesium levels they were repleted the high clinical concern of this patient is likely dehydrated in the setting of multiple days of diarrhea.  Repeat abdominal exam reveals a benign abdomen however due to the patient's age, fevers, persistent diarrhea CT abdomen and pelvis was obtained which revealed evidence of cholelithiasis.  Right upper quadrant ultrasound was obtained due to that being the superior study to further evaluate the gallbladder.  Patient agreed and understood the plan.   -Patient did not meet SIRS criteria,  source is very likely viral in nature, broad-spectrum antibiotics were not initiated on my initial exam.       Disposition: Due to the patients current presenting symptoms, physical exam findings, and the workup stated above, it is thought that the etiology of the patients current presentation is likely viral gastroenteritis versus symptomatic cholelithiasis   Handoff: At the time of signout, the patients right upper quadrant ultrasound had not yet been completed. Handoff was provided to Dr. Christy Gentles , please see their note for additional treatment plan details.  Suspect that the patient may be appropriate for discharge should his right upper quadrant ultrasound be reassuring the patient remained hemodynamically stable and generally well-appearing and able to tolerate p.o. intake while in the ED   The plan for this patient was discussed with Dr. Lenor Derrick, who voiced agreement and who oversaw evaluation and treatment of this patient.     Clinical Complexity  A medically appropriate history, review of systems, and physical exam was performed.    I personally reviewed the lab and imaging studies discussed above.   MDM generated using voice dictation software and may contain dictation errors. Please contact me for any clarification or with any questions.            Final Clinical Impression(s) / ED Diagnoses Final diagnoses:  Diarrhea of presumed infectious origin    Rx / DC Orders ED Discharge Orders     None         Cam Dauphin, [place], MD 11/14/21 8588    Malvin Johns, MD 11/17/21 1601

## 2021-11-13 NOTE — ED Triage Notes (Addendum)
Reports chills and fever and and diarhead nausea since monday but not abd pain.  Denies urinary symptoms or cough  flu and covid were negative. Sent by PCP

## 2021-11-14 ENCOUNTER — Emergency Department (HOSPITAL_COMMUNITY): Payer: Medicare HMO

## 2021-11-14 ENCOUNTER — Encounter: Payer: Self-pay | Admitting: Internal Medicine

## 2021-11-14 DIAGNOSIS — R109 Unspecified abdominal pain: Secondary | ICD-10-CM | POA: Diagnosis not present

## 2021-11-14 DIAGNOSIS — K8020 Calculus of gallbladder without cholecystitis without obstruction: Secondary | ICD-10-CM | POA: Diagnosis not present

## 2021-11-14 LAB — LACTIC ACID, PLASMA: Lactic Acid, Venous: 1.9 mmol/L (ref 0.5–1.9)

## 2021-11-14 NOTE — ED Notes (Signed)
Pt transported to US

## 2021-11-14 NOTE — ED Notes (Signed)
Pt provided a cup of ice water for PO challenge

## 2021-11-14 NOTE — ED Provider Notes (Signed)
I assumed care in signout to reassess patient.  Patient presented with diarrhea and fever.  He had symptoms for several days. Overall patient appears improved.  Fever has improved.  Blood pressure is appropriate.  Lactate is clearing.  Imaging reveals cholelithiasis but no other acute findings. Patient is not septic appearing.  He is taking p.o. fluids.  Patient appears safe for discharge home Patient feels comfortable with plan.  He will follow-up next week with PCP if diarrhea continues.  We discussed strict return precautions   Ripley Fraise, MD 11/14/21 0202

## 2021-11-14 NOTE — ED Notes (Signed)
Pt returned from [place], standing at bedside to use commode

## 2021-11-18 ENCOUNTER — Telehealth: Payer: Self-pay | Admitting: *Deleted

## 2021-11-18 LAB — CULTURE, BLOOD (ROUTINE X 2)
Culture: NO GROWTH
Culture: NO GROWTH
Special Requests: ADEQUATE

## 2021-11-18 NOTE — Telephone Encounter (Signed)
        Patient  visited [place] ed on 11/14/2021  for diarrhea   Telephone encounter attempt :  1st  A HIPAA compliant voice message was left requesting a return call.  Instructed patient to call back at [phone]. [place] [phone] [address] , [place] [address] Email : [email or web address]

## 2021-11-19 ENCOUNTER — Encounter: Payer: Self-pay | Admitting: Internal Medicine

## 2021-11-19 ENCOUNTER — Telehealth: Payer: Self-pay | Admitting: *Deleted

## 2021-11-19 ENCOUNTER — Ambulatory Visit (INDEPENDENT_AMBULATORY_CARE_PROVIDER_SITE_OTHER): Payer: Medicare HMO | Admitting: Internal Medicine

## 2021-11-19 VITALS — BP 120/62 | HR 100 | Temp 98.4°F | Ht 71.0 in | Wt 186.0 lb

## 2021-11-19 DIAGNOSIS — E118 Type 2 diabetes mellitus with unspecified complications: Secondary | ICD-10-CM

## 2021-11-19 DIAGNOSIS — D696 Thrombocytopenia, unspecified: Secondary | ICD-10-CM | POA: Diagnosis not present

## 2021-11-19 DIAGNOSIS — D539 Nutritional anemia, unspecified: Secondary | ICD-10-CM | POA: Insufficient documentation

## 2021-11-19 LAB — FOLATE: Folate: 16 ng/mL (ref >5.9)

## 2021-11-19 LAB — BASIC METABOLIC PANEL
BUN: 12 mg/dL (ref 6–23)
CO2: 26 mEq/L (ref 19–32)
Calcium: 9.4 mg/dL (ref 8.4–10.5)
Chloride: 98 mEq/L (ref 96–112)
Creatinine, Ser: 0.81 mg/dL (ref 0.40–1.50)
GFR: 84.43 mL/min (ref >60.00)
Glucose, Bld: 214 mg/dL — ABNORMAL HIGH (ref 70–99)
Potassium: 3.9 mEq/L (ref 3.5–5.1)
Sodium: 133 mEq/L — ABNORMAL LOW (ref 135–145)

## 2021-11-19 LAB — CBC WITH DIFFERENTIAL/PLATELET
Basophils Absolute: 0.1 10*3/uL (ref 0.0–0.1)
Basophils Relative: 0.6 % (ref 0.0–3.0)
Eosinophils Absolute: 0.1 10*3/uL (ref 0.0–0.7)
Eosinophils Relative: 0.6 % (ref 0.0–5.0)
HCT: 37.2 % — ABNORMAL LOW (ref 39.0–52.0)
Hemoglobin: 12.6 g/dL — ABNORMAL LOW (ref 13.0–17.0)
Lymphocytes Relative: 19.9 % (ref 12.0–46.0)
Lymphs Abs: 2.1 10*3/uL (ref 0.7–4.0)
MCHC: 34 g/dL (ref 30.0–36.0)
MCV: 89.8 fl (ref 78.0–100.0)
Monocytes Absolute: 1.4 10*3/uL — ABNORMAL HIGH (ref 0.1–1.0)
Monocytes Relative: 13.2 % — ABNORMAL HIGH (ref 3.0–12.0)
Neutro Abs: 7 10*3/uL (ref 1.4–7.7)
Neutrophils Relative %: 65.7 % (ref 43.0–77.0)
Platelets: 182 10*3/uL (ref 150.0–400.0)
RBC: 4.15 Mil/uL — ABNORMAL LOW (ref 4.22–5.81)
RDW: 14.2 % (ref 11.5–15.5)
WBC: 10.6 10*3/uL — ABNORMAL HIGH (ref 4.0–10.5)

## 2021-11-19 LAB — HEMOGLOBIN A1C: Hgb A1c MFr Bld: 8.3 % — ABNORMAL HIGH (ref 4.6–6.5)

## 2021-11-19 LAB — VITAMIN B12: Vitamin B-12: 361 pg/mL (ref 211–911)

## 2021-11-19 LAB — MAGNESIUM: Magnesium: 1.6 mg/dL (ref 1.5–2.5)

## 2021-11-19 NOTE — Telephone Encounter (Signed)
        Patient  visited [place] ed on 11/14/2021  for diarrhea   Telephone encounter attempt :  2nd  A HIPAA compliant voice message was left requesting a return call.  Instructed patient to call back at [phone].  [place] [phone] [address] , [place] [address] Email : [email or web address]

## 2021-11-19 NOTE — Progress Notes (Unsigned)
Subjective:  Patient ID: Angel Rodgers, male    DOB: [birth year]  Age: 76 y.o. MRN: [id number]  CC: Diabetes and Anemia   HPI Angel Rodgers presents for f/up -  About 6 days ago he developed an acute diarrheal illness with weakness, low-grade fever, and chills.  He tells me his symptoms are rapidly improving.  He is getting good symptom relief with Imodium.  He denies nausea, vomiting, loss of appetite, or weight loss.  He was seen in the ED and found to have a low magnesium level.  Outpatient Medications Prior to Visit  Medication Sig Dispense Refill   ACCU-CHEK AVIVA PLUS test strip CHECK BLOOD SUGAR TWICE DAILY 200 strip 2   Alcohol Swabs (B-D SINGLE USE SWABS REGULAR) PADS Use as directed top check blood sugars dx E11.8 100 each 3   Blood Glucose Calibration (ACCU-CHEK SMARTVIEW CONTROL) LIQD Use BID dx e11.8 3 each 3   Blood Glucose Monitoring Suppl (ACCU-CHEK AVIVA PLUS) w/Device KIT USE AS DIRECTED 1 kit 2   dapagliflozin propanediol (FARXIGA) 10 MG TABS tablet Take 1 tablet (10 mg total) by mouth daily before breakfast. 100 tablet 1   Lancets (ACCU-CHEK SOFT TOUCH) lancets Use to check blood sugar twice daily. DX: E11.9 200 each 3   metFORMIN (GLUCOPHAGE) 1000 MG tablet TAKE 1 TABLET TWICE DAILY WITH MEALS 180 tablet 0   pantoprazole (PROTONIX) 40 MG tablet Take 1 tablet (40 mg total) by mouth daily. **CONTACT OFFICE TO SCHEDULE FOLLOW UP 90 tablet 0   pioglitazone (ACTOS) 30 MG tablet TAKE 1 TABLET EVERY DAY 90 tablet 0   simvastatin (ZOCOR) 20 MG tablet TAKE 1 TABLET AT BEDTIME 90 tablet 0   No facility-administered medications prior to visit.    ROS Review of Systems  Constitutional:  Negative for chills, diaphoresis, fatigue and fever.  HENT: Negative.    Eyes: Negative.   Respiratory:  Negative for cough, chest tightness, shortness of breath and wheezing.   Cardiovascular:  Negative for chest pain, palpitations and leg swelling.  Gastrointestinal:  Positive for diarrhea.  Negative for abdominal pain, blood in stool, nausea and vomiting.  Endocrine: Negative.   Genitourinary: Negative.  Negative for difficulty urinating.  Musculoskeletal: Negative.   Skin: Negative.   Neurological:  Negative for dizziness, weakness and light-headedness.  Hematological:  Negative for adenopathy. Does not bruise/bleed easily.  Psychiatric/Behavioral: Negative.      Objective:  BP 120/62 (BP Location: Left Arm, Patient Position: Sitting, Cuff Size: Large)   Pulse 100   Temp 98.4 F (36.9 C) (Oral)   Ht _0  (1.803 m)   Wt 186 lb (84.4 kg)   SpO2 94%   BMI 25.94 kg/m   BP Readings from Last 3 Encounters:  11/24/21 110/60  11/19/21 120/62  11/14/21 (!) 120/58    Wt Readings from Last 3 Encounters:  11/24/21 182 lb 6.4 oz (82.7 kg)  11/19/21 186 lb (84.4 kg)  11/13/21 190 lb (86.2 kg)    Physical Exam Vitals reviewed.  Constitutional:      Appearance: He is not ill-appearing.  HENT:     Mouth/Throat:     Mouth: Mucous membranes are moist.  Eyes:     General: No scleral icterus.    Conjunctiva/sclera: Conjunctivae normal.  Cardiovascular:     Rate and Rhythm: Normal rate and regular rhythm.     Heart sounds: No murmur heard. Pulmonary:     Effort: Pulmonary effort is normal.     Breath sounds: No stridor.  No wheezing, rhonchi or rales.  Abdominal:     General: Abdomen is protuberant. Bowel sounds are decreased. There is no distension.     Palpations: There is no hepatomegaly, splenomegaly or mass.     Tenderness: There is no abdominal tenderness. There is no guarding or rebound.  Musculoskeletal:        General: Normal range of motion.     Cervical back: Neck supple.     Right lower leg: No edema.     Left lower leg: No edema.  Lymphadenopathy:     Cervical: No cervical adenopathy.  Skin:    General: Skin is warm and dry.  Neurological:     General: No focal deficit present.     Mental Status: He is alert.     Lab Results  Component Value  Date   WBC 10.6 (H) 11/19/2021   HGB 12.6 (L) 11/19/2021   HCT 37.2 (L) 11/19/2021   PLT 182.0 11/19/2021   GLUCOSE 214 (H) 11/19/2021   CHOL 125 01/03/2021   TRIG 55.0 01/03/2021   HDL 56.20 01/03/2021   LDLDIRECT 169.8 02/21/2010   LDLCALC 58 01/03/2021   ALT 38 11/13/2021   AST 25 11/13/2021   NA 133 (L) 11/19/2021   K 3.9 11/19/2021   CL 98 11/19/2021   CREATININE 0.81 11/19/2021   BUN 12 11/19/2021   CO2 26 11/19/2021   TSH 2.38 07/08/2020   PSA 0.46 06/22/2018   INR 1.2 11/13/2021   HGBA1C 8.3 (H) 11/19/2021   MICROALBUR <0.7 01/03/2021    US Abdomen Limited RUQ (LIVER/GB)  Result Date: 11/14/2021 CLINICAL DATA:  Abdominal pain EXAM: ULTRASOUND ABDOMEN LIMITED RIGHT UPPER QUADRANT COMPARISON:  None Available. FINDINGS: Gallbladder: A cluster of stones is seen near the gallbladder neck, although evaluation is somewhat limited by overlying bowel gas. No wall thickening or pericholecystic fluid. No sonographic Murphy sign noted by sonographer. Common bile duct: Diameter: 3 mm, within normal limits. No intrahepatic biliary ductal dilatation. Liver: No focal lesion identified. Within normal limits in parenchymal echogenicity. Portal vein is patent on color Doppler imaging with normal direction of blood flow towards the liver. Other: None. IMPRESSION: Cholelithiasis without evidence of acute cholecystitis. Electronically Signed   By: Merilyn Baba M.D.   On: 11/14/2021 00:47   CT ABDOMEN PELVIS W CONTRAST  Result Date: 11/13/2021 CLINICAL DATA:  Acute nonlocalized abdominal pain EXAM: CT ABDOMEN AND PELVIS WITH CONTRAST TECHNIQUE: Multidetector CT imaging of the abdomen and pelvis was performed using the standard protocol following bolus administration of intravenous contrast. RADIATION DOSE REDUCTION: This exam was performed according to the departmental dose-optimization program which includes automated exposure control, adjustment of the mA and/or kV according to patient size  and/or use of iterative reconstruction technique. CONTRAST:  15m OMNIPAQUE IOHEXOL 350 MG/ML SOLN COMPARISON:  MRI abdomen 04/10/2017 FINDINGS: Lower chest: Scarring/atelectasis in the lower lobes. No acute abnormality. Hepatobiliary: Hepatic steatosis. Benign hemangioma in the right hepatic lobe. Cholelithiasis. No evidence of cholecystitis. No biliary dilation. Pancreas: Unremarkable. No pancreatic ductal dilatation or surrounding inflammatory changes. Spleen: Normal in size without focal abnormality. Adrenals/Urinary Tract: Unremarkable adrenal glands. No suspicious renal lesion. No urinary calculi or hydronephrosis. Stomach/Bowel: Stomach is within normal limits. Appendix appears normal. No evidence of bowel wall thickening, distention, or inflammatory changes. There are few colonic diverticula without diverticulitis. Vascular/Lymphatic: Aortic atherosclerosis. No enlarged abdominal or pelvic lymph nodes. Reproductive: Prostate is unremarkable. Other: No free intraperitoneal fluid or air. Musculoskeletal: No acute osseous abnormality. IMPRESSION: No acute  abnormality in the abdomen or pelvis. Hepatic steatosis. Cholelithiasis. Electronically Signed   By: Placido Sou M.D.   On: 11/13/2021 23:47   DG Chest 2 View  Result Date: 11/13/2021 CLINICAL DATA:  sweats.  Chills.  Rule out sepsis.  Diabetes. EXAM: CHEST - 2 VIEW COMPARISON:  None Available. FINDINGS: Midline trachea. Normal heart size. Atherosclerosis in the transverse aorta. No pleural effusion or pneumothorax. Left base scarring or subsegmental atelectasis. No lobar consolidation. IMPRESSION: No acute cardiopulmonary disease. Left base scarring or subsegmental atelectasis. Electronically Signed   By: Abigail Miyamoto M.D.   On: 11/13/2021 13:36    Assessment & Plan:   Fadi was seen today for diabetes and anemia.  Diagnoses and all orders for this visit:  Thrombocytopenia ([place])- His platelets are normal.  He has developed a mild anemia.  B12  and folate are normal. -     Vitamin B12; Future -     CBC with Differential/Platelet; Future -     Folate; Future -     Folate -     CBC with Differential/Platelet -     Vitamin B12  Hypomagnesemia- His magnesium is normal now. -     Magnesium; Future -     Basic metabolic panel; Future -     Basic metabolic panel -     Magnesium  Type 2 diabetes mellitus with manifestations ([place])- His blood sugar is adequately well controlled. -     Basic metabolic panel; Future -     Hemoglobin A1c; Future -     Hemoglobin A1c -     Basic metabolic panel  Deficiency anemia- Will evaluate for vitamin deficiencies. -     IBC + Ferritin; Future -     Vitamin B1; Future -     Zinc; Future -     Reticulocytes; Future   I am having Honor Junes maintain his B-D SINGLE USE SWABS REGULAR, Accu-Chek SmartView Control, accu-chek soft touch, Accu-Chek Aviva Plus, Accu-Chek Aviva Plus, pantoprazole, dapagliflozin propanediol, simvastatin, pioglitazone, and metFORMIN.  No orders of the defined types were placed in this encounter.    Follow-up: Return in about 3 months (around 02/19/2022).  Scarlette Calico, MD

## 2021-11-19 NOTE — Patient Instructions (Signed)
                                                                    [email or web address]

## 2021-11-20 ENCOUNTER — Telehealth: Payer: Self-pay | Admitting: *Deleted

## 2021-11-20 NOTE — Telephone Encounter (Signed)
     Patient  visit on 11/14/2021  at [place] ed  was for diarrhea   Have you been able to follow up with your primary care physician?Went back to dr and just not doing anything for this to come back in yesterday , advised to check in with gastro specialist that he has   The patient was able to obtain any needed medicine or equipment.  Are there diet recommendations that you are having difficulty following?  Patient expresses understanding of discharge instructions and education provided has no other needs at this time.    [place] [phone] [address] , [place] [address] Email : [email or web address]

## 2021-11-24 ENCOUNTER — Ambulatory Visit: Payer: Medicare HMO | Admitting: Physician Assistant

## 2021-11-24 ENCOUNTER — Encounter: Payer: Self-pay | Admitting: Physician Assistant

## 2021-11-24 ENCOUNTER — Other Ambulatory Visit: Payer: Medicare HMO

## 2021-11-24 VITALS — BP 110/60 | HR 95 | Ht 71.0 in | Wt 182.4 lb

## 2021-11-24 DIAGNOSIS — R634 Abnormal weight loss: Secondary | ICD-10-CM | POA: Diagnosis not present

## 2021-11-24 DIAGNOSIS — R630 Anorexia: Secondary | ICD-10-CM

## 2021-11-24 DIAGNOSIS — R197 Diarrhea, unspecified: Secondary | ICD-10-CM | POA: Diagnosis not present

## 2021-11-24 NOTE — Progress Notes (Signed)
Chief Complaint: Diarrhea, fever, weight loss  HPI:    [name] is a 76 year old male with a past medical history as listed below including diabetes and multiple others, known to Dr. Hilarie Fredrickson, who was referred to me by Janith Lima, MD for follow-up after being seen in the ER for diarrhea and fever.    08/08/2019 colonoscopy with one 5 mm polyp in the distal ascending colon, one 9 mm polyp in the distal ascending colon and small internal hemorrhoids.  Pathology showed tubular adenomas and repeat recommended in 5 years.  Though due to age she may not want to proceed with this.    11/13/2021 patient seen in the ER after feeling generally unwell for the past 4 days with persistent diarrhea and subjective fever with chills.  Denied abdominal pain.  Labs with a sodium of 133, lactic acid 2.1, WBC 11.7.  Right upper quadrant ultrasound with cholelithiasis without evidence of acute cholecystitis.  CTAP with contrast with no acute abnormality.  Hepatic steatosis and cholelithiasis.  Noted that on arrival in the ED temperature 102.2 and tachycardic.  He was not hypotensive and otherwise stable.  He did have a low magnesium level which was repleted.    11/19/2021 patient followed with his PCP who ordered further labs.  Repeat CBC showed a decreasing white count from 11.7 in the ER down to 10.6.  Hemoglobin had dropped from 14.2-12.6.  Platelets normal.  BMP with sodium 133.  Magnesium normal.  Hemoglobin A1c 8.3.    Today, the patient presents to clinic accompanied by his wife.  She explains that about 3 weeks ago he developed loose watery, very smelly diarrhea out of the blue which would occur at least 4-5 times a day.  This lasted for about a week and then he started to become nauseous and become very weak and developed a fever at which point they took him to the ER.  He had work-up as above.  The diarrhea lasted for about 2 to 3 days after being seen in the ER and now has stopped.  In fact he had a normal  formed-ish stool on Saturday, 11/22/2021 and has not had a bowel movement since then.  Never had any abdominal pain.  Does still continue with a low-grade fever off-and-on typically worse in the evenings, 99.1/101.  This gets better with the Tylenol.  In general has a decreased appetite and his wife is making him drink chicken broth and Powerade.  He has lost about 8 to 10 pounds since the start of all of this and in general just does not feel like his normal self, still very fatigued and unable to get up and go like he used to.  Symptoms though have all calm down.    Denies hematochezia, heartburn, reflux, sick contacts, change in diet or medications, vomiting or ongoing diarrhea.  Past Medical History:  Diagnosis Date   Diverticulosis    DM (diabetes mellitus) ([place])    Endocarditis, valve unspecified, unspecified cause    Gallstones    GERD (gastroesophageal reflux disease)    Hepatic steatosis    History of rheumatic fever    HLD (hyperlipidemia)    Liver mass    Syncope and collapse    Tubular adenoma of colon     Past Surgical History:  Procedure Laterality Date   COLONOSCOPY  07/05/2014   ESOPHAGOGASTRODUODENOSCOPY     FINGER SURGERY Right    cyst removed middle finger   MOUTH SURGERY  Current Outpatient Medications  Medication Sig Dispense Refill   ACCU-CHEK AVIVA PLUS test strip CHECK BLOOD SUGAR TWICE DAILY 200 strip 2   Alcohol Swabs (B-D SINGLE USE SWABS REGULAR) PADS Use as directed top check blood sugars dx E11.8 100 each 3   Blood Glucose Calibration (ACCU-CHEK SMARTVIEW CONTROL) LIQD Use BID dx e11.8 3 each 3   Blood Glucose Monitoring Suppl (ACCU-CHEK AVIVA PLUS) w/Device KIT USE AS DIRECTED 1 kit 2   dapagliflozin propanediol (FARXIGA) 10 MG TABS tablet Take 1 tablet (10 mg total) by mouth daily before breakfast. 100 tablet 1   Lancets (ACCU-CHEK SOFT TOUCH) lancets Use to check blood sugar twice daily. DX: E11.9 200 each 3   metFORMIN (GLUCOPHAGE) 1000 MG  tablet TAKE 1 TABLET TWICE DAILY WITH MEALS 180 tablet 0   pantoprazole (PROTONIX) 40 MG tablet Take 1 tablet (40 mg total) by mouth daily. **CONTACT OFFICE TO SCHEDULE FOLLOW UP 90 tablet 0   pioglitazone (ACTOS) 30 MG tablet TAKE 1 TABLET EVERY DAY 90 tablet 0   simvastatin (ZOCOR) 20 MG tablet TAKE 1 TABLET AT BEDTIME 90 tablet 0   No current facility-administered medications for this visit.    Allergies as of 11/24/2021   (No Known Allergies)    Family History  Problem Relation Age of Onset   Coronary artery disease Mother        AICD/PACER   Hypertension Mother    Diabetes Mother    Hyperlipidemia Mother    Colon polyps Mother    Coronary artery disease Father        AICD Pacer   Cancer Father 38       ANAL   Colon cancer Maternal Uncle    Stomach cancer Other        materal great uncle and great aunt   Esophageal cancer Sister        1/2 sister   Prostate cancer Neg Hx    Liver disease Neg Hx    Rectal cancer Neg Hx     Social History   Socioeconomic History   Marital status: Married    Spouse name: Not on file   Number of children: 2   Years of education: 12   Highest education level: Not on file  Occupational History   Occupation: Education administrator: RETIRED  Tobacco Use   Smoking status: Former    Packs/day: 0.50    Years: 20.00    Total pack years: 10.00    Types: Cigarettes, Pipe    Quit date: 02/17/1983    Years since quitting: 38.7   Smokeless tobacco: Never   Tobacco comments:    Smoked until 83/ smoked pipe x 5 years  Vaping Use   Vaping Use: Never used  Substance and Sexual Activity   Alcohol use: Yes    Alcohol/week: 2.0 standard drinks of alcohol    Types: 2 Cans of beer per week    Comment: occasional beer    Drug use: No   Sexual activity: Yes  Other Topics Concern   Not on file  Social History Narrative   HSG, community college 2 years. [employer]-3 years, E5 at discharge. Married  '65. 1 son '70, 1 dtr '69; 2 grandchildren. 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   Scientist, physiological Strain: [place]  (07/08/2021)   Overall Financial Resource Strain (CARDIA)    Difficulty of Paying Living Expenses: Not hard at all  Food Insecurity:  No Food Insecurity (07/08/2021)   Hunger Vital Sign    Worried About Running Out of Food in the Last Year: Never true    Ran Out of Food in the Last Year: Never true  Transportation Needs: No Transportation Needs (07/08/2021)   PRAPARE - Hydrologist (Medical): No    Lack of Transportation (Non-Medical): No  Physical Activity: Sufficiently Active (07/08/2021)   Exercise Vital Sign    Days of Exercise per Week: 5 days    Minutes of Exercise per Session: 30 min  Stress: No Stress Concern Present (07/08/2021)   [place]    Feeling of Stress : Not at all  Social Connections: [place] (07/08/2021)   Social Connection and Isolation Panel [NHANES]    Frequency of Communication with Friends and Family: More than three times a week    Frequency of Social Gatherings with Friends and Family: More than three times a week    Attends Religious Services: More than 4 times per year    Active Member of [employer] or Organizations: Yes    Attends Music therapist: More than 4 times per year    Marital Status: Married  Human resources officer Violence: Not At Risk (07/08/2021)   Humiliation, Afraid, Rape, and Kick questionnaire    Fear of Current or Ex-Partner: No    Emotionally Abused: No    Physically Abused: No    Sexually Abused: No    Review of Systems:    Constitutional: See HPI Skin: No rash Cardiovascular: No chest pain Respiratory: No SOB  Gastrointestinal: See HPI and otherwise negative Genitourinary: No dysuria Neurological: No headache, dizziness or syncope Musculoskeletal: +muscle and joint pain Hematologic: No bleeding  Psychiatric: No history of  depression or anxiety   Physical Exam:  Vital signs: BP 110/60   Pulse 95   Ht _0  (1.803 m)   Wt 182 lb 6.4 oz (82.7 kg)   SpO2 95%   BMI 25.44 kg/m    Constitutional:   Pleasant Elderly Caucasian male appears to be in NAD, Well developed, Well nourished, alert and cooperative Head:  Normocephalic and atraumatic. Eyes:   PEERL, EOMI. No icterus. Conjunctiva pink. Ears:  Normal auditory acuity. Neck:  Supple Throat: Oral cavity and pharynx without inflammation, swelling or lesion.  Respiratory: Respirations even and unlabored. Lungs clear to auscultation bilaterally.   No wheezes, crackles, or rhonchi.  Cardiovascular: Normal S1, S2. No MRG. Regular rate and rhythm. No peripheral edema, cyanosis or pallor.  Gastrointestinal:  Soft, nondistended, nontender. No rebound or guarding. Normal bowel sounds. No appreciable masses or hepatomegaly. Rectal:  Not performed.  Msk:  Symmetrical without gross deformities. Without edema, no deformity or joint abnormality.  Neurologic:  Alert and  oriented x4;  grossly normal neurologically.  Skin:   Dry and intact without significant lesions or rashes. Psychiatric:  Demonstrates good judgement and reason without abnormal affect or behaviors.  RELEVANT LABS AND IMAGING: CBC    Component Value Date/Time   WBC 10.6 (H) 11/19/2021 1202   RBC 4.15 (L) 11/19/2021 1202   HGB 12.6 (L) 11/19/2021 1202   HCT 37.2 (L) 11/19/2021 1202   PLT 182.0 11/19/2021 1202   MCV 89.8 11/19/2021 1202   MCH 30.7 11/13/2021 1251   MCHC 34.0 11/19/2021 1202   RDW 14.2 11/19/2021 1202   LYMPHSABS 2.1 11/19/2021 1202   MONOABS 1.4 (H) 11/19/2021 1202   EOSABS 0.1 11/19/2021 1202  BASOSABS 0.1 11/19/2021 1202    CMP     Component Value Date/Time   NA 133 (L) 11/19/2021 1202   K 3.9 11/19/2021 1202   CL 98 11/19/2021 1202   CO2 26 11/19/2021 1202   GLUCOSE 214 (H) 11/19/2021 1202   BUN 12 11/19/2021 1202   CREATININE 0.81 11/19/2021 1202   CALCIUM 9.4  11/19/2021 1202   PROT 7.4 11/13/2021 1251   ALBUMIN 3.9 11/13/2021 1251   AST 25 11/13/2021 1251   ALT 38 11/13/2021 1251   ALKPHOS 80 11/13/2021 1251   BILITOT 0.9 11/13/2021 1251   GFRNONAA >60 11/13/2021 1251    Assessment: 1.  Diarrhea: With fever, decreased appetite and nausea, initially seen in ER with normal CTAP other than cholelithiasis, right upper quadrant ultrasound with cholelithiasis but no cholecystitis, labs with elevated white count and minimally elevated lactic acid, abnormal magnesium, now all improving, patient with no further diarrhea over the past 48 hours, lingering low-grade fever in the evenings and decreased appetite and fatigue; consider most likely viral/bacterial cause of diarrheal illness which is now improving, some lingering IBS symptoms  Plan: 1.  Had a long discussion with the patient and his wife.  She asked if this could be due to gallstones.  It does not look that way from his presentation and imaging.  More than likely this was a bacterial/viral gastroenteritis which brought him down and now he is slowly improving.  Explained that if he is not feeling back to himself in the next 2 to 3 weeks then they should call and let us know.  At that point recommend repeat imaging/labs. 2.  For now continue Powerade/Gatorade and fluids and eating what he can. 3.  Did order stool studies including a GI pathogen panel, fecal lactoferrin and O&P if he has further diarrhea. 4.  Continue Tylenol for nightly fever 5.  Patient to follow in clinic with [place] as needed.    Ellouise Newer, PA-C [facility] 11/24/2021, 8:35 AM  Cc: Janith Lima, MD

## 2021-11-24 NOTE — Patient Instructions (Signed)
Your provider has requested that you go to the basement level for lab work before leaving today. Press "B" on the elevator. The lab is located at the first door on the left as you exit the elevator.  _______________________________________________________  If you are age 78 or older, your body mass index should be between 23-30. Your Body mass index is 25.44 kg/m. If this is out of the aforementioned range listed, please consider follow up with your Primary Care Provider.  If you are age 32 or younger, your body mass index should be between 19-25. Your Body mass index is 25.44 kg/m. If this is out of the aformentioned range listed, please consider follow up with your Primary Care Provider.   ________________________________________________________  The [facility] providers would like to encourage you to use [facility] to communicate with providers for non-urgent requests or questions.  Due to long hold times on the telephone, sending your provider a message by [facility] may be a faster and more efficient way to get a response.  Please allow 48 business hours for a response.  Please remember that this is for non-urgent requests.  _______________________________________________________  Follow up as needed.   Thank you for choosing me and [facility].  Ellouise Newer PA

## 2021-11-25 ENCOUNTER — Telehealth: Payer: Self-pay

## 2021-11-25 NOTE — Telephone Encounter (Signed)
Called and spoke with patient to schedule follow up appt. Pt has been scheduled for a 3-week follow up with Ellouise Newer, PA-C on Wednesday, 12/17/21 at 1:30 pm. Pt verbalized understanding and had no concerns at the end of the call.

## 2021-11-25 NOTE — Progress Notes (Signed)
Addendum: Reviewed and agree with assessment and management plan.  I agree that this was likely infectious enteritis or colitis.  Agree with the stool studies given ongoing issues with low-grade fever. Goal tones were seen but there was no signs of acute cholecystitis by CT or ultrasound He should be seen in short follow-up if symptoms do not completely resolve within the next week Amogh Komatsu, Lajuan Lines, MD

## 2021-11-25 NOTE — Telephone Encounter (Signed)
-----   Message from [place], [place] sent at 11/25/2021  1:28 PM EDT ----- Regarding: follow up Can you make sure patient has follow up in clinic with me in the next 3-4 weeks?  Thanks-JLL ----- Message ----- From: Jerene Bears, MD Sent: 11/25/2021  12:14 PM EDT To: Levin Erp, PA     ----- Message ----- From: Levin Erp, [place] Sent: 11/24/2021   9:22 AM EDT To: Jerene Bears, MD

## 2021-11-27 ENCOUNTER — Encounter (HOSPITAL_COMMUNITY): Payer: Self-pay | Admitting: *Deleted

## 2021-11-27 ENCOUNTER — Other Ambulatory Visit: Payer: Self-pay

## 2021-11-27 ENCOUNTER — Emergency Department (HOSPITAL_COMMUNITY): Payer: Medicare HMO

## 2021-11-27 ENCOUNTER — Emergency Department (HOSPITAL_COMMUNITY)
Admission: EM | Admit: 2021-11-27 | Discharge: 2021-11-27 | Disposition: A | Discharge status: Home / Self Care | Payer: Medicare HMO | Attending: Emergency Medicine | Admitting: Emergency Medicine

## 2021-11-27 DIAGNOSIS — R519 Headache, unspecified: Secondary | ICD-10-CM | POA: Diagnosis not present

## 2021-11-27 DIAGNOSIS — H70009 Acute mastoiditis without complications, unspecified ear: Secondary | ICD-10-CM | POA: Diagnosis not present

## 2021-11-27 DIAGNOSIS — H9042 Sensorineural hearing loss, unilateral, left ear, with unrestricted hearing on the contralateral side: Secondary | ICD-10-CM | POA: Diagnosis not present

## 2021-11-27 DIAGNOSIS — H905 Unspecified sensorineural hearing loss: Secondary | ICD-10-CM

## 2021-11-27 DIAGNOSIS — E119 Type 2 diabetes mellitus without complications: Secondary | ICD-10-CM | POA: Insufficient documentation

## 2021-11-27 DIAGNOSIS — Z7984 Long term (current) use of oral hypoglycemic drugs: Secondary | ICD-10-CM | POA: Insufficient documentation

## 2021-11-27 LAB — COMPREHENSIVE METABOLIC PANEL
ALT: 24 U/L (ref 0–44)
AST: 19 U/L (ref 15–41)
Albumin: 3 g/dL — ABNORMAL LOW (ref 3.5–5.0)
Alkaline Phosphatase: 74 U/L (ref 38–126)
Anion gap: 7 (ref 5–15)
BUN: 13 mg/dL (ref 8–23)
CO2: 25 mmol/L (ref 22–32)
Calcium: 8.6 mg/dL — ABNORMAL LOW (ref 8.9–10.3)
Chloride: 103 mmol/L (ref 98–111)
Creatinine, Ser: 0.77 mg/dL (ref 0.61–1.24)
GFR, Estimated: >60 mL/min (ref >60)
Glucose, Bld: 200 mg/dL — ABNORMAL HIGH (ref 70–99)
Potassium: 4.1 mmol/L (ref 3.5–5.1)
Sodium: 135 mmol/L (ref 135–145)
Total Bilirubin: 0.7 mg/dL (ref 0.3–1.2)
Total Protein: 7.2 g/dL (ref 6.5–8.1)

## 2021-11-27 LAB — CBC WITH DIFFERENTIAL/PLATELET
Abs Immature Granulocytes: 0.02 10*3/uL (ref 0.00–0.07)
Basophils Absolute: 0.1 10*3/uL (ref 0.0–0.1)
Basophils Relative: 1 %
Eosinophils Absolute: 0.1 10*3/uL (ref 0.0–0.5)
Eosinophils Relative: 1 %
HCT: 37.6 % — ABNORMAL LOW (ref 39.0–52.0)
Hemoglobin: 12.3 g/dL — ABNORMAL LOW (ref 13.0–17.0)
Immature Granulocytes: 0 %
Lymphocytes Relative: 15 %
Lymphs Abs: 1.2 10*3/uL (ref 0.7–4.0)
MCH: 29.9 pg (ref 26.0–34.0)
MCHC: 32.7 g/dL (ref 30.0–36.0)
MCV: 91.3 fL (ref 80.0–100.0)
Monocytes Absolute: 0.7 10*3/uL (ref 0.1–1.0)
Monocytes Relative: 9 %
Neutro Abs: 6 10*3/uL (ref 1.7–7.7)
Neutrophils Relative %: 74 %
Platelets: 345 10*3/uL (ref 150–400)
RBC: 4.12 MIL/uL — ABNORMAL LOW (ref 4.22–5.81)
RDW: 13.2 % (ref 11.5–15.5)
WBC: 8 10*3/uL (ref 4.0–10.5)
nRBC: 0 % (ref 0.0–0.2)

## 2021-11-27 LAB — CK: Total CK: 25 U/L — ABNORMAL LOW (ref 49–397)

## 2021-11-27 LAB — MAGNESIUM: Magnesium: 1.8 mg/dL (ref 1.7–2.4)

## 2021-11-27 LAB — LACTIC ACID, PLASMA: Lactic Acid, Venous: 1.1 mmol/L (ref 0.5–1.9)

## 2021-11-27 MED ORDER — METOCLOPRAMIDE HCL 5 MG/ML IJ SOLN
10.0000 mg | Freq: Once | INTRAMUSCULAR | Status: AC
  Administered 2021-11-27: 10 mg via INTRAVENOUS
  Filled 2021-11-27: qty 2

## 2021-11-27 MED ORDER — PREDNISONE 10 MG PO TABS: 60.0000 mg | ORAL_TABLET | Freq: Every day | ORAL | 0 refills | Status: AC

## 2021-11-27 MED ORDER — PREDNISONE 20 MG PO TABS
60.0000 mg | ORAL_TABLET | Freq: Once | ORAL | Status: AC
  Administered 2021-11-27: 60 mg via ORAL
  Filled 2021-11-27: qty 3

## 2021-11-27 MED ORDER — LACTATED RINGERS IV BOLUS
1000.0000 mL | Freq: Once | INTRAVENOUS | Status: AC
  Administered 2021-11-27: 1000 mL via INTRAVENOUS

## 2021-11-27 MED ORDER — IBUPROFEN 200 MG PO TABS
600.0000 mg | ORAL_TABLET | Freq: Once | ORAL | Status: AC
  Administered 2021-11-27: 600 mg via ORAL
  Filled 2021-11-27: qty 3

## 2021-11-27 MED ORDER — ACETAMINOPHEN 500 MG PO TABS
1000.0000 mg | ORAL_TABLET | Freq: Once | ORAL | Status: AC
  Administered 2021-11-27: 1000 mg via ORAL
  Filled 2021-11-27: qty 2

## 2021-11-27 NOTE — ED Provider Triage Note (Signed)
Emergency Medicine Provider Triage Evaluation Note  Angel Rodgers , a 76 y.o. male  was evaluated in triage.  Pt complains of 3 days of posterior headache without fall or injury. Now with left ear pain/fullness, no drainage from ear. Went to [facility]- told small outer ear infection but not enough to be causing this problem Hard of hearing, wife helps with history [facility] on 9/29 for dehydration from diarrhea. Hypo Mg, muscles poisoned.  Review of Systems  Positive: Ear pain, headache Negative: Changes in vision, speech, gait  Physical Exam  BP 133/74 (BP Location: Left Arm)   Pulse (!) 111   Temp 98.4 F (36.9 C) (Oral)   Resp 18   Wt 82.7 kg   SpO2 95%   BMI 25.44 kg/m  Gen:   Awake, no distress   Resp:  Normal effort  MSK:   Moves extremities without difficulty  Other:    Medical Decision Making  Medically screening exam initiated at 12:10 PM.  Appropriate orders placed.  Angel Rodgers was informed that the remainder of the evaluation will be completed by another provider, this initial triage assessment does not replace that evaluation, and the importance of remaining in the ED until their evaluation is complete.     Tacy Learn, PA-C 11/27/21 1212

## 2021-11-27 NOTE — ED Triage Notes (Addendum)
Pt sent from [place], headache and pressure in back of head, generalized weakness. Poor historian.

## 2021-11-27 NOTE — Discharge Instructions (Signed)
You were seen in the emergency department for your headache and hearing loss.  Your work-up showed no obvious signs of infection of your ear or the bone around your ear, no signs of severe dehydration and no signs of stroke or bleeding in your brain as a cause of your symptoms.  We have given you a course of steroids to help with the hearing loss and you should follow-up with an ENT doctor within the next 2 weeks to have your symptoms rechecked.  You should keep a close eye on your blood sugar and check it daily as the steroids can make your blood sugar go high and if your blood sugars are running high you should follow-up with your primary doctor for further management.  You should return to the emergency department if you are having fevers, repetitive vomiting, numbness or weakness on one side of the body compared to the other, neck pain or stiffness or any other new or concerning symptoms.

## 2021-11-27 NOTE — ED Provider Notes (Signed)
[facility] DEPT Provider Note   CSN: [id number] Arrival date & time: 11/27/21  1138     History  Chief Complaint  Patient presents with   Headache   Otalgia    Angel Rodgers is a 76 y.o. male.  Patient is a 76 year old male with a past medical history of diabetes and AAA presenting to the emergency department with a headache.  Patient states over the last 4 days he has had pain in the back of his head and over the last 2 days has radiated more towards the left side of his head.  He states that he has also had decreased hearing in his left ear over the last 2 days.  He denies any pain in his ear or drainage from his ear.  He denies any fevers or chills, nausea or vomiting.  He states he did recently have a diarrheal illness but that is now resolved.  He states he still has had a poor appetite since then.  He denies any other numbness or weakness.  He states he has been taking Tylenol and Motrin for his headaches without significant relief.  He states he was seen at urgent care this morning who recommended that he come to the emergency department for further evaluation.  The history is provided by the patient and the spouse.  Headache Associated symptoms: ear pain   Otalgia Associated symptoms: headaches        Home Medications Prior to Admission medications   Medication Sig Start Date End Date Taking? Authorizing Provider  predniSONE (DELTASONE) 10 MG tablet Take 6 tablets (60 mg total) by mouth daily for 10 days. 11/27/21 12/07/21 Yes Kingsley, Blaine, DO  ACCU-CHEK AVIVA PLUS test strip CHECK BLOOD SUGAR TWICE DAILY 12/04/19   Janith Lima, MD  Alcohol Swabs (B-D SINGLE USE SWABS REGULAR) PADS Use as directed top check blood sugars dx E11.8 10/31/14   Janith Lima, MD  Blood Glucose Calibration (ACCU-CHEK SMARTVIEW CONTROL) LIQD Use BID dx e11.8 10/31/14   Janith Lima, MD  Blood Glucose Monitoring Suppl (ACCU-CHEK AVIVA PLUS) w/Device  KIT USE AS DIRECTED 07/07/18   Janith Lima, MD  dapagliflozin propanediol (FARXIGA) 10 MG TABS tablet Take 1 tablet (10 mg total) by mouth daily before breakfast. 07/16/21   Janith Lima, MD  Lancets (ACCU-CHEK SOFT TOUCH) lancets Use to check blood sugar twice daily. DX: E11.9 04/30/17   Janith Lima, MD  metFORMIN (GLUCOPHAGE) 1000 MG tablet TAKE 1 TABLET TWICE DAILY WITH MEALS 09/03/21   Janith Lima, MD  pantoprazole (PROTONIX) 40 MG tablet Take 1 tablet (40 mg total) by mouth daily. **CONTACT OFFICE TO SCHEDULE FOLLOW UP 04/17/21   Esterwood, Amy S, PA-C  pioglitazone (ACTOS) 30 MG tablet TAKE 1 TABLET EVERY DAY 09/03/21   Janith Lima, MD  simvastatin (ZOCOR) 20 MG tablet TAKE 1 TABLET AT BEDTIME 09/03/21   Janith Lima, MD      Allergies    Patient has no known allergies.    Review of Systems   Review of Systems  HENT:  Positive for ear pain.   Neurological:  Positive for headaches.    Physical Exam Updated Vital Signs BP 121/71   Pulse 99   Temp 98.4 F (36.9 C) (Oral)   Resp 17   Wt 82.7 kg   SpO2 99%   BMI 25.44 kg/m  Physical Exam Vitals and nursing note reviewed.  Constitutional:  General: He is not in acute distress.    Appearance: He is well-developed.  HENT:     Head: Normocephalic and atraumatic.     Right Ear: Tympanic membrane, ear canal and external ear normal. No decreased hearing noted. No tenderness. No mastoid tenderness.     Left Ear: Tympanic membrane and ear canal normal. Decreased hearing noted. Tenderness (Mild tenderness to palpation of left mastoid, no overlying skin changes or swelling, no pain with pulling on the pinnae, no tenderness to palpation of tragus) present. There is no impacted cerumen. No foreign body. There is mastoid tenderness (Mild, no overlying skin changes).     Mouth/Throat:     Mouth: Mucous membranes are moist.     Pharynx: Oropharynx is clear.  Eyes:     General: No visual field deficit.    Extraocular  Movements: Extraocular movements intact.     Pupils: Pupils are equal, round, and reactive to light.  Cardiovascular:     Rate and Rhythm: Normal rate and regular rhythm.     Heart sounds: Normal heart sounds.  Pulmonary:     Effort: Pulmonary effort is normal.     Breath sounds: Normal breath sounds.  Abdominal:     Palpations: Abdomen is soft.     Tenderness: There is no abdominal tenderness.  Musculoskeletal:        General: No swelling. Normal range of motion.     Cervical back: Normal range of motion and neck supple. No rigidity.  Skin:    General: Skin is warm and dry.  Neurological:     Mental Status: He is alert and oriented to person, place, and time.     GCS: GCS eye subscore is 4. GCS verbal subscore is 5. GCS motor subscore is 6.     Cranial Nerves: No cranial nerve deficit, dysarthria or facial asymmetry.     Sensory: No sensory deficit.     Motor: No weakness.     Coordination: Coordination normal.  Psychiatric:        Mood and Affect: Mood normal.        Behavior: Behavior normal.     ED Results / Procedures / Treatments   Labs (all labs ordered are listed, but only abnormal results are displayed) Labs Reviewed  COMPREHENSIVE METABOLIC PANEL - Abnormal; Notable for the following components:      Result Value   Glucose, Bld 200 (*)    Calcium 8.6 (*)    Albumin 3.0 (*)    All other components within normal limits  CBC WITH DIFFERENTIAL/PLATELET - Abnormal; Notable for the following components:   RBC 4.12 (*)    Hemoglobin 12.3 (*)    HCT 37.6 (*)    All other components within normal limits  CK - Abnormal; Notable for the following components:   Total CK 25 (*)    All other components within normal limits  MAGNESIUM  LACTIC ACID, PLASMA    EKG None  Radiology No results found.  Procedures Procedures    Medications Ordered in ED Medications  ibuprofen (ADVIL) tablet 600 mg (has no administration in time range)  predniSONE (DELTASONE) tablet  60 mg (has no administration in time range)  metoCLOPramide (REGLAN) injection 10 mg (10 mg Intravenous Given 11/27/21 1346)  acetaminophen (TYLENOL) tablet 1,000 mg (1,000 mg Oral Given 11/27/21 1346)  lactated ringers bolus 1,000 mL (1,000 mLs Intravenous New Bag/Given 11/27/21 1346)    ED Course/ Medical Decision Making/ A&P Clinical Course as of 11/27/21  5361  Thu Nov 27, 2021  1414 CT with mild degenerative changes of L TMJ otherwise no acute abnormality.  [VK]  1435 Upon reassessment, the patient reported mild improvement of his headache.  On hum test he had louder hearing in the right ear, concerning for new sensorineural hearing loss in the left.  He will be started on a steroid course and was recommended daily blood sugar checks due to his diabetes history was recommended ENT follow-up for further work-up of his hearing loss.  He was given strict return precautions. [VK]    Clinical Course User Index [VK] Kemper Durie, DO                           Medical Decision Making This patient presents to the ED with chief complaint(s) of headache and hearing loss with pertinent past medical history of diabetes which further complicates the presenting complaint. The complaint involves an extensive differential diagnosis and also carries with it a high risk of complications and morbidity.    The differential diagnosis includes ICH, mass effect, no focal neurologic deficits making CVA unlikely, no fevers or meningismus making meningitis unlikely, no evidence of otitis media or externa on exam, mild tenderness to palpation of mastoid concerning for possible mastoiditis  Additional history obtained: Additional history obtained from spouse Records reviewed Primary Care Documents and GI outpatient notes  ED Course and Reassessment: Patient was initially evaluated by provider in triage and had labs as well as CT of head and temporal bone to evaluate for possible causes of his headache and  ear pain.  He was given Reglan and Tylenol for his headache and will be given IV fluids and he will be reassessed.  Patient's medications and he is not currently on any ototoxic medications.  Independent labs interpretation:  The following labs were independently interpreted: Within normal range  Independent visualization of imaging: - I independently visualized the following imaging with scope of interpretation limited to determining acute life threatening conditions related to emergency care: CT head/temporal bone, which revealed no acute disease  Consultation: - Consulted or discussed management/test interpretation w/ external professional: N/A  Consideration for admission or further workup: Stable for discharge home with ENT follow-up further work-up Social Determinants of health: N/A    Amount and/or Complexity of Data Reviewed Labs: ordered.  Risk OTC drugs. Prescription drug management.          Final Clinical Impression(s) / ED Diagnoses Final diagnoses:  Nonintractable headache, unspecified chronicity pattern, unspecified headache type  Sensorineural hearing loss (SNHL) of left ear, unspecified hearing status on contralateral side    Rx / DC Orders ED Discharge Orders          Ordered    predniSONE (DELTASONE) 10 MG tablet  Daily        11/27/21 [place], [place], DO 11/27/21 1443

## 2021-11-29 ENCOUNTER — Encounter: Payer: Self-pay | Admitting: Internal Medicine

## 2021-12-03 ENCOUNTER — Telehealth: Payer: Self-pay

## 2021-12-03 NOTE — Telephone Encounter (Signed)
Patient's wife is calling in stating that Dr.Jones advised Angel Rodgers that he was anemic and to come back within a certain amount of weeks to do a re-test but doesn't know if they need to come in for an appointment or do just labs. Did not see documentation, please advise.

## 2021-12-03 NOTE — Telephone Encounter (Signed)
Barnetta Chapel has been informed labs only.

## 2021-12-04 ENCOUNTER — Other Ambulatory Visit (INDEPENDENT_AMBULATORY_CARE_PROVIDER_SITE_OTHER): Payer: Medicare HMO

## 2021-12-04 DIAGNOSIS — D539 Nutritional anemia, unspecified: Secondary | ICD-10-CM | POA: Diagnosis not present

## 2021-12-04 LAB — IBC + FERRITIN
Ferritin: 112.8 ng/mL (ref 22.0–322.0)
Iron: 84 ug/dL (ref 42–165)
Saturation Ratios: 29.6 % (ref 20.0–50.0)
TIBC: 284.2 ug/dL (ref 250.0–450.0)
Transferrin: 203 mg/dL — ABNORMAL LOW (ref 212.0–360.0)

## 2021-12-09 ENCOUNTER — Other Ambulatory Visit: Payer: Self-pay | Admitting: Internal Medicine

## 2021-12-09 DIAGNOSIS — E519 Thiamine deficiency, unspecified: Secondary | ICD-10-CM

## 2021-12-09 LAB — RETICULOCYTES
ABS Retic: 108960 cells/uL — ABNORMAL HIGH (ref 25000–90000)
Retic Ct Pct: 2.4 %

## 2021-12-09 LAB — VITAMIN B1: Vitamin B1 (Thiamine): 7 nmol/L — ABNORMAL LOW (ref 8–30)

## 2021-12-09 LAB — ZINC: Zinc: 82 ug/dL (ref 60–130)

## 2021-12-09 MED ORDER — VITAMIN B-1 50 MG PO TABS: 50.0000 mg | ORAL_TABLET | Freq: Every day | ORAL | 1 refills | Status: AC

## 2021-12-17 ENCOUNTER — Ambulatory Visit: Payer: Medicare HMO | Admitting: Physician Assistant

## 2021-12-19 DIAGNOSIS — H524 Presbyopia: Secondary | ICD-10-CM | POA: Diagnosis not present

## 2021-12-23 ENCOUNTER — Ambulatory Visit: Payer: Medicare HMO | Admitting: Physician Assistant

## 2022-01-22 DIAGNOSIS — H524 Presbyopia: Secondary | ICD-10-CM | POA: Diagnosis not present

## 2022-01-22 DIAGNOSIS — H5203 Hypermetropia, bilateral: Secondary | ICD-10-CM | POA: Diagnosis not present

## 2022-01-22 DIAGNOSIS — H52209 Unspecified astigmatism, unspecified eye: Secondary | ICD-10-CM | POA: Diagnosis not present

## 2022-03-22 DIAGNOSIS — R051 Acute cough: Secondary | ICD-10-CM | POA: Diagnosis not present

## 2022-03-22 DIAGNOSIS — R0981 Nasal congestion: Secondary | ICD-10-CM | POA: Diagnosis not present

## 2022-03-22 DIAGNOSIS — R509 Fever, unspecified: Secondary | ICD-10-CM | POA: Diagnosis not present

## 2022-03-22 DIAGNOSIS — R519 Headache, unspecified: Secondary | ICD-10-CM | POA: Diagnosis not present

## 2022-04-08 DIAGNOSIS — G2581 Restless legs syndrome: Secondary | ICD-10-CM | POA: Diagnosis not present

## 2022-04-08 DIAGNOSIS — E785 Hyperlipidemia, unspecified: Secondary | ICD-10-CM | POA: Diagnosis not present

## 2022-04-08 DIAGNOSIS — M1990 Unspecified osteoarthritis, unspecified site: Secondary | ICD-10-CM | POA: Diagnosis not present

## 2022-04-08 DIAGNOSIS — K219 Gastro-esophageal reflux disease without esophagitis: Secondary | ICD-10-CM | POA: Diagnosis not present

## 2022-04-08 DIAGNOSIS — E1169 Type 2 diabetes mellitus with other specified complication: Secondary | ICD-10-CM | POA: Diagnosis not present

## 2022-04-08 DIAGNOSIS — Z87891 Personal history of nicotine dependence: Secondary | ICD-10-CM | POA: Diagnosis not present

## 2022-04-08 DIAGNOSIS — G8929 Other chronic pain: Secondary | ICD-10-CM | POA: Diagnosis not present

## 2022-04-14 DIAGNOSIS — Z79899 Other long term (current) drug therapy: Secondary | ICD-10-CM | POA: Diagnosis not present

## 2022-04-14 DIAGNOSIS — I25119 Atherosclerotic heart disease of native coronary artery with unspecified angina pectoris: Secondary | ICD-10-CM | POA: Diagnosis not present

## 2022-04-14 DIAGNOSIS — T82857D Stenosis of cardiac prosthetic devices, implants and grafts, subsequent encounter: Secondary | ICD-10-CM | POA: Diagnosis not present

## 2022-04-14 DIAGNOSIS — E785 Hyperlipidemia, unspecified: Secondary | ICD-10-CM | POA: Diagnosis not present

## 2022-04-14 DIAGNOSIS — I5032 Chronic diastolic (congestive) heart failure: Secondary | ICD-10-CM | POA: Diagnosis not present

## 2022-04-14 DIAGNOSIS — I10 Essential (primary) hypertension: Secondary | ICD-10-CM | POA: Diagnosis not present

## 2022-04-14 DIAGNOSIS — R0602 Shortness of breath: Secondary | ICD-10-CM | POA: Diagnosis not present

## 2022-04-14 DIAGNOSIS — I38 Endocarditis, valve unspecified: Secondary | ICD-10-CM | POA: Diagnosis not present

## 2022-04-29 LAB — HM DIABETES EYE EXAM

## 2022-07-20 ENCOUNTER — Ambulatory Visit (INDEPENDENT_AMBULATORY_CARE_PROVIDER_SITE_OTHER): Payer: HMO | Admitting: Internal Medicine

## 2022-07-20 ENCOUNTER — Encounter: Payer: Self-pay | Admitting: Internal Medicine

## 2022-07-20 VITALS — BP 106/66 | HR 64 | Temp 97.8°F | Ht 71.0 in | Wt 178.0 lb

## 2022-07-20 DIAGNOSIS — K219 Gastro-esophageal reflux disease without esophagitis: Secondary | ICD-10-CM | POA: Diagnosis not present

## 2022-07-20 DIAGNOSIS — E118 Type 2 diabetes mellitus with unspecified complications: Secondary | ICD-10-CM

## 2022-07-20 DIAGNOSIS — D538 Other specified nutritional anemias: Secondary | ICD-10-CM

## 2022-07-20 DIAGNOSIS — R0609 Other forms of dyspnea: Secondary | ICD-10-CM

## 2022-07-20 DIAGNOSIS — Z7984 Long term (current) use of oral hypoglycemic drugs: Secondary | ICD-10-CM

## 2022-07-20 DIAGNOSIS — E785 Hyperlipidemia, unspecified: Secondary | ICD-10-CM | POA: Diagnosis not present

## 2022-07-20 DIAGNOSIS — E519 Thiamine deficiency, unspecified: Secondary | ICD-10-CM | POA: Diagnosis not present

## 2022-07-20 DIAGNOSIS — K7581 Nonalcoholic steatohepatitis (NASH): Secondary | ICD-10-CM

## 2022-07-20 DIAGNOSIS — N182 Chronic kidney disease, stage 2 (mild): Secondary | ICD-10-CM | POA: Diagnosis not present

## 2022-07-20 DIAGNOSIS — Z0001 Encounter for general adult medical examination with abnormal findings: Secondary | ICD-10-CM

## 2022-07-20 DIAGNOSIS — E875 Hyperkalemia: Secondary | ICD-10-CM

## 2022-07-20 LAB — CBC WITH DIFFERENTIAL/PLATELET
Basophils Absolute: 0.1 10*3/uL (ref 0.0–0.1)
Basophils Relative: 1.2 % (ref 0.0–3.0)
Eosinophils Absolute: 0.2 10*3/uL (ref 0.0–0.7)
Eosinophils Relative: 4 % (ref 0.0–5.0)
HCT: 41.2 % (ref 39.0–52.0)
Hemoglobin: 13.2 g/dL (ref 13.0–17.0)
Lymphocytes Relative: 35 % (ref 12.0–46.0)
Lymphs Abs: 2 10*3/uL (ref 0.7–4.0)
MCHC: 32 g/dL (ref 30.0–36.0)
MCV: 91.4 fl (ref 78.0–100.0)
Monocytes Absolute: 0.7 10*3/uL (ref 0.1–1.0)
Monocytes Relative: 11.9 % (ref 3.0–12.0)
Neutro Abs: 2.7 10*3/uL (ref 1.4–7.7)
Neutrophils Relative %: 47.9 % (ref 43.0–77.0)
Platelets: 154 10*3/uL (ref 150.0–400.0)
RBC: 4.51 Mil/uL (ref 4.22–5.81)
RDW: 15.6 % — ABNORMAL HIGH (ref 11.5–15.5)
WBC: 5.7 10*3/uL (ref 4.0–10.5)

## 2022-07-20 LAB — URINALYSIS, ROUTINE W REFLEX MICROSCOPIC
Bilirubin Urine: NEGATIVE
Hgb urine dipstick: NEGATIVE
Ketones, ur: NEGATIVE
Leukocytes,Ua: NEGATIVE
Nitrite: NEGATIVE
RBC / HPF: NONE SEEN (ref 0–?)
Specific Gravity, Urine: 1.015 (ref 1.000–1.030)
Total Protein, Urine: NEGATIVE
Urine Glucose: >=1000 — AB
Urobilinogen, UA: 1 (ref 0.0–1.0)
pH: 7 (ref 5.0–8.0)

## 2022-07-20 LAB — MICROALBUMIN / CREATININE URINE RATIO
Creatinine,U: 64.8 mg/dL
Microalb Creat Ratio: 1.1 mg/g (ref 0.0–30.0)
Microalb, Ur: <0.7 mg/dL (ref 0.0–1.9)

## 2022-07-20 LAB — TSH: TSH: 1.37 u[IU]/mL (ref 0.35–5.50)

## 2022-07-20 LAB — HEMOGLOBIN A1C: Hgb A1c MFr Bld: 7.3 % — ABNORMAL HIGH (ref 4.6–6.5)

## 2022-07-20 LAB — BASIC METABOLIC PANEL
BUN: 19 mg/dL (ref 6–23)
CO2: 28 mEq/L (ref 19–32)
Calcium: 9.3 mg/dL (ref 8.4–10.5)
Chloride: 103 mEq/L (ref 96–112)
Creatinine, Ser: 0.88 mg/dL (ref 0.40–1.50)
GFR: 81.96 mL/min (ref >60.00)
Glucose, Bld: 113 mg/dL — ABNORMAL HIGH (ref 70–99)
Potassium: 5.5 mEq/L — ABNORMAL HIGH (ref 3.5–5.1)
Sodium: 138 mEq/L (ref 135–145)

## 2022-07-20 LAB — BRAIN NATRIURETIC PEPTIDE: Pro B Natriuretic peptide (BNP): 82 pg/mL (ref 0.0–100.0)

## 2022-07-20 NOTE — Patient Instructions (Signed)
Health Maintenance, Male Adopting a healthy lifestyle and getting preventive care are important in promoting health and wellness. Ask your health care provider about: The right schedule for you to have regular tests and exams. Things you can do on your own to prevent diseases and keep yourself healthy. What should I know about diet, weight, and exercise? Eat a healthy diet  Eat a diet that includes plenty of vegetables, fruits, low-fat dairy products, and lean protein. Do not eat a lot of foods that are high in solid fats, added sugars, or sodium. Maintain a healthy weight Body mass index (BMI) is a measurement that can be used to identify possible weight problems. It estimates body fat based on height and weight. Your health care provider can help determine your BMI and help you achieve or maintain a healthy weight. Get regular exercise Get regular exercise. This is one of the most important things you can do for your health. Most adults should: Exercise for at least 150 minutes each week. The exercise should increase your heart rate and make you sweat (moderate-intensity exercise). Do strengthening exercises at least twice a week. This is in addition to the moderate-intensity exercise. Spend less time sitting. Even light physical activity can be beneficial. Watch cholesterol and blood lipids Have your blood tested for lipids and cholesterol at 79 years of age, then have this test every 5 years. You may need to have your cholesterol levels checked more often if: Your lipid or cholesterol levels are high. You are older than 79 years of age. You are at high risk for heart disease. What should I know about cancer screening? Many types of cancers can be detected early and may often be prevented. Depending on your health history and family history, you may need to have cancer screening at various ages. This may include screening for: Colorectal cancer. Prostate cancer. Skin cancer. Lung  cancer. What should I know about heart disease, diabetes, and high blood pressure? Blood pressure and heart disease High blood pressure causes heart disease and increases the risk of stroke. This is more likely to develop in people who have high blood pressure readings or are overweight. Talk with your health care provider about your target blood pressure readings. Have your blood pressure checked: Every 3-5 years if you are 18-39 years of age. Every year if you are 40 years old or older. If you are between the ages of 65 and 75 and are a current or former smoker, ask your health care provider if you should have a one-time screening for abdominal aortic aneurysm (AAA). Diabetes Have regular diabetes screenings. This checks your fasting blood sugar level. Have the screening done: Once every three years after age 45 if you are at a normal weight and have a low risk for diabetes. More often and at a younger age if you are overweight or have a high risk for diabetes. What should I know about preventing infection? Hepatitis B If you have a higher risk for hepatitis B, you should be screened for this virus. Talk with your health care provider to find out if you are at risk for hepatitis B infection. Hepatitis C Blood testing is recommended for: Everyone born from 1945 through 1965. Anyone with known risk factors for hepatitis C. Sexually transmitted infections (STIs) You should be screened each year for STIs, including gonorrhea and chlamydia, if: You are sexually active and are younger than 79 years of age. You are older than 79 years of age and your   health care provider tells you that you are at risk for this type of infection. Your sexual activity has changed since you were last screened, and you are at increased risk for chlamydia or gonorrhea. Ask your health care provider if you are at risk. Ask your health care provider about whether you are at high risk for HIV. Your health care provider  may recommend a prescription medicine to help prevent HIV infection. If you choose to take medicine to prevent HIV, you should first get tested for HIV. You should then be tested every 3 months for as long as you are taking the medicine. Follow these instructions at home: Alcohol use Do not drink alcohol if your health care provider tells you not to drink. If you drink alcohol: Limit how much you have to 0-2 drinks a day. Know how much alcohol is in your drink. In the [place] one drink equals one 12 oz bottle of beer (355 mL), one 5 oz glass of wine (148 mL), or one 1 oz glass of hard liquor (44 mL). Lifestyle Do not use any products that contain nicotine or tobacco. These products include cigarettes, chewing tobacco, and vaping devices, such as e-cigarettes. If you need help quitting, ask your health care provider. Do not use street drugs. Do not share needles. Ask your health care provider for help if you need support or information about quitting drugs. General instructions Schedule regular health, dental, and eye exams. Stay current with your vaccines. Tell your health care provider if: You often feel depressed. You have ever been abused or do not feel safe at home. Summary Adopting a healthy lifestyle and getting preventive care are important in promoting health and wellness. Follow your health care provider's instructions about healthy diet, exercising, and getting tested or screened for diseases. Follow your health care provider's instructions on monitoring your cholesterol and blood pressure. This information is not intended to replace advice given to you by your health care provider. Make sure you discuss any questions you have with your health care provider. Document Revised: 06/24/2020 Document Reviewed: 06/24/2020 [employer] Patient Education  2024 [employer]  

## 2022-07-20 NOTE — Progress Notes (Unsigned)
Subjective:  Patient ID: Angel Rodgers, male    DOB: 1945-08-24  Age: 77 y.o. MRN: [id number]  CC: Annual Exam   HPI Angel Rodgers presents for a CPX and f/up ----  He was infected with COVID about 3 to 4 months ago and since then he has had dyspnea on exertion when he walks long distances.  He denies chest pain, diaphoresis, cough, hemoptysis, palpitations, or edema.  Outpatient Medications Prior to Visit  Medication Sig Dispense Refill   ACCU-CHEK AVIVA PLUS test strip CHECK BLOOD SUGAR TWICE DAILY 200 strip 2   Alcohol Swabs (B-D SINGLE USE SWABS REGULAR) PADS Use as directed top check blood sugars dx E11.8 100 each 3   Blood Glucose Calibration (ACCU-CHEK SMARTVIEW CONTROL) LIQD Use BID dx e11.8 3 each 3   Blood Glucose Monitoring Suppl (ACCU-CHEK AVIVA PLUS) w/Device KIT USE AS DIRECTED 1 kit 2   Lancets (ACCU-CHEK SOFT TOUCH) lancets Use to check blood sugar twice daily. DX: E11.9 200 each 3   thiamine (VITAMIN B-1) 50 MG tablet Take 1 tablet (50 mg total) by mouth daily. 90 tablet 1   dapagliflozin propanediol (FARXIGA) 10 MG TABS tablet Take 1 tablet (10 mg total) by mouth daily before breakfast. 100 tablet 1   metFORMIN (GLUCOPHAGE) 1000 MG tablet TAKE 1 TABLET TWICE DAILY WITH MEALS 180 tablet 0   pantoprazole (PROTONIX) 40 MG tablet Take 1 tablet (40 mg total) by mouth daily. **CONTACT OFFICE TO SCHEDULE FOLLOW UP 90 tablet 0   pioglitazone (ACTOS) 30 MG tablet TAKE 1 TABLET EVERY DAY 90 tablet 0   simvastatin (ZOCOR) 20 MG tablet TAKE 1 TABLET AT BEDTIME 90 tablet 0   No facility-administered medications prior to visit.    ROS Review of Systems  Constitutional: Negative.  Negative for diaphoresis and fatigue.  HENT: Negative.    Eyes: Negative.   Respiratory:  Positive for shortness of breath. Negative for cough, chest tightness and wheezing.   Cardiovascular:  Negative for chest pain, palpitations and leg swelling.  Gastrointestinal:  Negative for abdominal pain,  diarrhea, nausea and vomiting.  Endocrine: Negative.   Genitourinary: Negative.  Negative for difficulty urinating and dysuria.  Musculoskeletal: Negative.   Skin: Negative.   Neurological:  Negative for dizziness, weakness, light-headedness and headaches.  Hematological:  Negative for adenopathy. Does not bruise/bleed easily.  Psychiatric/Behavioral: Negative.      Objective:  BP 106/66 (BP Location: Right Arm, Patient Position: Sitting, Cuff Size: Large)   Pulse 64   Temp 97.8 F (36.6 C) (Oral)   Ht 5\' 11"  (1.803 m)   Wt 178 lb (80.7 kg)   SpO2 96%   BMI 24.83 kg/m   BP Readings from Last 3 Encounters:  07/20/22 106/66  11/27/21 121/71  11/24/21 110/60    Wt Readings from Last 3 Encounters:  07/20/22 178 lb (80.7 kg)  11/27/21 182 lb 6.4 oz (82.7 kg)  11/24/21 182 lb 6.4 oz (82.7 kg)    Physical Exam Vitals reviewed.  HENT:     Nose: Nose normal.     Mouth/Throat:     Mouth: Mucous membranes are moist.  Eyes:     General: No scleral icterus.    Conjunctiva/sclera: Conjunctivae normal.  Cardiovascular:     Rate and Rhythm: Normal rate and regular rhythm.     Pulses: Normal pulses.     Heart sounds: No murmur heard.    No friction rub. No gallop.     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     Breath sounds: No stridor. No wheezing, rhonchi or rales.  Abdominal:     General: Abdomen is flat.     Palpations: There is no mass.     Tenderness: There is no abdominal tenderness. There is no guarding.     Hernia: No hernia is present.  Musculoskeletal:     Cervical back: Neck supple.     Right lower leg: No edema.     Left lower leg: No edema.  Skin:    General: Skin is warm and dry.  Neurological:     General: No focal deficit present.     Mental Status: He is alert. Mental status is at baseline.  Psychiatric:        Mood and Affect: Mood normal.        Behavior:  Behavior normal.     Lab Results  Component Value Date   WBC 5.7 07/20/2022   HGB 13.2 07/20/2022   HCT 41.2 07/20/2022   PLT 154.0 07/20/2022   GLUCOSE 113 (H) 07/20/2022   CHOL 125 01/03/2021   TRIG 55.0 01/03/2021   HDL 56.20 01/03/2021   LDLDIRECT 169.8 02/21/2010   LDLCALC 58 01/03/2021   ALT 24 11/27/2021   AST 19 11/27/2021   NA 138 07/20/2022   K 5.5 (H) 07/20/2022   CL 103 07/20/2022   CREATININE 0.88 07/20/2022   BUN 19 07/20/2022   CO2 28 07/20/2022   TSH 1.37 07/20/2022   PSA 0.46 06/22/2018   INR 1.2 11/13/2021   HGBA1C 7.3 (H) 07/20/2022   MICROALBUR <0.7 07/20/2022    CT Head Wo Contrast  Result Date: 11/27/2021 CLINICAL DATA:  Mastoiditis. EXAM: CT HEAD AND TEMPORAL BONES WITHOUT CONTRAST TECHNIQUE: Contiguous axial images were obtained from the base of the skull through the vertex without contrast. Multidetector CT imaging of the temporal bones was performed using the standard protocol without intravenous contrast. RADIATION DOSE REDUCTION: This exam was performed according to the departmental dose-optimization program which includes automated exposure control, adjustment of the mA and/or kV according to patient size and/or use of iterative reconstruction technique. COMPARISON:  None Available. FINDINGS: CT HEAD FINDINGS Brain: No evidence of acute infarction, hemorrhage, hydrocephalus, extra-axial collection or mass lesion/mass effect. Chronic appearing right cerebellar infarct (series 3, image 9). 2 Vascular: No hyperdense vessel or unexpected calcification. Skull: Normal. Negative for fracture or focal lesion. Sinuses/Orbits: No acute finding. CT TEMPORAL BONES FINDINGS No evidence of mastoid effusion. The septi of the mastoid air cells are intact. Tegmen tympani is intact. Bilateral ossicles are normal in appearance. There is no blunting of the scutum. The epi and hypo tympanum are not fluid-filled. No evidence of middle ear fluid. BIlateral EACs are symmetric  in appearance. Although there are clinical signs of infection in the periauricular soft tissues on the left, no definite asymmetry is seen on this exam to correlate for this finding. Mild asymmetric degenerative change at the left TMJ. IMPRESSION: CT HEAD: 1. No acute intracranial abnormality. 2. Chronic appearing right cerebellar infarct. CT TEMPORAL BONE: 1. No evidence of mastoid effusion or middle ear fluid. 2. Bilateral IAC's are normal in appearance within the limitations of a noncontrast enhanced exam. 3. Mild degenerative changes at the left TMJ. Electronically Signed   By: Lorenza Cambridge M.D.   On: 11/27/2021 13:20   CT Temporal Bones Wo Contrast  Result Date: 11/27/2021 CLINICAL  DATA:  Mastoiditis. EXAM: CT HEAD AND TEMPORAL BONES WITHOUT CONTRAST TECHNIQUE: Contiguous axial images were obtained from the base of the skull through the vertex without contrast. Multidetector CT imaging of the temporal bones was performed using the standard protocol without intravenous contrast. RADIATION DOSE REDUCTION: This exam was performed according to the departmental dose-optimization program which includes automated exposure control, adjustment of the mA and/or kV according to patient size and/or use of iterative reconstruction technique. COMPARISON:  None Available. FINDINGS: CT HEAD FINDINGS Brain: No evidence of acute infarction, hemorrhage, hydrocephalus, extra-axial collection or mass lesion/mass effect. Chronic appearing right cerebellar infarct (series 3, image 9). 2 Vascular: No hyperdense vessel or unexpected calcification. Skull: Normal. Negative for fracture or focal lesion. Sinuses/Orbits: No acute finding. CT TEMPORAL BONES FINDINGS No evidence of mastoid effusion. The septi of the mastoid air cells are intact. Tegmen tympani is intact. Bilateral ossicles are normal in appearance. There is no blunting of the scutum. The epi and hypo tympanum are not fluid-filled. No evidence of middle ear fluid.  BIlateral EACs are symmetric in appearance. Although there are clinical signs of infection in the periauricular soft tissues on the left, no definite asymmetry is seen on this exam to correlate for this finding. Mild asymmetric degenerative change at the left TMJ. IMPRESSION: CT HEAD: 1. No acute intracranial abnormality. 2. Chronic appearing right cerebellar infarct. CT TEMPORAL BONE: 1. No evidence of mastoid effusion or middle ear fluid. 2. Bilateral IAC's are normal in appearance within the limitations of a noncontrast enhanced exam. 3. Mild degenerative changes at the left TMJ. Electronically Signed   By: Lorenza Cambridge M.D.   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 -     Basic metabolic panel; Future -     Urinalysis, Routine w reflex microscopic; Future -     Hemoglobin A1c; Future -     Microalbumin / creatinine urine ratio; Future -     Dapagliflozin Propanediol; Take 1 tablet (10 mg total) by mouth daily before breakfast.  Dispense: 100 tablet; Refill: 1 -     metFORMIN HCl ER; Take 1 tablet (750 mg total) by mouth daily with breakfast.  Dispense: 90 tablet; Refill: 1 -     HM Diabetes Foot Exam  Hyperlipidemia with target LDL less than 70- LDL goal achieved. Doing well on the statin  -     TSH; Future -     Lipid panel; Future -     Simvastatin; Take 1 tablet (20 mg total) by mouth at bedtime.  Dispense: 90 tablet; Refill: 0  DOE (dyspnea on exertion)- EKG is reassuring. -     Brain natriuretic peptide; Future -     EKG 12-Lead  Stage 2 chronic kidney disease -     Dapagliflozin Propanediol; Take 1 tablet (10 mg total) by mouth daily before breakfast.  Dispense: 100 tablet; Refill: 1 -     Basic metabolic panel; Future  Gastroesophageal reflux disease without esophagitis -      Pantoprazole Sodium; Take 1 tablet (40 mg total) by mouth daily.  Dispense: 90 tablet; Refill: 0  Hyperkalemia- Will repeat this. -     Basic metabolic panel; Future  Encounter for general adult medical examination with abnormal findings- Exam completed, labs reviewed, vaccines reviewed and updated, cancer screenings not  indicated, patient education was given.     Follow-up: Return in about 6 months (around 01/19/2023).  Sanda Linger, MD

## 2022-07-21 ENCOUNTER — Other Ambulatory Visit (HOSPITAL_COMMUNITY): Payer: Self-pay

## 2022-07-21 ENCOUNTER — Telehealth: Payer: Self-pay | Admitting: Internal Medicine

## 2022-07-21 DIAGNOSIS — N182 Chronic kidney disease, stage 2 (mild): Secondary | ICD-10-CM | POA: Insufficient documentation

## 2022-07-21 MED ORDER — PANTOPRAZOLE SODIUM 40 MG PO TBEC
40.0000 mg | DELAYED_RELEASE_TABLET | Freq: Every day | ORAL | 0 refills | Status: AC
Start: 2022-07-21 — End: ?
  Filled 2022-07-21 – 2022-07-24 (×3): qty 90, 90d supply, fill #0

## 2022-07-21 MED ORDER — DAPAGLIFLOZIN PROPANEDIOL 10 MG PO TABS
10.0000 mg | ORAL_TABLET | Freq: Every day | ORAL | 1 refills | Status: AC
Start: 2022-07-21 — End: ?
  Filled 2022-07-21 – 2022-07-24 (×3): qty 100, 100d supply, fill #0

## 2022-07-21 MED ORDER — METFORMIN HCL ER 750 MG PO TB24
750.0000 mg | ORAL_TABLET | Freq: Every day | ORAL | 1 refills | Status: DC
Start: 2022-07-21 — End: 2022-07-21

## 2022-07-21 MED ORDER — DAPAGLIFLOZIN PROPANEDIOL 10 MG PO TABS: 10.0000 mg | ORAL_TABLET | Freq: Every day | ORAL | 1 refills | Status: DC

## 2022-07-21 MED ORDER — METFORMIN HCL ER 750 MG PO TB24
750.0000 mg | ORAL_TABLET | Freq: Every day | ORAL | 1 refills | Status: AC
Start: 2022-07-21 — End: ?
  Filled 2022-07-21 – 2022-07-24 (×3): qty 90, 90d supply, fill #0

## 2022-07-21 NOTE — Telephone Encounter (Signed)
Prescription Request  07/21/2022  LOV: 07/20/2022  What is the name of the medication or equipment? metFORMIN (GLUCOPHAGE) 1000 MG tablet dapagliflozin propanediol (FARXIGA) 10 MG TABS tablet  pantoprazole (PROTONIX) 40 MG tablet  thiamine (VITAMIN B-1) 50 MG tablet  ACCU-CHEK AVIVA PLUS test strip    Have you contacted your pharmacy to request a refill? Yes   Which pharmacy would you like this sent to?  [facility]    Patient notified that their request is being sent to the clinical staff for review and that they should receive a response within 2 business days.   Please advise at Mobile [phone] (mobile)

## 2022-07-22 ENCOUNTER — Other Ambulatory Visit (HOSPITAL_COMMUNITY): Payer: Self-pay

## 2022-07-22 DIAGNOSIS — E875 Hyperkalemia: Secondary | ICD-10-CM | POA: Insufficient documentation

## 2022-07-22 MED ORDER — SIMVASTATIN 20 MG PO TABS
20.0000 mg | ORAL_TABLET | Freq: Every day | ORAL | 0 refills | Status: AC
Start: 2022-07-22 — End: ?
  Filled 2022-07-22 – 2022-07-24 (×3): qty 90, 90d supply, fill #0

## 2022-07-24 ENCOUNTER — Other Ambulatory Visit: Payer: Self-pay

## 2022-07-24 ENCOUNTER — Other Ambulatory Visit (HOSPITAL_COMMUNITY): Payer: Self-pay

## 2022-07-27 ENCOUNTER — Other Ambulatory Visit: Payer: Self-pay

## 2022-09-15 ENCOUNTER — Telehealth: Payer: Self-pay | Admitting: Radiology

## 2022-09-15 NOTE — Telephone Encounter (Signed)
Contacted Angel Rodgers to schedule their annual wellness visit. Patient declined to schedule AWV at this time.  Angel Rodgers CMA

## 2022-09-22 DIAGNOSIS — R7989 Other specified abnormal findings of blood chemistry: Secondary | ICD-10-CM | POA: Diagnosis not present

## 2022-09-22 DIAGNOSIS — R42 Dizziness and giddiness: Secondary | ICD-10-CM | POA: Diagnosis not present

## 2022-09-22 DIAGNOSIS — Z8679 Personal history of other diseases of the circulatory system: Secondary | ICD-10-CM | POA: Diagnosis not present

## 2023-01-19 ENCOUNTER — Ambulatory Visit: Payer: HMO | Admitting: Internal Medicine

## 2023-02-05 ENCOUNTER — Other Ambulatory Visit (HOSPITAL_COMMUNITY): Payer: Self-pay
